# Patient Record
Sex: Male | Born: 1940
Health system: Southern US, Community
[De-identification: ages and names within clinical notes are randomized; demographics above are authoritative.]

## PROBLEM LIST (undated history)

## (undated) DIAGNOSIS — I2510 Atherosclerotic heart disease of native coronary artery without angina pectoris: Secondary | ICD-10-CM

## (undated) DIAGNOSIS — K922 Gastrointestinal hemorrhage, unspecified: Secondary | ICD-10-CM

## (undated) DIAGNOSIS — I499 Cardiac arrhythmia, unspecified: Secondary | ICD-10-CM

## (undated) DIAGNOSIS — I10 Essential (primary) hypertension: Secondary | ICD-10-CM

## (undated) DIAGNOSIS — K279 Peptic ulcer, site unspecified, unspecified as acute or chronic, without hemorrhage or perforation: Secondary | ICD-10-CM

## (undated) DIAGNOSIS — N400 Enlarged prostate without lower urinary tract symptoms: Secondary | ICD-10-CM

## (undated) DIAGNOSIS — M109 Gout, unspecified: Secondary | ICD-10-CM

## (undated) DIAGNOSIS — I4891 Unspecified atrial fibrillation: Secondary | ICD-10-CM

## (undated) DIAGNOSIS — K635 Polyp of colon: Secondary | ICD-10-CM

## (undated) DIAGNOSIS — E785 Hyperlipidemia, unspecified: Secondary | ICD-10-CM

## (undated) HISTORY — PX: COLONOSCOPY: SHX174

## (undated) HISTORY — PX: GASTRIC RESECTION: SHX5248

## (undated) HISTORY — PX: CORONARY ANGIOPLASTY: SHX604

## (undated) HISTORY — PX: APPENDECTOMY: SHX54

## (undated) HISTORY — PX: CORONARY ARTERY BYPASS GRAFT: SHX141

## (undated) HISTORY — PX: FRACTURE SURGERY: SHX138

## (undated) HISTORY — PX: CARDIOVERSION: SHX1299

---

## 1993-03-24 HISTORY — PX: FRACTURE SURGERY: SHX138

## 2005-05-16 ENCOUNTER — Ambulatory Visit: Payer: Self-pay | Admitting: Gastroenterology

## 2008-05-31 ENCOUNTER — Ambulatory Visit: Payer: Self-pay | Admitting: Family Medicine

## 2010-03-22 ENCOUNTER — Ambulatory Visit: Payer: Self-pay | Admitting: Family Medicine

## 2010-12-20 ENCOUNTER — Ambulatory Visit: Payer: Self-pay | Admitting: Gastroenterology

## 2012-03-09 DIAGNOSIS — N411 Chronic prostatitis: Secondary | ICD-10-CM | POA: Insufficient documentation

## 2012-07-14 ENCOUNTER — Ambulatory Visit: Payer: Self-pay | Admitting: Family Medicine

## 2013-07-02 ENCOUNTER — Ambulatory Visit: Payer: Self-pay | Admitting: Physician Assistant

## 2013-11-17 DIAGNOSIS — M19049 Primary osteoarthritis, unspecified hand: Secondary | ICD-10-CM | POA: Insufficient documentation

## 2014-10-30 ENCOUNTER — Other Ambulatory Visit: Payer: Self-pay | Admitting: Family Medicine

## 2014-10-30 DIAGNOSIS — I10 Essential (primary) hypertension: Secondary | ICD-10-CM

## 2014-11-08 ENCOUNTER — Ambulatory Visit
Admission: RE | Admit: 2014-11-08 | Discharge: 2014-11-08 | Disposition: A | Discharge status: Home / Self Care | Payer: Medicare Other | Source: Ambulatory Visit | Attending: Internal Medicine | Admitting: Internal Medicine

## 2014-11-08 ENCOUNTER — Encounter
Admission: RE | Disposition: A | Discharge status: Home / Self Care | Payer: Self-pay | Source: Ambulatory Visit | Attending: Internal Medicine

## 2014-11-08 DIAGNOSIS — R0602 Shortness of breath: Secondary | ICD-10-CM | POA: Insufficient documentation

## 2014-11-08 DIAGNOSIS — Z8249 Family history of ischemic heart disease and other diseases of the circulatory system: Secondary | ICD-10-CM | POA: Insufficient documentation

## 2014-11-08 DIAGNOSIS — I209 Angina pectoris, unspecified: Secondary | ICD-10-CM | POA: Diagnosis present

## 2014-11-08 DIAGNOSIS — E785 Hyperlipidemia, unspecified: Secondary | ICD-10-CM | POA: Diagnosis not present

## 2014-11-08 DIAGNOSIS — Z79899 Other long term (current) drug therapy: Secondary | ICD-10-CM | POA: Diagnosis not present

## 2014-11-08 DIAGNOSIS — I25119 Atherosclerotic heart disease of native coronary artery with unspecified angina pectoris: Secondary | ICD-10-CM | POA: Diagnosis not present

## 2014-11-08 DIAGNOSIS — Z882 Allergy status to sulfonamides status: Secondary | ICD-10-CM | POA: Insufficient documentation

## 2014-11-08 DIAGNOSIS — Z7982 Long term (current) use of aspirin: Secondary | ICD-10-CM | POA: Insufficient documentation

## 2014-11-08 DIAGNOSIS — N400 Enlarged prostate without lower urinary tract symptoms: Secondary | ICD-10-CM | POA: Insufficient documentation

## 2014-11-08 DIAGNOSIS — I10 Essential (primary) hypertension: Secondary | ICD-10-CM | POA: Insufficient documentation

## 2014-11-08 DIAGNOSIS — Z809 Family history of malignant neoplasm, unspecified: Secondary | ICD-10-CM | POA: Insufficient documentation

## 2014-11-08 HISTORY — DX: Hyperlipidemia, unspecified: E78.5

## 2014-11-08 HISTORY — PX: CARDIAC CATHETERIZATION: SHX172

## 2014-11-08 HISTORY — DX: Essential (primary) hypertension: I10

## 2014-11-08 HISTORY — DX: Gout, unspecified: M10.9

## 2014-11-08 HISTORY — DX: Benign prostatic hyperplasia without lower urinary tract symptoms: N40.0

## 2014-11-08 HISTORY — DX: Peptic ulcer, site unspecified, unspecified as acute or chronic, without hemorrhage or perforation: K27.9

## 2014-11-08 HISTORY — DX: Polyp of colon: K63.5

## 2014-11-08 SURGERY — LEFT HEART CATH AND CORONARY ANGIOGRAPHY
Anesthesia: Moderate Sedation

## 2014-11-08 MED ORDER — FENTANYL CITRATE (PF) 100 MCG/2ML IJ SOLN
INTRAMUSCULAR | Status: DC | PRN
  Administered 2014-11-08: 50 ug via INTRAVENOUS

## 2014-11-08 MED ORDER — MIDAZOLAM HCL 2 MG/2ML IJ SOLN
INTRAMUSCULAR | Status: DC | PRN
  Administered 2014-11-08: 1 mg via INTRAVENOUS

## 2014-11-08 MED ORDER — FENTANYL CITRATE (PF) 100 MCG/2ML IJ SOLN
INTRAMUSCULAR | Status: AC
  Filled 2014-11-08: qty 2

## 2014-11-08 MED ORDER — SODIUM CHLORIDE 0.9 % IJ SOLN
3.0000 mL | Freq: Two times a day (BID) | INTRAMUSCULAR | Status: DC
  Administered 2014-11-08: 13:00:00 via INTRAVENOUS

## 2014-11-08 MED ORDER — SODIUM CHLORIDE 0.9 % IV SOLN: 250.0000 mL | INTRAVENOUS | Status: DC | PRN

## 2014-11-08 MED ORDER — HEPARIN (PORCINE) IN NACL 2-0.9 UNIT/ML-% IJ SOLN
INTRAMUSCULAR | Status: AC
  Filled 2014-11-08: qty 1000

## 2014-11-08 MED ORDER — MIDAZOLAM HCL 2 MG/2ML IJ SOLN
INTRAMUSCULAR | Status: AC
  Filled 2014-11-08: qty 2

## 2014-11-08 MED ORDER — IOHEXOL 300 MG/ML  SOLN
INTRAMUSCULAR | Status: DC | PRN
  Administered 2014-11-08: 90 mL via INTRA_ARTERIAL
  Administered 2014-11-08: 30 mL via INTRA_ARTERIAL

## 2014-11-08 MED ORDER — SODIUM CHLORIDE 0.9 % IV SOLN
INTRAVENOUS | Status: DC
  Administered 2014-11-08: 13:00:00 via INTRAVENOUS

## 2014-11-08 SURGICAL SUPPLY — 9 items
CATH INFINITI 5FR ANG PIGTAIL (CATHETERS) ×2 IMPLANT
CATH INFINITI 5FR JL4 (CATHETERS) ×2 IMPLANT
CATH INFINITI JR4 5F (CATHETERS) ×2 IMPLANT
DEVICE CLOSURE MYNXGRIP 5F (Vascular Products) ×2 IMPLANT
KIT MANI 3VAL PERCEP (MISCELLANEOUS) ×2 IMPLANT
NEEDLE PERC 18GX7CM (NEEDLE) ×2 IMPLANT
PACK CARDIAC CATH (CUSTOM PROCEDURE TRAY) ×2 IMPLANT
SHEATH AVANTI 5FR X 11CM (SHEATH) ×2 IMPLANT
WIRE EMERALD 3MM-J .035X150CM (WIRE) ×2 IMPLANT

## 2014-11-08 NOTE — Discharge Instructions (Signed)
next two days after your procedure.  Avoid stooping, bending, heavy lifting or exertion as this may put pressure on the insertion site.  Resume normal activities in 48 hours.  You may shower after 24 hours but avoid excessive warm water and do not scrub the site.  Remove clear dressing in 48 hours.  If you have had a closure device inserted, do not soak in a tub bath or a hot tub for at least one week.  No driving for 48 hours after discharge.  After the procedure, check the insertion site occasionally.  If any oozing occurs or there is apparent swelling, firm pressure over the site will prevent a bruise from forming.  You can not hurt anything by pressing directly on the site.  The pressure stops the bleeding by allowing a small clot to form.  If the bleeding continues after the pressure has been applied for more than 15 minutes, call 911 or go to the nearest emergency room.

## 2014-11-22 ENCOUNTER — Encounter: Payer: Self-pay | Admitting: Family Medicine

## 2014-11-22 ENCOUNTER — Ambulatory Visit (INDEPENDENT_AMBULATORY_CARE_PROVIDER_SITE_OTHER): Payer: Medicare Other | Admitting: Family Medicine

## 2014-11-22 VITALS — BP 120/70 | HR 64 | Ht 71.0 in | Wt 157.0 lb

## 2014-11-22 DIAGNOSIS — L259 Unspecified contact dermatitis, unspecified cause: Secondary | ICD-10-CM | POA: Diagnosis not present

## 2014-11-22 DIAGNOSIS — Z23 Encounter for immunization: Secondary | ICD-10-CM

## 2014-11-22 DIAGNOSIS — Z79899 Other long term (current) drug therapy: Secondary | ICD-10-CM | POA: Diagnosis not present

## 2014-11-22 MED ORDER — TRIAMCINOLONE ACETONIDE 0.1 % EX CREA: 1.0000 "application " | TOPICAL_CREAM | Freq: Two times a day (BID) | CUTANEOUS | Status: DC

## 2014-11-22 NOTE — Progress Notes (Signed)
Name: Stuart Jenkins   MRN: [id number]    DOB: Aug 14, 1940   Date:11/22/2014       Progress Note  Subjective  Chief Complaint  Chief Complaint  Patient presents with  . Rash    started about 2 days after starting new med- Imdur- itches, on legs    Rash This is a new problem. The current episode started 1 to 4 weeks ago. The problem is unchanged. The affected locations include the right lowerleg and left lower leg. The rash is characterized by itchiness and redness. He was exposed to a new medication. Pertinent negatives include no anorexia, congestion, cough, diarrhea, eye pain, facial edema, fatigue, fever, joint pain, nail changes, rhinorrhea, shortness of breath, sore throat or vomiting. Past treatments include nothing. The treatment provided no relief. There is no history of allergies, asthma, eczema or varicella.    No problem-specific assessment & plan notes found for this encounter.   Past Medical History  Diagnosis Date  . Gout   . Hyperlipidemia   . Hypertension   . Colon polyp   . BPH (benign prostatic hypertrophy)   . Peptic ulcer disease     Past Surgical History  Procedure Laterality Date  . Appendectomy    . Gastric resection      partial  . Fracture surgery Left     arm, knee, collar bone  . Cardiac catheterization N/A 11/08/2014    Procedure: Left Heart Cath and Coronary Angiography;  Surgeon: Yolonda Kida, MD;  Location: [place] [facility];  Service: Cardiovascular;  Laterality: N/A;    History reviewed. No pertinent family history.  Social History   Social History  . Marital Status: Married    Spouse Name: N/A  . Number of Children: N/A  . Years of Education: N/A   Occupational History  . Not on file.   Social History Main Topics  . Smoking status: Former Smoker    Quit date: 11/07/1984  . Smokeless tobacco: Not on file  . Alcohol Use: Yes     Comment: less than 1 drink/week  . Drug Use: No  . Sexual Activity: Yes   Other Topics  Concern  . Not on file   Social History Narrative    Allergies  Allergen Reactions  . Sulfa Antibiotics      Review of Systems  Constitutional: Negative for fever, chills, weight loss, malaise/fatigue and fatigue.  HENT: Negative for congestion, ear discharge, ear pain, rhinorrhea and sore throat.   Eyes: Negative for blurred vision and pain.  Respiratory: Negative for cough, sputum production, shortness of breath and wheezing.   Cardiovascular: Negative for chest pain, palpitations and leg swelling.  Gastrointestinal: Negative for heartburn, nausea, vomiting, abdominal pain, diarrhea, constipation, blood in stool, melena and anorexia.  Genitourinary: Negative for dysuria, urgency, frequency and hematuria.  Musculoskeletal: Negative for myalgias, back pain, joint pain and neck pain.  Skin: Positive for rash. Negative for nail changes.  Neurological: Negative for dizziness, tingling, sensory change, focal weakness and headaches.  Endo/Heme/Allergies: Negative for environmental allergies and polydipsia. Does not bruise/bleed easily.  Psychiatric/Behavioral: Negative for depression and suicidal ideas. The patient is not nervous/anxious and does not have insomnia.      Objective  Filed Vitals:   11/22/14 0858  BP: 120/70  Pulse: 64  Height: 5\' 11"  (1.803 m)  Weight: 157 lb (71.215 kg)    Physical Exam  Constitutional: He is oriented to person, place, and time and well-developed, well-nourished, and in no distress.  HENT:  Head: Normocephalic.  Right Ear: External ear normal.  Left Ear: External ear normal.  Nose: Nose normal.  Mouth/Throat: Oropharynx is clear and moist.  Eyes: Conjunctivae and EOM are normal. Pupils are equal, round, and reactive to light. Right eye exhibits no discharge. Left eye exhibits no discharge. No scleral icterus.  Neck: Normal range of motion. Neck supple. No JVD present. No tracheal deviation present. No thyromegaly present.  Cardiovascular:  Normal rate, regular rhythm, normal heart sounds and intact distal pulses.  Exam reveals no gallop and no friction rub.   No murmur heard. Pulmonary/Chest: Breath sounds normal. No respiratory distress. He has no wheezes. He has no rales.  Abdominal: Soft. Bowel sounds are normal. He exhibits no mass. There is no hepatosplenomegaly. There is no tenderness. There is no rebound, no guarding and no CVA tenderness.  Musculoskeletal: Normal range of motion. He exhibits no edema or tenderness.  Lymphadenopathy:    He has no cervical adenopathy.  Neurological: He is alert and oriented to person, place, and time. He has normal sensation, normal strength, normal reflexes and intact cranial nerves. No cranial nerve deficit.  Skin: Skin is warm. Rash noted. Rash is maculopapular. There is erythema.     Psychiatric: Mood and affect normal.      Assessment & Plan  Problem List Items Addressed This Visit    None    Visit Diagnoses    Contact dermatitis    -  Primary    possible insect involvement/ zyrtec recommended    Relevant Medications    triamcinolone cream (KENALOG) 0.1 %    Medication course changed        by pt Imdur/may resume    Influenza vaccine needed        Relevant Orders    Flu Vaccine QUAD 36+ mos PF IM (Fluarix & Fluzone Quad PF) (Completed)         Dr. Deanna Jones [facility]  11/22/2014

## 2014-12-05 HISTORY — PX: OTHER SURGICAL HISTORY: SHX169

## 2014-12-06 DIAGNOSIS — F17210 Nicotine dependence, cigarettes, uncomplicated: Secondary | ICD-10-CM | POA: Insufficient documentation

## 2014-12-07 DIAGNOSIS — G8918 Other acute postprocedural pain: Secondary | ICD-10-CM | POA: Insufficient documentation

## 2014-12-23 ENCOUNTER — Emergency Department: Payer: Medicare Other

## 2014-12-23 ENCOUNTER — Observation Stay
Admission: EM | Admit: 2014-12-23 | Discharge: 2014-12-24 | Disposition: A | Discharge status: Home / Self Care | Payer: Medicare Other | Attending: Internal Medicine | Admitting: Internal Medicine

## 2014-12-23 ENCOUNTER — Encounter: Payer: Self-pay | Admitting: Emergency Medicine

## 2014-12-23 DIAGNOSIS — M109 Gout, unspecified: Secondary | ICD-10-CM | POA: Diagnosis not present

## 2014-12-23 DIAGNOSIS — R739 Hyperglycemia, unspecified: Secondary | ICD-10-CM | POA: Diagnosis not present

## 2014-12-23 DIAGNOSIS — Z7901 Long term (current) use of anticoagulants: Secondary | ICD-10-CM | POA: Insufficient documentation

## 2014-12-23 DIAGNOSIS — R000 Tachycardia, unspecified: Secondary | ICD-10-CM | POA: Insufficient documentation

## 2014-12-23 DIAGNOSIS — Z7982 Long term (current) use of aspirin: Secondary | ICD-10-CM | POA: Insufficient documentation

## 2014-12-23 DIAGNOSIS — Z882 Allergy status to sulfonamides status: Secondary | ICD-10-CM | POA: Insufficient documentation

## 2014-12-23 DIAGNOSIS — I4891 Unspecified atrial fibrillation: Secondary | ICD-10-CM

## 2014-12-23 DIAGNOSIS — I2510 Atherosclerotic heart disease of native coronary artery without angina pectoris: Secondary | ICD-10-CM | POA: Diagnosis not present

## 2014-12-23 DIAGNOSIS — R079 Chest pain, unspecified: Secondary | ICD-10-CM | POA: Insufficient documentation

## 2014-12-23 DIAGNOSIS — R42 Dizziness and giddiness: Secondary | ICD-10-CM | POA: Diagnosis not present

## 2014-12-23 DIAGNOSIS — D649 Anemia, unspecified: Secondary | ICD-10-CM | POA: Diagnosis not present

## 2014-12-23 DIAGNOSIS — I517 Cardiomegaly: Secondary | ICD-10-CM | POA: Insufficient documentation

## 2014-12-23 DIAGNOSIS — Z86010 Personal history of colonic polyps: Secondary | ICD-10-CM | POA: Insufficient documentation

## 2014-12-23 DIAGNOSIS — Z885 Allergy status to narcotic agent status: Secondary | ICD-10-CM | POA: Insufficient documentation

## 2014-12-23 DIAGNOSIS — Z951 Presence of aortocoronary bypass graft: Secondary | ICD-10-CM | POA: Insufficient documentation

## 2014-12-23 DIAGNOSIS — R339 Retention of urine, unspecified: Secondary | ICD-10-CM | POA: Diagnosis not present

## 2014-12-23 DIAGNOSIS — Z87891 Personal history of nicotine dependence: Secondary | ICD-10-CM | POA: Insufficient documentation

## 2014-12-23 DIAGNOSIS — N400 Enlarged prostate without lower urinary tract symptoms: Secondary | ICD-10-CM | POA: Diagnosis not present

## 2014-12-23 DIAGNOSIS — E785 Hyperlipidemia, unspecified: Secondary | ICD-10-CM | POA: Insufficient documentation

## 2014-12-23 DIAGNOSIS — J90 Pleural effusion, not elsewhere classified: Secondary | ICD-10-CM | POA: Diagnosis not present

## 2014-12-23 DIAGNOSIS — I959 Hypotension, unspecified: Secondary | ICD-10-CM | POA: Diagnosis not present

## 2014-12-23 DIAGNOSIS — Z8711 Personal history of peptic ulcer disease: Secondary | ICD-10-CM | POA: Diagnosis not present

## 2014-12-23 DIAGNOSIS — R55 Syncope and collapse: Principal | ICD-10-CM | POA: Diagnosis present

## 2014-12-23 DIAGNOSIS — R531 Weakness: Secondary | ICD-10-CM | POA: Diagnosis not present

## 2014-12-23 DIAGNOSIS — Z79899 Other long term (current) drug therapy: Secondary | ICD-10-CM | POA: Diagnosis not present

## 2014-12-23 DIAGNOSIS — N139 Obstructive and reflux uropathy, unspecified: Secondary | ICD-10-CM | POA: Diagnosis not present

## 2014-12-23 DIAGNOSIS — I10 Essential (primary) hypertension: Secondary | ICD-10-CM | POA: Diagnosis not present

## 2014-12-23 DIAGNOSIS — R918 Other nonspecific abnormal finding of lung field: Secondary | ICD-10-CM | POA: Insufficient documentation

## 2014-12-23 DIAGNOSIS — R0602 Shortness of breath: Secondary | ICD-10-CM | POA: Diagnosis not present

## 2014-12-23 DIAGNOSIS — E871 Hypo-osmolality and hyponatremia: Secondary | ICD-10-CM | POA: Diagnosis not present

## 2014-12-23 LAB — CBC WITH DIFFERENTIAL/PLATELET
BASOS ABS: 0 10*3/uL (ref 0–0.1)
BASOS PCT: 1 %
EOS PCT: 3 %
Eosinophils Absolute: 0.1 10*3/uL (ref 0–0.7)
HCT: 31.7 % — ABNORMAL LOW (ref 40.0–52.0)
Hemoglobin: 10.3 g/dL — ABNORMAL LOW (ref 13.0–18.0)
Lymphocytes Relative: 19 %
Lymphs Abs: 0.8 10*3/uL — ABNORMAL LOW (ref 1.0–3.6)
MCH: 29.5 pg (ref 26.0–34.0)
MCHC: 32.6 g/dL (ref 32.0–36.0)
MCV: 90.5 fL (ref 80.0–100.0)
MONO ABS: 0.5 10*3/uL (ref 0.2–1.0)
Monocytes Relative: 12 %
Neutro Abs: 2.8 10*3/uL (ref 1.4–6.5)
Neutrophils Relative %: 65 %
PLATELETS: 384 10*3/uL (ref 150–440)
RBC: 3.51 MIL/uL — ABNORMAL LOW (ref 4.40–5.90)
RDW: 13.8 % (ref 11.5–14.5)
WBC: 4.3 10*3/uL (ref 3.8–10.6)

## 2014-12-23 LAB — BASIC METABOLIC PANEL
ANION GAP: 4 — AB (ref 5–15)
BUN: 14 mg/dL (ref 6–20)
CALCIUM: 7.8 mg/dL — AB (ref 8.9–10.3)
CO2: 24 mmol/L (ref 22–32)
CREATININE: 1.11 mg/dL (ref 0.61–1.24)
Chloride: 106 mmol/L (ref 101–111)
GLUCOSE: 123 mg/dL — AB (ref 65–99)
Potassium: 3.9 mmol/L (ref 3.5–5.1)
Sodium: 134 mmol/L — ABNORMAL LOW (ref 135–145)

## 2014-12-23 LAB — URINALYSIS COMPLETE WITH MICROSCOPIC (ARMC ONLY)
BACTERIA UA: NONE SEEN
Bilirubin Urine: NEGATIVE
GLUCOSE, UA: NEGATIVE mg/dL
Hgb urine dipstick: NEGATIVE
Ketones, ur: NEGATIVE mg/dL
Leukocytes, UA: NEGATIVE
NITRITE: NEGATIVE
Protein, ur: NEGATIVE mg/dL
SPECIFIC GRAVITY, URINE: 1.018 (ref 1.005–1.030)
Squamous Epithelial / LPF: NONE SEEN
pH: 6 (ref 5.0–8.0)

## 2014-12-23 LAB — TROPONIN I: Troponin I: <0.03 ng/mL (ref <0.031)

## 2014-12-23 MED ORDER — ADULT MULTIVITAMIN W/MINERALS CH
1.0000 | ORAL_TABLET | Freq: Every day | ORAL | Status: DC
  Administered 2014-12-24: 1 via ORAL
  Filled 2014-12-23: qty 1

## 2014-12-23 MED ORDER — AMIODARONE HCL 200 MG PO TABS
200.0000 mg | ORAL_TABLET | Freq: Every day | ORAL | Status: DC
  Administered 2014-12-24: 200 mg via ORAL
  Filled 2014-12-23: qty 1

## 2014-12-23 MED ORDER — FERROUS SULFATE 325 (65 FE) MG PO TABS
324.0000 mg | ORAL_TABLET | Freq: Two times a day (BID) | ORAL | Status: DC
  Filled 2014-12-23: qty 1

## 2014-12-23 MED ORDER — FERROUS SULFATE 325 (65 FE) MG PO TABS
325.0000 mg | ORAL_TABLET | Freq: Two times a day (BID) | ORAL | Status: DC
  Administered 2014-12-23 – 2014-12-24 (×2): 325 mg via ORAL
  Filled 2014-12-23: qty 1

## 2014-12-23 MED ORDER — ACETAMINOPHEN 650 MG RE SUPP: 650.0000 mg | Freq: Four times a day (QID) | RECTAL | Status: DC | PRN

## 2014-12-23 MED ORDER — ATORVASTATIN CALCIUM 20 MG PO TABS
40.0000 mg | ORAL_TABLET | Freq: Every day | ORAL | Status: DC
  Administered 2014-12-23: 40 mg via ORAL
  Filled 2014-12-23: qty 2

## 2014-12-23 MED ORDER — RIVAROXABAN 20 MG PO TABS: 20.0000 mg | ORAL_TABLET | Freq: Every day | ORAL | Status: DC

## 2014-12-23 MED ORDER — ENOXAPARIN SODIUM 40 MG/0.4ML ~~LOC~~ SOLN: 40.0000 mg | SUBCUTANEOUS | Status: DC

## 2014-12-23 MED ORDER — ASPIRIN EC 81 MG PO TBEC
81.0000 mg | DELAYED_RELEASE_TABLET | Freq: Every day | ORAL | Status: DC
  Administered 2014-12-24: 81 mg via ORAL
  Filled 2014-12-23: qty 1

## 2014-12-23 MED ORDER — MELATONIN 3 MG PO TABS: 3.0000 mg | ORAL_TABLET | Freq: Every evening | ORAL | Status: DC | PRN

## 2014-12-23 MED ORDER — FOLIC ACID 1 MG PO TABS
1.0000 mg | ORAL_TABLET | Freq: Every day | ORAL | Status: DC
  Administered 2014-12-24: 1 mg via ORAL
  Filled 2014-12-23: qty 1

## 2014-12-23 MED ORDER — ACETAMINOPHEN 325 MG PO TABS: 650.0000 mg | ORAL_TABLET | Freq: Four times a day (QID) | ORAL | Status: DC | PRN

## 2014-12-23 MED ORDER — SODIUM CHLORIDE 0.9 % IV SOLN
INTRAVENOUS | Status: DC
  Administered 2014-12-23: 19:00:00 via INTRAVENOUS

## 2014-12-23 MED ORDER — TAMSULOSIN HCL 0.4 MG PO CAPS
0.8000 mg | ORAL_CAPSULE | Freq: Every day | ORAL | Status: DC
  Administered 2014-12-24: 0.8 mg via ORAL
  Filled 2014-12-23: qty 2

## 2014-12-23 MED ORDER — SODIUM CHLORIDE 0.9 % IJ SOLN: 3.0000 mL | Freq: Two times a day (BID) | INTRAMUSCULAR | Status: DC

## 2014-12-23 NOTE — Progress Notes (Signed)
PHARMACIST - PHYSICIAN ORDER COMMUNICATION  CONCERNING: P&T Medication Policy on Herbal Medications  DESCRIPTION:  This patient's order for:  Melatonin  has been noted.  This product(s) is classified as an "herbal" or natural product. Due to a lack of definitive safety studies or FDA approval, nonstandard manufacturing practices, plus the potential risk of unknown drug-drug interactions while on inpatient medications, the Pharmacy and Therapeutics Committee does not permit the use of "herbal" or natural products of this type within [facility].   ACTION TAKEN: The pharmacy department is unable to verify this order at this time and your patient has been informed of this safety policy. Please reevaluate patient's clinical condition at discharge and address if the herbal or natural product(s) should be resumed at that time.  Paulina Fusi, PharmD, BCPS 12/23/2014 6:47 PM

## 2014-12-23 NOTE — Progress Notes (Signed)
Anticoag Monitoring: Patient is ordered Lovenox 40mg  SQ q24h for DVT prevention. Also ordered Xarelto 20mg  PO daily as continuation of home medication. Per policy, will discontinue the Lovenox as patient is fully anticoagulated on Xarelto.  Paulina Fusi, PharmD, BCPS 12/23/2014 6:50 PM

## 2014-12-23 NOTE — ED Notes (Signed)
2A notified patient is on his way with the ED Nurse Tech

## 2014-12-23 NOTE — ED Notes (Signed)
Pt arrived via EMS from home. Pt states he was walking in his house and felt dizzy and weak and was seeing black spots when walking. Pt reports that he walked over to the table after going to the bathroom and leaned over the table.  When he came to he was still standing on the table and his wife was standing over him yelling.  Pt states he did not fall. Recent CABG at [facility] on 9/13. Foley catheter in place since surgery for chronic retention. Lives at home with wife. Packing noted to right groin as well as midline incision that is dry and intact. Minimal redness.

## 2014-12-23 NOTE — ED Provider Notes (Signed)
[facility] Emergency Department Provider Note  ____________________________________________  Time seen: Seen upon arrival to the emergency department  I have reviewed the triage vital signs and the nursing notes.   HISTORY  Chief Complaint Loss of Consciousness    HPI Stuart Jenkins is a 74 y.o. male with a history of coronary artery bypass grafting several weeks ago at [facility] on September 13 was presenting today with an episode of syncope. He said that he was walking back from the bathroom and started to feel lightheaded. He said that he managed to clean himself over his kitchen table and call for his wife. He said that he woke up with his wife standing over him yelling while he was still lying on the kitchen table. He believes he was unconscious for several seconds. He denies any chest pain. No shortness of breath. On scene he was hypotensive to the 60s which resolved into the 90s by the time he was in the emergency department. He says that he feels back to his baseline. Continues to deny any chest pain or shortness of breath. Dr. Clayborn Bigness is his cardiologist.   Past Medical History  Diagnosis Date  . Gout   . Hyperlipidemia   . Hypertension   . Colon polyp   . BPH (benign prostatic hypertrophy)   . Peptic ulcer disease     There are no active problems to display for this patient.   Past Surgical History  Procedure Laterality Date  . Appendectomy    . Gastric resection      partial  . Fracture surgery Left     arm, knee, collar bone  . Cardiac catheterization N/A 11/08/2014    Procedure: Left Heart Cath and Coronary Angiography;  Surgeon: Yolonda Kida, MD;  Location: [place] [facility];  Service: Cardiovascular;  Laterality: N/A;    Current Outpatient Rx  Name  Route  Sig  Dispense  Refill  . amLODipine (NORVASC) 5 MG tablet      TAKE 1 TABLET BY MOUTH DAILY   30 tablet   1   . aspirin EC 81 MG tablet   Oral   Take 81 mg by mouth  daily.         Marland Kitchen atorvastatin (LIPITOR) 80 MG tablet   Oral   Take 80 mg by mouth daily.         . isosorbide mononitrate (IMDUR) 30 MG 24 hr tablet   Oral   Take 30 mg by mouth daily.         Marland Kitchen losartan (COZAAR) 50 MG tablet      TAKE 1 TABLET BY MOUTH DAILY   30 tablet   1   . tamsulosin (FLOMAX) 0.4 MG CAPS capsule   Oral   Take 0.4 mg by mouth daily.         Marland Kitchen triamcinolone cream (KENALOG) 0.1 %   Topical   Apply 1 application topically 2 (two) times daily.   30 g   2     Allergies Sulfa antibiotics  History reviewed. No pertinent family history.  Social History Social History  Substance Use Topics  . Smoking status: Former Smoker    Quit date: 11/07/1984  . Smokeless tobacco: None  . Alcohol Use: Yes     Comment: less than 1 drink/week    Review of Systems Constitutional: No fever/chills Eyes: No visual changes. ENT: No sore throat. Cardiovascular: Denies chest pain. Respiratory: Denies shortness of breath. Gastrointestinal: No abdominal pain.  No nausea, no vomiting.  No diarrhea.  No constipation. Genitourinary: Negative for dysuria. Musculoskeletal: Negative for back pain. Skin: Negative for rash. Neurological: Negative for headaches, focal weakness or numbness.  10-point ROS otherwise negative.  ____________________________________________   PHYSICAL EXAM:  VITAL SIGNS: ED Triage Vitals  Enc Vitals Group     BP 12/23/14 1530 91/59 mmHg     Pulse Rate 12/23/14 1530 89     Resp 12/23/14 1530 21     Temp 12/23/14 1530 97.5 F (36.4 C)     Temp Source 12/23/14 1530 Oral     SpO2 12/23/14 1530 100 %     Weight 12/23/14 1530 161 lb (73.029 kg)     Height 12/23/14 1530 5\' 11"  (1.803 m)     Head Cir --      Peak Flow --      Pain Score 12/23/14 1532 0     Pain Loc --      Pain Edu? --      Excl. in [place]? --     Constitutional: Alert and oriented. Well appearing and in no acute distress. Eyes: Conjunctivae are normal. PERRL.  EOMI. Head: Atraumatic. Nose: No congestion/rhinnorhea. Mouth/Throat: Mucous membranes are moist.  Oropharynx non-erythematous. Neck: No stridor.   Cardiovascular: Normal rate, regular rhythm. Grossly normal heart sounds.  Good peripheral circulation. Respiratory: Normal respiratory effort.  No retractions. Lungs CTAB. Gastrointestinal: Soft and nontender. No distention. No abdominal bruits. No CVA tenderness. Musculoskeletal: No lower extremity tenderness nor edema.  No joint effusions. Neurologic:  Normal speech and language. No gross focal neurologic deficits are appreciated. No gait instability. Skin:  Skin is warm, dry and intact. Well healing midline sternotomy scar. Also well-healing incisions to the vein graft to the right thigh. Psychiatric: Mood and affect are normal. Speech and behavior are normal.  ____________________________________________   LABS (all labs ordered are listed, but only abnormal results are displayed)  Labs Reviewed  CBC WITH DIFFERENTIAL/PLATELET - Abnormal; Notable for the following:    RBC 3.51 (*)    Hemoglobin 10.3 (*)    HCT 31.7 (*)    Lymphs Abs 0.8 (*)    All other components within normal limits  BASIC METABOLIC PANEL - Abnormal; Notable for the following:    Sodium 134 (*)    Glucose, Bld 123 (*)    Calcium 7.8 (*)    Anion gap 4 (*)    All other components within normal limits  TROPONIN I   ____________________________________________  EKG  ED ECG REPORT I, Doran Stabler, the attending physician, personally viewed and interpreted this ECG.   Date: 12/23/2014  EKG Time: 1524  Rate: 83  Rhythm: atrial fibrillation, rate 83  Axis: Normal axis  Intervals:none  ST&T Change: No ST segment elevation or depression. T-wave inversion in 2, 3 and aVF. No previous for comparison on the chart.  ____________________________________________  RADIOLOGY  Small area of infiltrate at the left  base. ____________________________________________   PROCEDURES   ____________________________________________   INITIAL IMPRESSION / ASSESSMENT AND PLAN / ED COURSE  Pertinent labs & imaging results that were available during my care of the patient were reviewed by me and considered in my medical decision making (see chart for details).  ----------------------------------------- 4:14 PM on 12/23/2014 -----------------------------------------  Patient resting comfortably at this time. Blood pressure continues to be in the 90s. Continues to be without any pain at this time. We'll bring him to the hospital for observation for acute syncopal episode. Is  not a low-risk patient secondary to recent cardiac bypass surgery as well as hypotensive on the scene. Signed out to Dr. Wynetta Emery. Discussed the plan as well as lab and imaging results with the patient. He was red evident infiltrate on his left side. However, he says that he also had a recent pneumothorax on that side. He denies any cough or fever at home. He has no clinical signs of pneumonia or lung infection. I believe this is likely due to his recent procedures. Will not start on antibiotics. ____________________________________________   FINAL CLINICAL IMPRESSION(S) / ED DIAGNOSES  Acute syncope. Initial visit.    Orbie Pyo, MD 12/23/14 [phone]

## 2014-12-23 NOTE — H&P (Signed)
Stuart Jenkins is an 74 y.o. male.   Chief Complaint: Passing out HPI: Presents after passing out at home. Says he got up to go to the bathroom and felt dizzy. On his way back he felt worse then the next thing he remembers is waking up on the floor with his wife yelling his name. No chest pain, no shortness of breath. He recently had GABG at [facility] about 3 weeks ago. He has had a foley in since for urinary retension.  Past Medical History  Diagnosis Date  . Gout   . Hyperlipidemia   . Hypertension   . Colon polyp   . BPH (benign prostatic hypertrophy)   . Peptic ulcer disease     Past Surgical History  Procedure Laterality Date  . Appendectomy    . Gastric resection      partial  . Fracture surgery Left     arm, knee, collar bone  . Cardiac catheterization N/A 11/08/2014    Procedure: Left Heart Cath and Coronary Angiography;  Surgeon: Yolonda Kida, MD;  Location: [place] [facility];  Service: Cardiovascular;  Laterality: N/A;    History reviewed. No pertinent family history.  Positive for CAD  Social History:  reports that he quit smoking about 30 years ago. He does not have any smokeless tobacco history on file. He reports that he drinks alcohol. He reports that he does not use illicit drugs.  Allergies:  Allergies  Allergen Reactions  . Sulfa Antibiotics Rash     (Not in a hospital admission)  Results for orders placed or performed during the hospital encounter of 12/23/14 (from the past 48 hour(s))  CBC with Differential     Status: Abnormal   Collection Time: 12/23/14  3:33 PM  Result Value Ref Range   WBC 4.3 3.8 - 10.6 K/uL   RBC 3.51 (L) 4.40 - 5.90 MIL/uL   Hemoglobin 10.3 (L) 13.0 - 18.0 g/dL   HCT 31.7 (L) 40.0 - 52.0 %   MCV 90.5 80.0 - 100.0 fL   MCH 29.5 26.0 - 34.0 pg   MCHC 32.6 32.0 - 36.0 g/dL   RDW 13.8 11.5 - 14.5 %   Platelets 384 150 - 440 K/uL   Neutrophils Relative % 65 %   Neutro Abs 2.8 1.4 - 6.5 K/uL   Lymphocytes Relative 19 %    Lymphs Abs 0.8 (L) 1.0 - 3.6 K/uL   Monocytes Relative 12 %   Monocytes Absolute 0.5 0.2 - 1.0 K/uL   Eosinophils Relative 3 %   Eosinophils Absolute 0.1 0 - 0.7 K/uL   Basophils Relative 1 %   Basophils Absolute 0.0 0 - 0.1 K/uL  Basic metabolic panel     Status: Abnormal   Collection Time: 12/23/14  3:33 PM  Result Value Ref Range   Sodium 134 (L) 135 - 145 mmol/L   Potassium 3.9 3.5 - 5.1 mmol/L   Chloride 106 101 - 111 mmol/L   CO2 24 22 - 32 mmol/L   Glucose, Bld 123 (H) 65 - 99 mg/dL   BUN 14 6 - 20 mg/dL   Creatinine, Ser 1.11 0.61 - 1.24 mg/dL   Calcium 7.8 (L) 8.9 - 10.3 mg/dL   GFR calc non Af Amer >60 >60 mL/min   GFR calc Af Amer >60 >60 mL/min    Comment: (NOTE) The eGFR has been calculated using the CKD EPI equation. This calculation has not been validated in all clinical situations. eGFR's persistently <60 mL/min  signify possible Chronic Kidney Disease.    Anion gap 4 (L) 5 - 15  Troponin I     Status: None   Collection Time: 12/23/14  3:33 PM  Result Value Ref Range   Troponin I <0.03 <0.031 ng/mL    Comment:        NO INDICATION OF MYOCARDIAL INJURY.    Dg Chest 1 View  12/23/2014   CLINICAL DATA:  Dizziness and syncope  EXAM: CHEST 1 VIEW  COMPARISON:  March 22, 2010  FINDINGS: There is a small area of infiltrate in the left base. Lungs elsewhere clear. Heart size and pulmonary vascularity are within normal limits. Patient is status post coronary artery bypass grafting. No adenopathy. There is an old healed fracture of the left clavicle. There are surgical clips at the gastroesophageal junction region.  IMPRESSION: Small area of infiltrate left base. Lungs otherwise clear. Followup PA and lateral chest radiographs recommended in 3-4 weeks following trial of antibiotic therapy to ensure resolution and exclude underlying malignancy.   Electronically Signed   By: Lowella Grip III M.D.   On: 12/23/2014 15:55    Review of Systems  Constitutional:  Negative for fever and chills.  HENT: Negative for hearing loss.   Eyes: Negative for blurred vision.  Respiratory: Negative for shortness of breath.   Cardiovascular: Positive for chest pain.  Gastrointestinal: Negative for nausea, vomiting and diarrhea.  Genitourinary: Negative for dysuria.  Musculoskeletal: Negative for back pain and joint pain.  Skin: Negative for rash.  Neurological: Negative for sensory change.    Blood pressure 91/59, pulse 89, temperature 97.5 F (36.4 C), temperature source Oral, resp. rate 21, height _0  (1.803 m), weight 73.029 kg (161 lb), SpO2 100 %. Physical Exam  Constitutional: He is oriented to person, place, and time. He appears well-developed and well-nourished. No distress.  HENT:  Head: Normocephalic and atraumatic.  Mouth/Throat: Oropharynx is clear and moist. No oropharyngeal exudate.  Eyes: EOM are normal. Pupils are equal, round, and reactive to light. No scleral icterus.  Neck: Neck supple. No JVD present. No tracheal deviation present. No thyromegaly present.  Cardiovascular: Regular rhythm.   No murmur heard. Respiratory:  Clear to ascultation No dullness to percussion No use of accessary muscles Recent sternal incisional scar with scabs  GI: Soft. Bowel sounds are normal. He exhibits no mass. There is no tenderness.  Musculoskeletal: He exhibits no edema.  Post op scar down right thigh with packed wound at upper groin area. No exudate.  Lymphadenopathy:    He has no cervical adenopathy.  Neurological: He is alert and oriented to person, place, and time. No cranial nerve deficit.  Skin: Skin is warm and dry. No rash noted. No erythema.     Assessment/Plan 1. Syncope: Suspect vasovagal. However, BP slow to recovery and he is still mildly hypertensive. Will give IVF, hold metoprolol and ambulate in the morning. Will also consult his cardiologist.  2. Hypotension: Holding metoprolol. IVF. Do not see any reason pt would be developing  sepsis, no signs.  3. CAD: S/P CABG. Continue meds but hold metoprolol as above.  4. Urinary Retention: Has foley cath. Check UA.  5. Infiltrate? Portable CXR was read as possible LLL infiltrate. Pt has no other corelating symptoms. Will follow up with PA and Lateral view. Will hold off on abx for now.  Reviewed past medical records.  Time spent= 6mn..Edwina Barth  Malavika Lira D 12/23/2014, 5:05 PM

## 2014-12-24 ENCOUNTER — Observation Stay: Payer: Medicare Other

## 2014-12-24 DIAGNOSIS — D649 Anemia, unspecified: Secondary | ICD-10-CM

## 2014-12-24 DIAGNOSIS — E871 Hypo-osmolality and hyponatremia: Secondary | ICD-10-CM

## 2014-12-24 DIAGNOSIS — I959 Hypotension, unspecified: Secondary | ICD-10-CM

## 2014-12-24 DIAGNOSIS — I4891 Unspecified atrial fibrillation: Secondary | ICD-10-CM

## 2014-12-24 DIAGNOSIS — R739 Hyperglycemia, unspecified: Secondary | ICD-10-CM

## 2014-12-24 LAB — HEMOGLOBIN A1C: Hgb A1c MFr Bld: 5.5 % (ref 4.0–6.0)

## 2014-12-24 LAB — SODIUM: SODIUM: 136 mmol/L (ref 135–145)

## 2014-12-24 LAB — HEMOGLOBIN: Hemoglobin: 9.9 g/dL — ABNORMAL LOW (ref 13.0–18.0)

## 2014-12-24 NOTE — Progress Notes (Signed)
Pt is a&o, VSS, Afib on tele with no complaints of pain or discomfort. Pt ambulated without difficulty and orthostatic negative. Order to D/C pt to home. Discharge instructions given to pt with verbal acknowledgment of understanding. IV and tele removed. PT complained that papers with meds and appointments were left in ED. Called down to ED and they expressed that they did not have any papers of the pt. Pt escorted off unit via wheelchair by nursing.

## 2014-12-24 NOTE — Progress Notes (Signed)
[facility] [place] NAME: Talis Iwan    MR#:  [id number]  DATE OF BIRTH:  09/25/1940  SUBJECTIVE:  CHIEF COMPLAINT:   Chief Complaint  Patient presents with  . Loss of Consciousness   Mr. Stuart Jenkins is a 74 year old Caucasian male with past medical history significant for history of coronary artery disease who underwent coronary artery bypass grafting at the beginning of September 2016 at [facility]. Postoperatively, according to patient's recollection, he had an episode of atrial fibrillation for which she was initiated on amiodarone and metoprolol as well as Xarelto. He presented to the hospital on 12/23/2014 with syncopal episode. He was noted to have hypotension on arrival to emergency room. Patient admits of feeling dizzy just prior to syncopal episode. EKG in the emergency room revealed atrial fibrillation, tachycardic at 110. His cardiac enzymes were negative. Patient admits of some shortness of breath on exertion but no chest pains.  Review of Systems  Constitutional: Negative for fever, chills and weight loss.  HENT: Negative for congestion.   Eyes: Negative for blurred vision and double vision.  Respiratory: Negative for cough, sputum production, shortness of breath and wheezing.   Cardiovascular: Negative for chest pain, palpitations, orthopnea, leg swelling and PND.  Gastrointestinal: Negative for nausea, vomiting, abdominal pain, diarrhea, constipation and blood in stool.  Genitourinary: Negative for dysuria, urgency, frequency and hematuria.  Musculoskeletal: Negative for falls.  Neurological: Negative for dizziness, tremors, focal weakness and headaches.  Endo/Heme/Allergies: Does not bruise/bleed easily.  Psychiatric/Behavioral: Negative for depression. The patient does not have insomnia.     VITAL SIGNS: Blood pressure 99/65, pulse 108, temperature 97.9 F (36.6 C), temperature source Oral, resp.  rate 18, height 5\' 11"  (1.803 m), weight 65.454 kg (144 lb 4.8 oz), SpO2 98 %.  PHYSICAL EXAMINATION:   GENERAL:  74 y.o.-year-old patient lying in the bed with no acute distress.  EYES: Pupils equal, round, reactive to light and accommodation. No scleral icterus. Extraocular muscles intact.  HEENT: Head atraumatic, normocephalic. Oropharynx and nasopharynx clear.  NECK:  Supple, no jugular venous distention. No thyroid enlargement, no tenderness.  LUNGS: Normal breath sounds bilaterally, no wheezing, rales,rhonchi or crepitation. No use of accessory muscles of respiration.  CARDIOVASCULAR: S1, S2 , irregularly irregular. No murmurs, rubs, or gallops.  ABDOMEN: Soft, nontender, nondistended. Bowel sounds present. No organomegaly or mass.  EXTREMITIES: No pedal edema, cyanosis, or clubbing.  NEUROLOGIC: Cranial nerves II through XII are intact. Muscle strength 5/5 in all extremities. Sensation intact. Gait not checked.  PSYCHIATRIC: The patient is alert and oriented x 3.  SKIN: No obvious rash, lesion, or ulcer.   ORDERS/RESULTS REVIEWED:   CBC  Recent Labs Lab 12/23/14 1533  WBC 4.3  HGB 10.3*  HCT 31.7*  PLT 384  MCV 90.5  MCH 29.5  MCHC 32.6  RDW 13.8  LYMPHSABS 0.8*  MONOABS 0.5  EOSABS 0.1  BASOSABS 0.0   ------------------------------------------------------------------------------------------------------------------  Chemistries   Recent Labs Lab 12/23/14 1533  NA 134*  K 3.9  CL 106  CO2 24  GLUCOSE 123*  BUN 14  CREATININE 1.11  CALCIUM 7.8*   ------------------------------------------------------------------------------------------------------------------ estimated creatinine clearance is 54.9 mL/min (by C-G formula based on Cr of 1.11). ------------------------------------------------------------------------------------------------------------------ No results for input(s): TSH, T4TOTAL, T3FREE, THYROIDAB in the last 72 hours.  Invalid input(s):  FREET3  Cardiac Enzymes  Recent Labs Lab 12/23/14 1533  TROPONINI <0.03   ------------------------------------------------------------------------------------------------------------------ Invalid input(s): POCBNP ---------------------------------------------------------------------------------------------------------------  RADIOLOGY: Dg  Chest 1 View  12/23/2014   CLINICAL DATA:  Dizziness and syncope  EXAM: CHEST 1 VIEW  COMPARISON:  March 22, 2010  FINDINGS: There is a small area of infiltrate in the left base. Lungs elsewhere clear. Heart size and pulmonary vascularity are within normal limits. Patient is status post coronary artery bypass grafting. No adenopathy. There is an old healed fracture of the left clavicle. There are surgical clips at the gastroesophageal junction region.  IMPRESSION: Small area of infiltrate left base. Lungs otherwise clear. Followup PA and lateral chest radiographs recommended in 3-4 weeks following trial of antibiotic therapy to ensure resolution and exclude underlying malignancy.   Electronically Signed   By: Lowella Grip III M.D.   On: 12/23/2014 15:55   X-ray Chest Pa And Lateral  12/24/2014   CLINICAL DATA:  Acute chest pain and near syncope. CABG 3 weeks ago.  EXAM: CHEST  2 VIEW  COMPARISON:  12/23/2014 and 03/22/2010 chest radiographs  FINDINGS: Cardiomegaly and CABG changes again noted.  Small bilateral pleural effusions, left-greater-than-right, are noted.  There is no evidence of focal airspace disease, pulmonary edema, suspicious pulmonary nodule/mass or pneumothorax. No acute bony abnormalities are identified.  IMPRESSION: Cardiomegaly, CABG changes and small bilateral pleural effusions, left-greater-than-right.   Electronically Signed   By: Margarette Canada M.D.   On: 12/24/2014 09:40    EKG:  Orders placed or performed during the hospital encounter of 12/23/14  . EKG 12-Lead  . EKG 12-Lead    ASSESSMENT AND PLAN:  Active Problems:    Syncopal episodes  1. Syncope, unclear etiology at this time, although could be related to intolerance of atrial fibrillation and hypertension. Getting orthostatic vital signs. Awaiting for cardiology consultation.  2. Hypotension, likely due to medications, although atrial fibrillation could be blamed as well, off metoprolol at present, checking orthostatic vital signs. Ambulate and follow symptoms 3. A. fib. Continue amiodarone as well as Xarelto. May need to undergo electrical conversion, awaiting for cardiology's consultation 4. Hyponatremia. Follow sodium level today 5. Anemia, followed with rehydration 6. Hyperglycemia. Check hemoglobin A1c   Management plans discussed with the patient, family and they are in agreement.   DRUG ALLERGIES:  Allergies  Allergen Reactions  . Oxycodone Hcl Other (See Comments)    Altered mental status  . Sulfa Antibiotics Rash    CODE STATUS:     Code Status Orders        Start     Ordered   12/23/14 1841  Full code   Continuous     12/23/14 1840      TOTAL TIME TAKING CARE OF THIS PATIENT: 45 minutes.    Theodoro Grist M.D on 12/24/2014 at 10:20 AM  Between 7am to 6pm - Pager - [phone]  After 6pm go to [email or web address] - password [email or web address] [facility]  [facility]  Office  [phone]  CC: Primary care physician; Otilio Miu, MD

## 2014-12-24 NOTE — Progress Notes (Signed)
A&O. Up with one assist. IVF infusing. Foley draining well. Offered to replace leg bag with our facility drainage bags but patient refused. Afib on tele,.

## 2014-12-24 NOTE — Consult Note (Signed)
Reason for Consult: syncope hypotension AFib Referring Physician:   physician hospitalist  HABIB KISE is an 74 y.o. male.  HPI:  Patient known coronary disease history of angina recently had coronary bypass surgery.  Had postoperative atrial fibrillation placed on and then around and Xarelto. Bypass surgery standard [facility] December 08, 2014. Patient also had postoperative collapse along which required a chest tube. Patient also had urinary obstruction requiring a Foley he was discharged home with a leg bag and is to follow up with Urology. Patient states that he has had weakness vertigo fatigued low blood pressure so he came to emergency room for evaluation after a passing out episode. Patient denies any chest pain no shortness of breath denies any leg edema. No nausea vomiting or diarrhea he just complains of generalized weakness denies palpitations or tachycardia so he presented for further evaluation because of in the last weakness. The patient is placement times lucent because of urinary obstruction is on a significant dose of 0.8. No seizure activity was noted. No bleeding. Episode of syncope was preceded by lightheadedness weakness dizziness. He still feels weak and dizzy but does okay while supine.  Past Medical History  Diagnosis Date  . Gout   . Hyperlipidemia   . Hypertension   . Colon polyp   . BPH (benign prostatic hypertrophy)   . Peptic ulcer disease     Past Surgical History  Procedure Laterality Date  . Appendectomy    . Gastric resection      partial  . Fracture surgery Left     arm, knee, collar bone  . Cardiac catheterization N/A 11/08/2014    Procedure: Left Heart Cath and Coronary Angiography;  Surgeon: Yolonda Kida, MD;  Location: [facility];  Service: Cardiovascular;  Laterality: N/A;    History reviewed. No pertinent family history.  Social History:  reports that he quit smoking about 30 years ago. He does not have any smokeless tobacco  history on file. He reports that he drinks alcohol. He reports that he does not use illicit drugs.  Allergies:  Allergies  Allergen Reactions  . Oxycodone Hcl Other (See Comments)    Altered mental status  . Sulfa Antibiotics Rash    Medications: I have reviewed the patient's current medications.  Results for orders placed or performed during the hospital encounter of 12/23/14 (from the past 48 hour(s))  CBC with Differential     Status: Abnormal   Collection Time: 12/23/14  3:33 PM  Result Value Ref Range   WBC 4.3 3.8 - 10.6 K/uL   RBC 3.51 (L) 4.40 - 5.90 MIL/uL   Hemoglobin 10.3 (L) 13.0 - 18.0 g/dL   HCT 31.7 (L) 40.0 - 52.0 %   MCV 90.5 80.0 - 100.0 fL   MCH 29.5 26.0 - 34.0 pg   MCHC 32.6 32.0 - 36.0 g/dL   RDW 13.8 11.5 - 14.5 %   Platelets 384 150 - 440 K/uL   Neutrophils Relative % 65 %   Neutro Abs 2.8 1.4 - 6.5 K/uL   Lymphocytes Relative 19 %   Lymphs Abs 0.8 (L) 1.0 - 3.6 K/uL   Monocytes Relative 12 %   Monocytes Absolute 0.5 0.2 - 1.0 K/uL   Eosinophils Relative 3 %   Eosinophils Absolute 0.1 0 - 0.7 K/uL   Basophils Relative 1 %   Basophils Absolute 0.0 0 - 0.1 K/uL  Basic metabolic panel     Status: Abnormal   Collection Time: 12/23/14  3:33 PM  Result Value Ref Range   Sodium 134 (L) 135 - 145 mmol/L   Potassium 3.9 3.5 - 5.1 mmol/L   Chloride 106 101 - 111 mmol/L   CO2 24 22 - 32 mmol/L   Glucose, Bld 123 (H) 65 - 99 mg/dL   BUN 14 6 - 20 mg/dL   Creatinine, Ser 1.11 0.61 - 1.24 mg/dL   Calcium 7.8 (L) 8.9 - 10.3 mg/dL   GFR calc non Af Amer >60 >60 mL/min   GFR calc Af Amer >60 >60 mL/min    Comment: (NOTE) The eGFR has been calculated using the CKD EPI equation. This calculation has not been validated in all clinical situations. eGFR's persistently <60 mL/min signify possible Chronic Kidney Disease.    Anion gap 4 (L) 5 - 15  Troponin I     Status: None   Collection Time: 12/23/14  3:33 PM  Result Value Ref Range   Troponin I <0.03  <0.031 ng/mL    Comment:        NO INDICATION OF MYOCARDIAL INJURY.   Urinalysis complete, with microscopic ([facility] only)     Status: Abnormal   Collection Time: 12/23/14  5:55 PM  Result Value Ref Range   Color, Urine YELLOW (A) YELLOW   APPearance CLEAR (A) CLEAR   Glucose, UA NEGATIVE NEGATIVE mg/dL   Bilirubin Urine NEGATIVE NEGATIVE   Ketones, ur NEGATIVE NEGATIVE mg/dL   Specific Gravity, Urine 1.018 1.005 - 1.030   Hgb urine dipstick NEGATIVE NEGATIVE   pH 6.0 5.0 - 8.0   Protein, ur NEGATIVE NEGATIVE mg/dL   Nitrite NEGATIVE NEGATIVE   Leukocytes, UA NEGATIVE NEGATIVE   RBC / HPF 0-5 0 - 5 RBC/hpf   WBC, UA 0-5 0 - 5 WBC/hpf   Bacteria, UA NONE SEEN NONE SEEN   Squamous Epithelial / LPF NONE SEEN NONE SEEN   Mucous PRESENT   Sodium     Status: None   Collection Time: 12/24/14 10:59 AM  Result Value Ref Range   Sodium 136 135 - 145 mmol/L  Hemoglobin     Status: Abnormal   Collection Time: 12/24/14 10:59 AM  Result Value Ref Range   Hemoglobin 9.9 (L) 13.0 - 18.0 g/dL    Dg Chest 1 View  12/23/2014   CLINICAL DATA:  Dizziness and syncope  EXAM: CHEST 1 VIEW  COMPARISON:  March 22, 2010  FINDINGS: There is a small area of infiltrate in the left base. Lungs elsewhere clear. Heart size and pulmonary vascularity are within normal limits. Patient is status post coronary artery bypass grafting. No adenopathy. There is an old healed fracture of the left clavicle. There are surgical clips at the gastroesophageal junction region.  IMPRESSION: Small area of infiltrate left base. Lungs otherwise clear. Followup PA and lateral chest radiographs recommended in 3-4 weeks following trial of antibiotic therapy to ensure resolution and exclude underlying malignancy.   Electronically Signed   By: Lowella Grip III M.D.   On: 12/23/2014 15:55   X-ray Chest Pa And Lateral  12/24/2014   CLINICAL DATA:  Acute chest pain and near syncope. CABG 3 weeks ago.  EXAM: CHEST  2 VIEW  COMPARISON:   12/23/2014 and 03/22/2010 chest radiographs  FINDINGS: Cardiomegaly and CABG changes again noted.  Small bilateral pleural effusions, left-greater-than-right, are noted.  There is no evidence of focal airspace disease, pulmonary edema, suspicious pulmonary nodule/mass or pneumothorax. No acute bony abnormalities are identified.  IMPRESSION: Cardiomegaly, CABG changes and  small bilateral pleural effusions, left-greater-than-right.   Electronically Signed   By: Margarette Canada M.D.   On: 12/24/2014 09:40    Review of Systems  Constitutional: Positive for malaise/fatigue.  Eyes: Positive for blurred vision.  Respiratory: Negative.   Cardiovascular: Negative.   Gastrointestinal: Negative.   Genitourinary: Positive for dysuria and urgency.  Musculoskeletal: Negative.   Skin: Negative.   Neurological: Positive for dizziness, weakness and headaches.  Endo/Heme/Allergies: Negative.   Psychiatric/Behavioral: Negative.    Blood pressure 115/77, pulse 117, temperature 98.1 F (36.7 C), temperature source Oral, resp. rate 18, height _0  (1.803 m), weight 65.454 kg (144 lb 4.8 oz), SpO2 99 %. Physical Exam  Nursing note and vitals reviewed. Constitutional: He is oriented to person, place, and time. He appears well-developed and well-nourished.  Eyes: Conjunctivae are normal. Pupils are equal, round, and reactive to light.  Neck: Normal range of motion. Neck supple.  Cardiovascular: Normal rate, S1 normal and S2 normal.  An irregularly irregular rhythm present.  Murmur heard.  Systolic murmur is present with a grade of 2/6  Respiratory: Effort normal and breath sounds normal.  GI: Soft. Bowel sounds are normal.  Musculoskeletal: Normal range of motion.  Neurological: He is alert and oriented to person, place, and time. He has normal reflexes.  Skin: Skin is warm and dry.  Psychiatric: He has a normal mood and affect.    Assessment/Plan: Syncope  vertigo  hypertension  atrial fib  coronary  disease  anemia  postop CABG at  [facility]  December 08, 2014  hypernatremia  weakness  angina  coronary disease  urinary obstruction  hyperlipidemia . PLAN  agreed with admission on telemetry  for rule out for myocardial infarction  recommend hydration gently  consider reducing tamsulosin to 0.7m qd  continue Xarelto for atrial fibrillation anticoagulation  maintain amiodarone for rhythm control  agree with aspirin 81 mg a day  follow up hemoglobin continue iron  continue foley for urinary obstruction  orthostatic blood pressures  ambulation with assistance If patient no longer orthostatic 2 more will consider early discharged and follow up as an outpatient   CALLWOOD,DWAYNE D. 12/24/2014, 1:02 PM

## 2014-12-24 NOTE — Progress Notes (Signed)
Subjective:   patient states to feel somewhat better reduced vertigo but he has not been up walking yet. No palpitations or tachycardia no chest pain. His weakness and lightheadedness of somewhat improved.  Objective:  Vital Signs in the last 24 hours: Temp:  [97.5 F (36.4 C)-98.3 F (36.8 C)] 98.1 F (36.7 C) (10/02 1236) Pulse Rate:  [69-117] 117 (10/02 1242) Resp:  [14-28] 18 (10/02 0924) BP: (91-117)/(62-84) 115/77 mmHg (10/02 1242) SpO2:  [85 %-100 %] 99 % (10/02 1236) Weight:  [65.454 kg (144 lb 4.8 oz)-73.029 kg (161 lb)] 65.454 kg (144 lb 4.8 oz) (10/01 1840)  Intake/Output from previous day: 10/01 0701 - 10/02 0700 In: 1173.3 [I.V.:1173.3] Out: 900 [Urine:900] Intake/Output from this shift: Total I/O In: 480 [P.O.:480] Out: 500 [Urine:500]  Physical Exam: General appearance: cooperative and appears stated age Neck: no adenopathy, no carotid bruit, no JVD, supple, symmetrical, trachea midline and thyroid not enlarged, symmetric, no tenderness/mass/nodules Lungs: clear to auscultation bilaterally Heart: irregularly irregular rhythm, S1, S2 normal and no S3 or S4 Abdomen: soft, non-tender; bowel sounds normal; no masses,  no organomegaly Extremities: extremities normal, atraumatic, no cyanosis or edema Pulses: 2+ and symmetric Skin: Skin color, texture, turgor normal. No rashes or lesions Neurologic: Alert and oriented X 3, normal strength and tone. Normal symmetric reflexes. Normal coordination and gait  Lab Results:  Recent Labs  12/23/14 1533 12/24/14 1059  WBC 4.3  --   HGB 10.3* 9.9*  PLT 384  --     Recent Labs  12/23/14 1533 12/24/14 1059  NA 134* 136  K 3.9  --   CL 106  --   CO2 24  --   GLUCOSE 123*  --   BUN 14  --   CREATININE 1.11  --     Recent Labs  12/23/14 1533  TROPONINI <0.03   Hepatic Function Panel No results for input(s): PROT, ALBUMIN, AST, ALT, ALKPHOS, BILITOT, BILIDIR, IBILI in the last 72 hours. No results for  input(s): CHOL in the last 72 hours. No results for input(s): PROTIME in the last 72 hours.  Imaging: Imaging results have been reviewed  Cardiac Studies:  Assessment/Plan:  Angina Arrhythmia Atrial Fibrillation CABG Coronary Artery Disease Syncope   hypotension  weakness  vertigo  anemia . PLAN  continue to  telemetry for AFib  And syncope  vertigo improved consider meclizine  hypotension continue to hold metoprolol  continue amiodarone for atrial fibrillation  agree with Xarelto for anticoagulation  continue gentle hydration  recheck orthostatics blood pressures  if the patient not orthostatic and  Ambulatory without vertigo or lightheadedness would recommend early discharge follow up with Cardiology this week  reduce  tamsulosin  to 0.4 mg a day  continue Foley bag until seen by Urology     Ndeye Tenorio D. 12/24/2014, 1:40 PM

## 2014-12-24 NOTE — Discharge Summary (Signed)
[facility] [place] NAME: Stuart Jenkins    MR#:  [id number]  DATE OF BIRTH:  14-Aug-1940  DATE OF ADMISSION:  12/23/2014 ADMITTING PHYSICIAN: Baxter Hire, MD  DATE OF DISCHARGE: No discharge date for patient encounter.  PRIMARY CARE PHYSICIAN: Otilio Miu, MD     ADMISSION DIAGNOSIS:  Syncope and collapse [R55]  DISCHARGE DIAGNOSIS:  Principal Problem:   Syncopal episodes Active Problems:   Hypotension   Hyponatremia   Anemia   Atrial fibrillation ([place])   Hyperglycemia   SECONDARY DIAGNOSIS:   Past Medical History  Diagnosis Date  . Gout   . Hyperlipidemia   . Hypertension   . Colon polyp   . BPH (benign prostatic hypertrophy)   . Peptic ulcer disease     .pro HOSPITAL COURSE:  Stuart Jenkins is a 74 year old Caucasian male with past medical history significant for history of coronary artery disease who underwent coronary artery bypass grafting at the beginning of September 2016 at [facility]. Postoperatively, according to patient's recollection, he had an episode of atrial fibrillation for which she was initiated on amiodarone and metoprolol as well as Xarelto. He presented to the hospital on 12/23/2014 with syncopal episode. He was noted to have hypotension on arrival to emergency room. Patient admits of feeling dizzy just prior to syncopal episode. EKG in the emergency room revealed atrial fibrillation, tachycardic at 110. His cardiac enzymes were negative. Patient admitted of some shortness of breath on exertion but no chest pains, which is chronic. He was noted to be hyponatremic and given IV fluids, and blood pressure medications were placed on hold. With this his blood pressure improved, he was ambulated and had no symptoms. He was seen by cardiologist, Dr. Clayborn Bigness who recommended outpatient follow-up and discontinuation,  at least temporarily,  of his blood pressure medications. Discussion  by problem 1. Syncope, unclear etiology at this time, likely element of dehydration as patient presented to hyponatremia,  given IV fluids , although could be related to intolerance of atrial fibrillation and hypotension. Normal orthostatic vital signs. Appreciate cardiology input, suspected due to dehydration. Recommended. Ambulate patient. Watch for symptoms, and discharge him home with cardiology follow-up in the next 1 week.  2. Hypotension, likely due to medications, although atrial fibrillation could be blamed as well, discontinue all blood pressure medications , at least temporarily until a patient is seen by primary care physician and/or cardiologist, unremarkable orthostatic vital signs. Ambulate and follow symptoms, likely discharge home if asymptomatic and blood pressure remained stable with ambulation 3. A. fib. Continue amiodarone as well as Xarelto.  4. Hyponatremia. Patient has been rehydrated with normal saline intravenously . Follow sodium level today 5. Anemia, followed with rehydration 6. Hyperglycemia. Check hemoglobin A1c  DISCHARGE CONDITIONS:   Stable  CONSULTS OBTAINED:  Treatment Team:  Yolonda Kida, MD  DRUG ALLERGIES:   Allergies  Allergen Reactions  . Oxycodone Hcl Other (See Comments)    Altered mental status  . Sulfa Antibiotics Rash    DISCHARGE MEDICATIONS:   Current Discharge Medication List    CONTINUE these medications which have NOT CHANGED   Details  amiodarone (PACERONE) 200 MG tablet Take 200 mg by mouth daily. Refills: 0    aspirin EC 81 MG tablet Take 81 mg by mouth daily.    atorvastatin (LIPITOR) 40 MG tablet Take 40 mg by mouth at bedtime. Refills: 0    ferrous sulfate 324 (65 FE) MG TBEC  Take 324 mg by mouth 2 (two) times daily.    folic acid (FOLVITE) 1 MG tablet Take 1 mg by mouth daily. X 14 days.    Melatonin 3 MG TABS Take 3 mg by mouth at bedtime as needed. For sleep.    Multiple Vitamin (MULTI-VITAMINS) TABS Take 1  tablet by mouth daily.    tamsulosin (FLOMAX) 0.4 MG CAPS capsule Take 0.8 mg by mouth daily.     XARELTO 20 MG TABS tablet Take 20 mg by mouth daily. Refills: 0    triamcinolone cream (KENALOG) 0.1 % Apply 1 application topically 2 (two) times daily. Qty: 30 g, Refills: 2   Associated Diagnoses: Contact dermatitis      STOP taking these medications     metoprolol succinate (TOPROL-XL) 25 MG 24 hr tablet      amLODipine (NORVASC) 5 MG tablet      losartan (COZAAR) 50 MG tablet          DISCHARGE INSTRUCTIONS:    Patient is to follow-up with primary care physician, Dr. Otilio Miu and cardiologist, Dr. Clayborn Bigness  If you experience worsening of your admission symptoms, develop shortness of breath, life threatening emergency, suicidal or homicidal thoughts you must seek medical attention immediately by calling 911 or calling your MD immediately  if symptoms less severe.  You Must read complete instructions/literature along with all the possible adverse reactions/side effects for all the Medicines you take and that have been prescribed to you. Take any new Medicines after you have completely understood and accept all the possible adverse reactions/side effects.   Please note  You were cared for by a hospitalist during your hospital stay. If you have any questions about your discharge medications or the care you received while you were in the hospital after you are discharged, you can call the unit and asked to speak with the hospitalist on call if the hospitalist that took care of you is not available. Once you are discharged, your primary care physician will handle any further medical issues. Please note that NO REFILLS for any discharge medications will be authorized once you are discharged, as it is imperative that you return to your primary care physician (or establish a relationship with a primary care physician if you do not have one) for your aftercare needs so that they can  reassess your need for medications and monitor your lab values.    Today   CHIEF COMPLAINT:   Chief Complaint  Patient presents with  . Loss of Consciousness    HISTORY OF PRESENT ILLNESS:  Stuart Jenkins  is a 74 y.o. male with a known history of coronary artery disease who underwent coronary artery bypass grafting at the beginning of September 2016 at [facility]. Postoperatively, according to patient's recollection, he had an episode of atrial fibrillation for which she was initiated on amiodarone and metoprolol as well as Xarelto. He presented to the hospital on 12/23/2014 with syncopal episode. He was noted to have hypotension on arrival to emergency room. Patient admits of feeling dizzy just prior to syncopal episode. EKG in the emergency room revealed atrial fibrillation, tachycardic at 110. His cardiac enzymes were negative. Patient admitted of some shortness of breath on exertion but no chest pains, which is chronic. He was noted to be hyponatremic and given IV fluids, and blood pressure medications were placed on hold. With this his blood pressure improved, he was ambulated and had no symptoms. He was seen by cardiologist, Dr. Clayborn Bigness  who recommended outpatient follow-up and discontinuation,  at least temporarily,  of his blood pressure medications. Discussion by problem 1. Syncope, unclear etiology at this time, likely element of dehydration as patient presented to hyponatremia,  given IV fluids , although could be related to intolerance of atrial fibrillation and hypotension. Normal orthostatic vital signs. Appreciate cardiology input, suspected due to dehydration. Recommended. Ambulate patient. Watch for symptoms, and discharge him home with cardiology follow-up in the next 1 week.  2. Hypotension, likely due to medications, although atrial fibrillation could be blamed as well, discontinue all blood pressure medications , at least temporarily until a patient is  seen by primary care physician and/or cardiologist, unremarkable orthostatic vital signs. Ambulate and follow symptoms, likely discharge home if asymptomatic and blood pressure remained stable with ambulation 3. A. fib. Continue amiodarone as well as Xarelto.  4. Hyponatremia. Patient has been rehydrated with normal saline intravenously . Follow sodium level today 5. Anemia, followed with rehydration 6. Hyperglycemia. Check hemoglobin A1c    VITAL SIGNS:  Blood pressure 99/65, pulse 108, temperature 97.9 F (36.6 C), temperature source Oral, resp. rate 18, height 5\' 11"  (1.803 m), weight 65.454 kg (144 lb 4.8 oz), SpO2 98 %.  I/O:   Intake/Output Summary (Last 24 hours) at 12/24/14 1202 Last data filed at 12/24/14 0942  Gross per 24 hour  Intake 1413.33 ml  Output   1400 ml  Net  13.33 ml    PHYSICAL EXAMINATION:  GENERAL:  74 y.o.-year-old patient lying in the bed with no acute distress.  EYES: Pupils equal, round, reactive to light and accommodation. No scleral icterus. Extraocular muscles intact.  HEENT: Head atraumatic, normocephalic. Oropharynx and nasopharynx clear.  NECK:  Supple, no jugular venous distention. No thyroid enlargement, no tenderness.  LUNGS: Normal breath sounds bilaterally, no wheezing, rales,rhonchi or crepitation. No use of accessory muscles of respiration.  CARDIOVASCULAR: S1, S2 , irregularly irregular. No murmurs, rubs, or gallops.  ABDOMEN: Soft, non-tender, non-distended. Bowel sounds present. No organomegaly or mass.  EXTREMITIES: No pedal edema, cyanosis, or clubbing.  NEUROLOGIC: Cranial nerves II through XII are intact. Muscle strength 5/5 in all extremities. Sensation intact. Gait not checked.  PSYCHIATRIC: The patient is alert and oriented x 3.  SKIN: No obvious rash, lesion, or ulcer.   DATA REVIEW:   CBC  Recent Labs Lab 12/23/14 1533 12/24/14 1059  WBC 4.3  --   HGB 10.3* 9.9*  HCT 31.7*  --   PLT 384  --     Chemistries    Recent Labs Lab 12/23/14 1533  NA 134*  K 3.9  CL 106  CO2 24  GLUCOSE 123*  BUN 14  CREATININE 1.11  CALCIUM 7.8*    Cardiac Enzymes  Recent Labs Lab 12/23/14 1533  [place] <0.03    Microbiology Results  No results found for this or any previous visit.  RADIOLOGY:  Dg Chest 1 View  12/23/2014   CLINICAL DATA:  Dizziness and syncope  EXAM: CHEST 1 VIEW  COMPARISON:  March 22, 2010  FINDINGS: There is a small area of infiltrate in the left base. Lungs elsewhere clear. Heart size and pulmonary vascularity are within normal limits. Patient is status post coronary artery bypass grafting. No adenopathy. There is an old healed fracture of the left clavicle. There are surgical clips at the gastroesophageal junction region.  IMPRESSION: Small area of infiltrate left base. Lungs otherwise clear. Followup PA and lateral chest radiographs recommended in 3-4 weeks following trial of antibiotic  therapy to ensure resolution and exclude underlying malignancy.   Electronically Signed   By: Lowella Grip III M.D.   On: 12/23/2014 15:55   X-ray Chest Pa And Lateral  12/24/2014   CLINICAL DATA:  Acute chest pain and near syncope. CABG 3 weeks ago.  EXAM: CHEST  2 VIEW  COMPARISON:  12/23/2014 and 03/22/2010 chest radiographs  FINDINGS: Cardiomegaly and CABG changes again noted.  Small bilateral pleural effusions, left-greater-than-right, are noted.  There is no evidence of focal airspace disease, pulmonary edema, suspicious pulmonary nodule/mass or pneumothorax. No acute bony abnormalities are identified.  IMPRESSION: Cardiomegaly, CABG changes and small bilateral pleural effusions, left-greater-than-right.   Electronically Signed   By: Margarette Canada M.D.   On: 12/24/2014 09:40    EKG:   Orders placed or performed during the hospital encounter of 12/23/14  . EKG 12-Lead  . EKG 12-Lead      Management plans discussed with the patient, family and they are in agreement.  CODE STATUS:      Code Status Orders        Start     Ordered   12/23/14 1841  Full code   Continuous     12/23/14 1840      TOTAL TIME TAKING CARE OF THIS PATIENT: 45 minutes.  Discussed with Dr. Forde Radon M.D on 12/24/2014 at 12:02 PM  Between 7am to 6pm - Pager - [phone]  After 6pm go to [email or web address] - password [email or web address] [facility]  [facility] [facility]  Office  [phone]  CC: Primary care physician; Otilio Miu, MD

## 2015-01-12 ENCOUNTER — Ambulatory Visit (INDEPENDENT_AMBULATORY_CARE_PROVIDER_SITE_OTHER): Payer: Medicare Other | Admitting: Family Medicine

## 2015-01-12 ENCOUNTER — Encounter: Payer: Self-pay | Admitting: Family Medicine

## 2015-01-12 VITALS — BP 118/80 | HR 72 | Ht 71.0 in | Wt 145.0 lb

## 2015-01-12 DIAGNOSIS — I25810 Atherosclerosis of coronary artery bypass graft(s) without angina pectoris: Secondary | ICD-10-CM | POA: Diagnosis not present

## 2015-01-12 DIAGNOSIS — I10 Essential (primary) hypertension: Secondary | ICD-10-CM | POA: Diagnosis not present

## 2015-01-12 NOTE — Patient Instructions (Addendum)
The DASH Diet  Dietary Approaches to Stop Hypertension   What is hypertension?  Hypertension is the term for blood pressure that is consistently higher than normal. Blood pressure is the force of blood against artery walls. Blood pressure can be unhealthy if it is above 120/80. The higher your blood pressure, the greater the health risk. High blood pressure can be controlled if you take these steps:  . Maintain a healthy weight.  . Be physically active.  . Follow a healthy eating plan, which includes foods lower in salt and sodium.  . If you drink alcoholic beverages, do so in moderation.  As noted in this list, diet affects high blood pressure. Following the DASH diet and reducing the amount of sodium in your diet will help lower your blood pressure. It will also help prevent high blood pressure.   What is the DASH diet?  Dietary Approaches to Stop Hypertension (DASH) is a diet that is low in saturated fat, cholesterol, and total fat. It emphasizes fruits, vegetables, and low-fat dairy foods. The DASH diet also includes whole-grain products, fish, poultry, and nuts. It encourages fewer servings of red meat, sweets, and sugar-containing beverages. It is rich in magnesium, potassium, and calcium, as well as protein and fiber.   How do I get started on the DASH diet?  The DASH diet requires no special foods and has no hard-to-follow recipes. Start by seeing how DASH compares with your current eating habits. The DASH eating plan shown is based on 2,000 calories a day.   Your health care provider or a dietitian can help you determine how many calories a day you need. Most adults need somewhere between 1600 and 2800 calories a day. Serving sizes will vary between 1/2 cup and 1 1/4 cups. Check the product's nutrition label to determine serving sizes of particular products.  Type of Food Servings for Diet of 2000 Calories/Day  Grains/Grain products (include at least 3 whole grain foods each day) 7-8   Fruits 4-5  Vegetables 4-5  Low fat or non-fat dairy foods 2-3  Lean meats, fish, poultry 2 or less  Nuts, seeds, and legumes 4-5/week  Fats and sweets Limited   Make changes gradually.  Here are some suggestions that might help:  . If you now eat 1 or 2 servings of vegetables a day, add a serving at lunch and another at dinner.  . If you don't eat fruit now or have only juice at breakfast, add a serving to your meals or have it as a snack.  . Drink milk or water with lunch or dinner instead of soda, sugar sweetened tea, or alcohol. Choose low-fat (1%) or fat-free (skim) dairy products to reduce how much saturated fat, total fat, cholesterol, and calories you eat.  . If you have trouble digesting dairy products, try taking lactase enzyme pills or drops (available at drugstores and groceries) with the dairy foods. Or buy lactose-free milk or milk with lactase enzyme added to it.  . Read food labels on margarines and salad dressings to choose products lowest in fat.  . If you now eat large portions of meat, cut back gradually--by a half or a third at each meal. Limit meat to 6 ounces a day (2 servings). Three to four ounces is about the size of a deck of cards.  . Have 2 or more vegetarian-style (meatless) meals each week.   Increase servings of vegetables, rice, pasta, and beans in all meals.  Try casseroles and pasta, and   stir-fry dishes, which have less meat and more vegetables, grains, and beans.  . Use fruits canned in their own juice. Fresh fruits require little or no preparation. Dried fruits are a good choice to carry with you or to have ready in the car.  . Try these snacks ideas: unsalted pretzels or nuts mixed with raisins, graham crackers, low-fat and fat-free yogurt and frozen yogurt, popcorn with no salt or butter added, and raw vegetables.  . Choose whole grain foods to get more nutrients, including minerals and fiber. For example, choose whole-wheat bread or whole-grain cereals.   . Use fresh, frozen, or no-salt-added canned vegetables.   Remember to also reduce the salt and sodium in your diet. Try to have no more than 2000 milligrams (mg) of sodium per day, with a goal of further reducing the sodium to 1500 mg per day. Three important ways to reduce sodium are:  . Use reduced-sodium or no-salt-added food products.  . Use less salt when you prepare foods and do not add salt to your food at the table.  . Read fool labels. Aim for foods that are less than 5 percent of the daily value of sodium.   The DASH eating plan was not designed for weight loss. But it contains many lower calorie foods, such as fruits and vegetables. You can make it lower in calories by replacing higher calorie foods with more fruits and vegetables. Some ideas to increase fruits and vegetables and decrease calories include:  . Eat a medium apple instead of four shortbread cookies. You'll save 80 calories.  . Eat 1/4 cup of dried apricots instead of a 2-ounce bag of pork rinds. You'll save 230 calories.  . Have a hamburger that's 3 ounces instead of 6 ounces. Add a  cup serving of carrots and a 1/2 cup serving of spinach. You'll save more than 200 calories.  . Instead of 5 ounces of chicken, have a stir fry with 2 ounces ofchicken and 1 and 1/2 cups of raw vegetables. Use a small amount of vegetable oil. You'll save 50 calories.  . Have a 1/2 cup serving of low-fat frozen yogurt instead of a 1 and 1/2 ounce milk chocolate bar. You'll save about 110 calories.  . Use low-fat or fat-free condiments, such as fat free salad dressings.  . Eat smaller portions--cut back gradually.  . Use food labels to compare fat content in packaged foods. Items marked low-fat or fat-free may be lower in fat without being lower in calories than their regular versions.  . Limit foods with lots of added sugar, such as pies, flavored yogurts, candy bars, ice cream, sherbet, regular soft drinks, and fruit drinks.  . Drink water  or club soda instead of cola or other soda drinks.  GUIDELINES FOR  LOW-CHOLESTEROL, LOW-TRIGLYCERIDE DIETS    FOODS TO USE   MEATS, FISH Choose lean meats (chicken, turkey, veal, and non-fatty cuts of beef with excess fat trimmed; one serving = 3 oz of cooked meat). Also, fresh or frozen fish, canned fish packed in water, and shellfish (lobster, crabs, shrimp, and oysters). Limit use to no more than one serving of one of these per week. Shellfish are high in cholesterol but low in saturated fat and should be used sparingly. Meats and fish should be broiled (pan or oven) or baked on a rack.  EGGS Egg substitutes and egg whites (use freely). Egg yolks (limit two per week).  FRUITS Eat three servings of fresh fruit per day (1   serving =  cup). Be sure to have at least one citrus fruit daily. Frozen and canned fruit with no sugar or syrup added may be used.  VEGETABLES Most vegetables are not limited (see next page). One dark-green (string beans, escarole) or one deep yellow (squash) vegetable is recommended daily. Cauliflower, broccoli, and celery, as well as potato skins, are recommended for their fiber content. (Fiber is associated with cholesterol reduction) It is preferable to steam vegetables, but they may be boiled, strained, or braised with polyunsaturated vegetable oil (see below).  BEANS Dried peas or beans (1 serving =  cup) may be used as a bread substitute.  NUTS Almonds, walnuts, and peanuts may be used sparingly  (1 serving = 1 Tablespoonful). Use pumpkin, sesame, or sunflower seeds.  BREADS, GRAINS One roll or one slice of whole grain or enriched bread may be used, or three soda crackers or four pieces of melba toast as a substitute. Spaghetti, rice or noodles ( cup) or  large ear of corn may be used as a bread substitute. In preparing these foods do not use butter or shortening, use soft margarine. Also use egg and sugar substitutes.  Choose high fiber grains, such as oats and whole  wheat.  CEREALS Use  cup of hot cereal or  cup of cold cereal per day. Add a sugar substitute if desired, with 99% fat free or skim milk.  MILK PRODUCTS Always use 99% fat free or skim milk, dairy products such as low fat cheeses (farmer's uncreamed diet cottage), low-fat yogurt, and powdered skim milk.  FATS, OILS Use soft (not stick) margarine; vegetable oils that are high in polyunsaturated fats (such as safflower, sunflower, soybean, corn, and cottonseed). Always refrigerate meat drippings to harden the fat and remove it before preparing gravies  DESSERTS, SNACKS Limit to two servings per day; substitute each serving for a bread/cereal serving: ice milk, water sherbet (1/4 cup); unflavored gelatin or gelatin flavored with sugar substitute (1/3 cup); pudding prepared with skim milk (1/2 cup); egg white souffls; unbuttered popcorn (1  cups). Substitute carob for chocolate.  BEVERAGES Fresh fruit juices (limit 4 oz per day); black coffee, plain or herbal teas; soft drinks with sugar substitutes; club soda, preferably salt-free; cocoa made with skim milk or nonfat dried milk and water (sugar substitute added if desired); clear broth. Alcohol: limit two servings per day (see second page).  MISCELLANEOUS  You may use the following freely: vinegar, spices, herbs, nonfat bouillon, mustard, Worcestershire sauce, soy sauce, flavoring essence.                  GUIDELINES FOR  LOW-CHOLESTEROL, LOW TRIGLYCERIDE DIETS    FOODS TO AVOID   MEATS, FISH Marbled beef, pork, bacon, sausage, and other pork products; fatty fowl (duck, goose); skin and fat of turkey and chicken; processed meats; luncheon meats (salami, bologna); frankfurters and fast-food hamburgers (theyre loaded with fat); organ meats (kidneys, liver); canned fish packed in oil.  EGGS Limit egg yolks to two per week.   FRUITS Coconuts (rich in saturated fats).  VEGETABLES Avoid avocados. Starchy vegetables (potatoes, corn, lima  beans, dried peas, beans) may be used only if substitutes for a serving of bread or cereal. (Baked potato skin, however, is desirable for its fiber content.  BEANS Commercial baked beans with sugar and/or pork added.  NUTS Avoid nuts.  Limit peanuts and walnuts to one tablespoonful per day.  BREADS, GRAINS Any baked goods with shortening and/or sugar. Commercial mixes with dried   eggs and whole milk. Avoid sweet rolls, doughnuts, breakfast pastries (Danish), and sweetened packaged cereals (the added sugar converts readily to triglycerides).  MILK PRODUCTS Whole milk and whole-milk packaged goods; cream; ice cream; whole-milk puddings, yogurt, or cheeses; nondairy cream substitutes.  FATS, OILS Butter, lard, animal fats, bacon drippings, gravies, cream sauces as well as palm and coconut oils. All these are high in saturated fats. Examine labels on cholesterol free products for hydrogenated fats. (These are oils that have been hardened into solids and in the process have become saturated.)  DESSERTS, SNACKS Fried snack foods like potato chips; chocolate; candies in general; jams, jellies, syrups; whole- milk puddings; ice cream and milk sherbets; hydrogenated peanut butter.  BEVERAGES Sugared fruit juices and soft drinks; cocoa made with whole milk and/or sugar. When using alcohol (1 oz liquor, 5 oz beer, or 2  oz dry table wine per serving), one serving must be substituted for one bread or cereal serving (limit, two servings of alcohol per day).   SPECIAL NOTES    1. Remember that even non-limited foods should be used in moderation. 2. While on a cholesterol-lowering diet, be sure to avoid animal fats and marbled meats. 3. 3. While on a triglyceride-lowering diet, be sure to avoid sweets and to control the amount of carbohydrates you eat (starchy foods such as flour, bread, potatoes).While on a tri-glyceride-lowering diet, be sure to avoid sweets 4. Buy a good low-fat cookbook, such as the one published  by the American Heart Association. 5. Consult your physician if you have any questions.               [facility] Low Glycemic Diet Plan   Low Glycemic Foods (20-49) Moderate Glycemic Foods (50-69) High Glycemic Foods (70-100)      Breakfast Creals Breakfast Cereals Breakfast Cereals  All Bran All-Bran Fruit'n Oats   Bran Buds Bran Chex   Cheerios Corn chex    Fiber One Oatmeal (not instant)   Just Right Mini-Wheats   Corn Flakes Cream of Wheat    Oat Bran Special K Swiss Muesli   Grape Nuts Grape Nut Flakes      Grits Nutri-Grain    Fruits and fruit juice: Fruits Puffed Rice Puffed Wheat    (Limit to 1-2 Servings per day) Banana (under-ride) Dates   Rice Chex Rice Krispies    Apples Apricots (fresh/dried)   Figs Grapes   Shredded Wheat Team    Blackberries Blueberries   Kiwi Mango   Total     Cherries Cranberries   Oranges Raisins     Peaches Pears    Fruits  Plums Prunes   Fruit Juices Pineapple Watermelon    Grapefruit Raspberries   Cranberry Juice Orange Juice   Banana (over-ripe)     Strawberries Tangerines      Apple Juice Grapefruit Juice   Beans and Legumes Beverages  Tomato Juice    Boston-type baked beans Sodas, sweet tea, pineapple juice   Canned pinto, kidney, or navy beans   Beans and Legumes (fresh-cooked) Green peas Vegetables  Black-eyed peas Butter Beans    Potato, baked, boiled, fried, mashed  Chick peas Lentils   Vegetables French fries  Green beans Lima beans   Beets Carrots   Canned or frozen corn  Kidney beans Navy beans   Sweet potato Yam   Parsnips  Pinto beans Snow peas   Corn on the cob Winter squash      Non-starchy vegetables Grains Breads  Asparagus,   avocado, broccoli, cabbage Cornmeal Rice, brown   Most breads (white and whole grain)  cauliflower, celery, cucumber, greens Rice, white Couscous   Bagels Bread sticks    lettuce, mushrooms, peppers, tomatoes  Bread stuffing Kaiser roll    okra,  onions, spinach, summer squash Pasta Dinner rolls   Macaroni Pizza, cheese     Grains Ravioli, meat filled Spaghetti, white   Grains  Barley Bulgur    Rice, instant Tapioca, with milk    Rye Wild rice   Nuts    Cashews Macadamia   Candy and most cookies  Nuts and oils    Almonds, peanuts, sunflower seeds Snacks Snacks  hazelnuts, pecans, walnuts Chocolate Ice cream, lowfat   Donuts Corn chips    Oils that are liquid at room temperature Muffin Popcorn   Jelly beans Pretzels      Pastries  Dairy, fish, meat, soy, and eggs    Milk, skim Lowfat cheese    Restaurant and ethnic foods  Yogurt, lowfat, fruit sugar sweetened  Most Chinese food (sugar in stir fry    or wok sauce)  Lean red meat Fish    Teriyaki-style meats and vegetables  Skinless chicken and turkey, shellfish        Egg whites (up to 3 daily), Soy Products    Egg yolks (up to 7 or _____ per week)     

## 2015-01-12 NOTE — Progress Notes (Signed)
Name: Stuart Jenkins   MRN: [id number]    DOB: 06-11-1940   Date:01/12/2015       Progress Note  Subjective  Chief Complaint  Chief Complaint  Patient presents with  . Follow-up    heart surgery- does he need to go back on Metoprolol    Heart Problem This is a new problem. The current episode started more than 1 month ago. The problem occurs daily. The problem has been gradually improving. Pertinent negatives include no abdominal pain, anorexia, arthralgias, change in bowel habit, chest pain, chills, congestion, coughing, diaphoresis, fatigue, fever, headaches, joint swelling, myalgias, nausea, neck pain, numbness, rash, sore throat, swollen glands, urinary symptoms, vertigo, visual change, vomiting or weakness. Associated symptoms comments: Soreness chest wall. The symptoms are aggravated by coughing. He has tried acetaminophen for the symptoms. The treatment provided moderate relief.    No problem-specific assessment & plan notes found for this encounter.   Past Medical History  Diagnosis Date  . Gout   . Hyperlipidemia   . Hypertension   . Colon polyp   . BPH (benign prostatic hypertrophy)   . Peptic ulcer disease     Past Surgical History  Procedure Laterality Date  . Appendectomy    . Gastric resection      partial  . Fracture surgery Left     arm, knee, collar bone  . Cardiac catheterization N/A 11/08/2014    Procedure: Left Heart Cath and Coronary Angiography;  Surgeon: Yolonda Kida, MD;  Location: [facility];  Service: Cardiovascular;  Laterality: N/A;  . Open heart surgery  12/05/2014    Family History  Problem Relation Age of Onset  . Hypertension Brother     Social History   Social History  . Marital Status: Married    Spouse Name: N/A  . Number of Children: N/A  . Years of Education: N/A   Occupational History  . Not on file.   Social History Main Topics  . Smoking status: Former Smoker    Quit date: 11/07/1984  . Smokeless  tobacco: Not on file  . Alcohol Use: Yes     Comment: less than 1 drink/week  . Drug Use: No  . Sexual Activity: Yes   Other Topics Concern  . Not on file   Social History Narrative    Allergies  Allergen Reactions  . Oxycodone Hcl Other (See Comments)    Altered mental status  . Sulfa Antibiotics Rash     Review of Systems  Constitutional: Negative for fever, chills, weight loss, malaise/fatigue, diaphoresis and fatigue.  HENT: Negative for congestion, ear discharge, ear pain and sore throat.   Eyes: Negative for blurred vision.  Respiratory: Negative for cough, sputum production, shortness of breath and wheezing.   Cardiovascular: Negative for chest pain, palpitations and leg swelling.  Gastrointestinal: Negative for heartburn, nausea, vomiting, abdominal pain, diarrhea, constipation, blood in stool, melena, anorexia and change in bowel habit.  Genitourinary: Negative for dysuria, urgency, frequency and hematuria.  Musculoskeletal: Negative for myalgias, back pain, joint pain, joint swelling, arthralgias and neck pain.  Skin: Negative for rash.  Neurological: Negative for dizziness, vertigo, tingling, sensory change, focal weakness, weakness, numbness and headaches.  Endo/Heme/Allergies: Negative for environmental allergies and polydipsia. Does not bruise/bleed easily.  Psychiatric/Behavioral: Negative for depression and suicidal ideas. The patient is not nervous/anxious and does not have insomnia.      Objective  Filed Vitals:   01/12/15 1008  BP: 118/80  Pulse: 72  Height:  5\' 11"  (1.803 m)  Weight: 145 lb (65.772 kg)    Physical Exam  Constitutional: He is oriented to person, place, and time and well-developed, well-nourished, and in no distress.  HENT:  Head: Normocephalic.  Right Ear: External ear normal.  Left Ear: External ear normal.  Nose: Nose normal.  Mouth/Throat: Oropharynx is clear and moist.  Eyes: Conjunctivae and EOM are normal. Pupils are  equal, round, and reactive to light. Right eye exhibits no discharge. Left eye exhibits no discharge. No scleral icterus.  Neck: Normal range of motion. Neck supple. No JVD present. No tracheal deviation present. No thyromegaly present.  Cardiovascular: Normal rate, regular rhythm, normal heart sounds and intact distal pulses.  Exam reveals no gallop and no friction rub.   No murmur heard. Pulmonary/Chest: Breath sounds normal. No respiratory distress. He has no wheezes. He has no rales.  Abdominal: Soft. Bowel sounds are normal. He exhibits no mass. There is no hepatosplenomegaly. There is no tenderness. There is no rebound, no guarding and no CVA tenderness.  Musculoskeletal: Normal range of motion. He exhibits no edema or tenderness.  Lymphadenopathy:    He has no cervical adenopathy.  Neurological: He is alert and oriented to person, place, and time. He has normal sensation, normal strength, normal reflexes and intact cranial nerves. No cranial nerve deficit.  Skin: Skin is warm. No rash noted.  Psychiatric: Mood and affect normal.  Nursing note and vitals reviewed.     Assessment & Plan  Problem List Items Addressed This Visit    None    Visit Diagnoses    Coronary artery disease involving coronary bypass graft of native heart without angina pectoris    -  Primary    Essential hypertension             Dr. Macon [facility]  01/12/2015

## 2015-01-18 ENCOUNTER — Encounter: Payer: Self-pay | Admitting: Family Medicine

## 2015-01-18 ENCOUNTER — Ambulatory Visit (INDEPENDENT_AMBULATORY_CARE_PROVIDER_SITE_OTHER): Payer: Medicare Other | Admitting: Family Medicine

## 2015-01-18 VITALS — BP 110/68 | HR 64 | Ht 71.0 in | Wt 145.0 lb

## 2015-01-18 DIAGNOSIS — N451 Epididymitis: Secondary | ICD-10-CM

## 2015-01-18 DIAGNOSIS — R310 Gross hematuria: Secondary | ICD-10-CM | POA: Diagnosis not present

## 2015-01-18 LAB — HEMOCCULT GUIAC POC 1CARD (OFFICE): Fecal Occult Blood, POC: NEGATIVE

## 2015-01-18 LAB — POCT URINALYSIS DIPSTICK
Glucose, UA: NEGATIVE
Spec Grav, UA: 1.02
pH, UA: 6

## 2015-01-18 MED ORDER — CIPROFLOXACIN HCL 500 MG PO TABS: 500.0000 mg | ORAL_TABLET | Freq: Two times a day (BID) | ORAL | Status: DC

## 2015-01-18 NOTE — Progress Notes (Signed)
Name: Stuart Jenkins   MRN: [id number]    DOB: [birth year]   Date:01/18/2015       Progress Note  Subjective  Chief Complaint  Chief Complaint  Patient presents with  . Hematuria    noticed swelling in L) testicle approx 1 week ago x 3 days- blood in urine    Hematuria This is a recurrent problem. The current episode started in the past 7 days. The problem has been waxing and waning since onset. He describes the hematuria as gross hematuria. The hematuria occurs throughout his entire urinary stream. He reports clotting at the beginning of his urine stream. The pain is mild. Irritative symptoms include nocturia. Irritative symptoms do not include frequency or urgency. Obstructive symptoms include dribbling, an intermittent stream, straining and a weak stream. Associated symptoms include dysuria and genital pain. Pertinent negatives include no abdominal pain, chills, facial swelling, fever, hesitancy, inability to urinate, nausea or vomiting. (Left testicular pain and swelling last week) His past medical history is significant for BPH. There is no history of kidney stones or prostatitis.    No problem-specific assessment & plan notes found for this encounter.   Past Medical History  Diagnosis Date  . Gout   . Hyperlipidemia   . Hypertension   . Colon polyp   . BPH (benign prostatic hypertrophy)   . Peptic ulcer disease     Past Surgical History  Procedure Laterality Date  . Appendectomy    . Gastric resection      partial  . Fracture surgery Left     arm, knee, collar bone  . Cardiac catheterization N/A 11/08/2014    Procedure: Left Heart Cath and Coronary Angiography;  Surgeon: Yolonda Kida, MD;  Location: [facility];  Service: Cardiovascular;  Laterality: N/A;  . Open heart surgery  12/05/2014    Family History  Problem Relation Age of Onset  . Hypertension Brother     Social History   Social History  . Marital Status: Married    Spouse Name: N/A   . Number of Children: N/A  . Years of Education: N/A   Occupational History  . Not on file.   Social History Main Topics  . Smoking status: Former Smoker    Quit date: 11/07/1984  . Smokeless tobacco: Not on file  . Alcohol Use: Yes     Comment: less than 1 drink/week  . Drug Use: No  . Sexual Activity: Yes   Other Topics Concern  . Not on file   Social History Narrative    Allergies  Allergen Reactions  . Oxycodone Hcl Other (See Comments)    Altered mental status  . Sulfa Antibiotics Rash     Review of Systems  Constitutional: Negative for fever, chills, weight loss and malaise/fatigue.  HENT: Negative for ear discharge, ear pain, facial swelling and sore throat.   Eyes: Negative for blurred vision.  Respiratory: Negative for cough, sputum production, shortness of breath and wheezing.   Cardiovascular: Negative for chest pain, palpitations and leg swelling.  Gastrointestinal: Negative for heartburn, nausea, vomiting, abdominal pain, diarrhea, constipation, blood in stool and melena.  Genitourinary: Positive for dysuria, hematuria and nocturia. Negative for hesitancy, urgency and frequency.  Musculoskeletal: Negative for myalgias, back pain, joint pain and neck pain.  Skin: Negative for rash.  Neurological: Negative for dizziness, tingling, sensory change, focal weakness and headaches.  Endo/Heme/Allergies: Negative for environmental allergies and polydipsia. Does not bruise/bleed easily.  Psychiatric/Behavioral: Negative for depression and suicidal  ideas. The patient is not nervous/anxious and does not have insomnia.      Objective  Filed Vitals:   01/18/15 1036  BP: 110/68  Pulse: 64  Height: 5\' 11"  (1.803 m)  Weight: 145 lb (65.772 kg)    Physical Exam  Constitutional: He is oriented to person, place, and time and well-developed, well-nourished, and in no distress.  HENT:  Head: Normocephalic.  Right Ear: External ear normal.  Left Ear: External ear  normal.  Nose: Nose normal.  Mouth/Throat: Oropharynx is clear and moist.  Eyes: Conjunctivae and EOM are normal. Pupils are equal, round, and reactive to light. Right eye exhibits no discharge. Left eye exhibits no discharge. No scleral icterus.  Neck: Normal range of motion. Neck supple. No JVD present. No tracheal deviation present. No thyromegaly present.  Cardiovascular: Normal rate, regular rhythm, normal heart sounds and intact distal pulses.  Exam reveals no gallop and no friction rub.   No murmur heard. Pulmonary/Chest: Breath sounds normal. No respiratory distress. He has no wheezes. He has no rales.  Abdominal: Soft. Bowel sounds are normal. He exhibits no mass. There is no hepatosplenomegaly. There is no tenderness. There is no rebound, no guarding and no CVA tenderness.  Genitourinary: Rectum normal and prostate normal. He exhibits testicular tenderness.  Musculoskeletal: Normal range of motion. He exhibits no edema or tenderness.  Lymphadenopathy:    He has no cervical adenopathy.  Neurological: He is alert and oriented to person, place, and time. He has normal sensation, normal strength, normal reflexes and intact cranial nerves. No cranial nerve deficit.  Skin: Skin is warm. No rash noted.  Psychiatric: Mood and affect normal.      Assessment & Plan  Problem List Items Addressed This Visit    None    Visit Diagnoses    Hematuria, gross    -  Primary    Relevant Orders    POCT Occult Blood Stool (Completed)    POCT Urinalysis Dipstick (Completed)    Epididymitis        Relevant Medications    ciprofloxacin (CIPRO) 500 MG tablet         Dr. [facility]  01/18/2015

## 2015-01-30 ENCOUNTER — Ambulatory Visit: Payer: Medicare Other | Admitting: Anesthesiology

## 2015-01-30 ENCOUNTER — Encounter: Payer: Self-pay | Admitting: *Deleted

## 2015-01-30 ENCOUNTER — Ambulatory Visit
Admission: RE | Admit: 2015-01-30 | Discharge: 2015-01-30 | Disposition: A | Discharge status: Home / Self Care | Payer: Medicare Other | Source: Ambulatory Visit | Attending: Internal Medicine | Admitting: Internal Medicine

## 2015-01-30 ENCOUNTER — Encounter
Admission: RE | Disposition: A | Discharge status: Home / Self Care | Payer: Self-pay | Source: Ambulatory Visit | Attending: Internal Medicine

## 2015-01-30 DIAGNOSIS — Z8711 Personal history of peptic ulcer disease: Secondary | ICD-10-CM | POA: Diagnosis not present

## 2015-01-30 DIAGNOSIS — M159 Polyosteoarthritis, unspecified: Secondary | ICD-10-CM | POA: Diagnosis not present

## 2015-01-30 DIAGNOSIS — Z885 Allergy status to narcotic agent status: Secondary | ICD-10-CM | POA: Insufficient documentation

## 2015-01-30 DIAGNOSIS — Z7901 Long term (current) use of anticoagulants: Secondary | ICD-10-CM | POA: Diagnosis not present

## 2015-01-30 DIAGNOSIS — I10 Essential (primary) hypertension: Secondary | ICD-10-CM | POA: Diagnosis not present

## 2015-01-30 DIAGNOSIS — I4892 Unspecified atrial flutter: Secondary | ICD-10-CM | POA: Diagnosis not present

## 2015-01-30 DIAGNOSIS — Z903 Acquired absence of stomach [part of]: Secondary | ICD-10-CM | POA: Insufficient documentation

## 2015-01-30 DIAGNOSIS — E785 Hyperlipidemia, unspecified: Secondary | ICD-10-CM | POA: Diagnosis not present

## 2015-01-30 DIAGNOSIS — N400 Enlarged prostate without lower urinary tract symptoms: Secondary | ICD-10-CM | POA: Insufficient documentation

## 2015-01-30 DIAGNOSIS — I2510 Atherosclerotic heart disease of native coronary artery without angina pectoris: Secondary | ICD-10-CM | POA: Insufficient documentation

## 2015-01-30 DIAGNOSIS — Z8249 Family history of ischemic heart disease and other diseases of the circulatory system: Secondary | ICD-10-CM | POA: Insufficient documentation

## 2015-01-30 DIAGNOSIS — Z86010 Personal history of colonic polyps: Secondary | ICD-10-CM | POA: Diagnosis not present

## 2015-01-30 DIAGNOSIS — Z833 Family history of diabetes mellitus: Secondary | ICD-10-CM | POA: Insufficient documentation

## 2015-01-30 DIAGNOSIS — Z882 Allergy status to sulfonamides status: Secondary | ICD-10-CM | POA: Diagnosis not present

## 2015-01-30 DIAGNOSIS — I480 Paroxysmal atrial fibrillation: Secondary | ICD-10-CM | POA: Insufficient documentation

## 2015-01-30 DIAGNOSIS — Z87891 Personal history of nicotine dependence: Secondary | ICD-10-CM | POA: Diagnosis not present

## 2015-01-30 DIAGNOSIS — Z951 Presence of aortocoronary bypass graft: Secondary | ICD-10-CM | POA: Diagnosis not present

## 2015-01-30 DIAGNOSIS — Z9889 Other specified postprocedural states: Secondary | ICD-10-CM | POA: Diagnosis not present

## 2015-01-30 DIAGNOSIS — I4891 Unspecified atrial fibrillation: Secondary | ICD-10-CM | POA: Diagnosis present

## 2015-01-30 DIAGNOSIS — Z79899 Other long term (current) drug therapy: Secondary | ICD-10-CM | POA: Diagnosis not present

## 2015-01-30 HISTORY — DX: Unspecified atrial fibrillation: I48.91

## 2015-01-30 HISTORY — PX: ELECTROPHYSIOLOGIC STUDY: SHX172A

## 2015-01-30 SURGERY — CARDIOVERSION (CATH LAB)
Anesthesia: General

## 2015-01-30 MED ORDER — SODIUM CHLORIDE 0.9 % IV SOLN
INTRAVENOUS | Status: DC
  Administered 2015-01-30 (×2): via INTRAVENOUS

## 2015-01-30 MED ORDER — PROPOFOL 10 MG/ML IV BOLUS
INTRAVENOUS | Status: DC | PRN
  Administered 2015-01-30: 40 mg via INTRAVENOUS
  Administered 2015-01-30: 10 mg via INTRAVENOUS

## 2015-01-30 NOTE — Discharge Instructions (Signed)
Electrical Cardioversion, Care After °Refer to this sheet in the next few weeks. These instructions provide you with information on caring for yourself after your procedure. Your health care provider may also give you more specific instructions. Your treatment has been planned according to current medical practices, but problems sometimes occur. Call your health care provider if you have any problems or questions after your procedure. °WHAT TO EXPECT AFTER THE PROCEDURE °After your procedure, it is typical to have the following sensations: °· Some redness on the skin where the shocks were delivered. If this is tender, a sunburn lotion or hydrocortisone cream may help. °· Possible return of an abnormal heart rhythm within hours or days after the procedure. °HOME CARE INSTRUCTIONS °· Take medicines only as directed by your health care provider. Be sure you understand how and when to take your medicine. °· Learn how to feel your pulse and check it often. °· Limit your activity for 48 hours after the procedure or as directed by your health care provider. °· Avoid or minimize caffeine and other stimulants as directed by your health care provider. °SEEK MEDICAL CARE IF: °· You feel like your heart is beating too fast or your pulse is not regular. °· You have any questions about your medicines. °· You have bleeding that will not stop. °SEEK IMMEDIATE MEDICAL CARE IF: °· You are dizzy or feel faint. °· It is hard to breathe or you feel short of breath. °· There is a change in discomfort in your chest. °· Your speech is slurred or you have trouble moving an arm or leg on one side of your body. °· You get a serious muscle cramp that does not go away. °· Your fingers or toes turn cold or blue. °  °This information is not intended to replace advice given to you by your health care provider. Make sure you discuss any questions you have with your health care provider. °  °Document Released: 12/29/2012 Document Revised: 03/31/2014  Document Reviewed: 12/29/2012 °Elsevier Interactive Patient Education ©2016 [employer] ° °

## 2015-01-30 NOTE — Progress Notes (Signed)
Pt doing well post cardioversion, sr per monitor, post ekg done, Dr Clayborn Bigness out to speak with wife and brother, questions answered, return appointment already prescheduled prior to cardioversion, instructions given with questions answered. All paperwork given to wife,

## 2015-01-30 NOTE — Anesthesia Postprocedure Evaluation (Signed)
  Anesthesia Post-op Note  Patient: Stuart Jenkins  Procedure(s) Performed: Procedure(s): Cardioversion (N/A)  Anesthesia type:General  Patient location: PACU  Post pain: Pain level controlled  Post assessment: Post-op Vital signs reviewed, Patient's Cardiovascular Status Stable, Respiratory Function Stable, Patent Airway and No signs of Nausea or vomiting  Post vital signs: Reviewed and stable  Last Vitals:  Filed Vitals:   01/30/15 0915  BP: 120/85  Pulse: 81  Temp:   Resp: 16    Level of consciousness: awake, alert  and patient cooperative  Complications: No apparent anesthesia complications

## 2015-01-30 NOTE — Transfer of Care (Signed)
Immediate Anesthesia Transfer of Care Note  Patient: Stuart Jenkins  Procedure(s) Performed: Procedure(s): Cardioversion (N/A)  Patient Location: PACU  Anesthesia Type:General  Level of Consciousness: awake  Airway & Oxygen Therapy: Patient Spontanous Breathing and Patient connected to nasal cannula oxygen  Post-op Assessment: Report given to RN and Post -op Vital signs reviewed and stable  Post vital signs: stable  Last Vitals:  Filed Vitals:   01/30/15 0843  BP: 113/86  Pulse: 65  Temp: 36.1 C  Resp: 22    Complications: No apparent anesthesia complications

## 2015-01-30 NOTE — Anesthesia Preprocedure Evaluation (Signed)
Anesthesia Evaluation  Patient identified by MRN, date of birth, ID band Patient awake    Reviewed: Allergy & Precautions, NPO status , Patient's Chart, lab work & pertinent test results, reviewed documented beta blocker date and time   Airway Mallampati: II  TM Distance: >3 FB     Dental  (+) Chipped   Pulmonary former smoker,           Cardiovascular hypertension, Pt. on medications      Neuro/Psych    GI/Hepatic PUD,   Endo/Other    Renal/GU      Musculoskeletal   Abdominal   Peds  Hematology  (+) anemia ,   Anesthesia Other Findings   Reproductive/Obstetrics                             Anesthesia Physical Anesthesia Plan  ASA: III  Anesthesia Plan: General   Post-op Pain Management:    Induction: Intravenous  Airway Management Planned:   Additional Equipment:   Intra-op Plan:   Post-operative Plan:   Informed Consent: I have reviewed the patients History and Physical, chart, labs and discussed the procedure including the risks, benefits and alternatives for the proposed anesthesia with the patient or authorized representative who has indicated his/her understanding and acceptance.     Plan Discussed with: CRNA  Anesthesia Plan Comments:         Anesthesia Quick Evaluation

## 2015-02-28 ENCOUNTER — Ambulatory Visit (INDEPENDENT_AMBULATORY_CARE_PROVIDER_SITE_OTHER): Payer: Medicare Other | Admitting: Family Medicine

## 2015-02-28 ENCOUNTER — Ambulatory Visit
Admission: RE | Admit: 2015-02-28 | Discharge: 2015-02-28 | Disposition: A | Discharge status: Home / Self Care | Payer: Medicare Other | Source: Ambulatory Visit | Attending: Family Medicine | Admitting: Family Medicine

## 2015-02-28 ENCOUNTER — Encounter: Payer: Self-pay | Admitting: Family Medicine

## 2015-02-28 VITALS — BP 110/76 | HR 70 | Ht 71.0 in | Wt 147.0 lb

## 2015-02-28 DIAGNOSIS — M8580 Other specified disorders of bone density and structure, unspecified site: Secondary | ICD-10-CM | POA: Insufficient documentation

## 2015-02-28 DIAGNOSIS — M5090 Cervical disc disorder, unspecified, unspecified cervical region: Secondary | ICD-10-CM | POA: Insufficient documentation

## 2015-02-28 MED ORDER — CYCLOBENZAPRINE HCL 10 MG PO TABS: 10.0000 mg | ORAL_TABLET | Freq: Three times a day (TID) | ORAL | Status: DC | PRN

## 2015-02-28 MED ORDER — PREDNISONE 10 MG PO TABS: 10.0000 mg | ORAL_TABLET | Freq: Every day | ORAL | Status: DC

## 2015-02-28 MED ORDER — ETODOLAC 500 MG PO TABS: 500.0000 mg | ORAL_TABLET | Freq: Two times a day (BID) | ORAL | Status: DC

## 2015-02-28 NOTE — Progress Notes (Signed)
Name: Stuart Jenkins   MRN: [id number]    DOB: 1940/11/13   Date:02/28/2015       Progress Note  Subjective  Chief Complaint  Chief Complaint  Patient presents with  . Neck Pain    neck has had pain and been popping when turns his head x 6 weeks- has not tried OTC because he is "scared of what to take "    Neck Pain  This is a new problem. The current episode started more than 1 month ago. The problem occurs constantly. The problem has been gradually worsening. The pain is associated with nothing. The pain is present in the right side and left side. The quality of the pain is described as aching. The pain is at a severity of 8/10. The pain is severe. The symptoms are aggravated by coughing, bending and twisting. The pain is same all the time. Stiffness is present all day. Pertinent negatives include no chest pain, fever, headaches, leg pain, numbness, pain with swallowing, paresis, tingling, trouble swallowing, visual change, weakness or weight loss. He has tried acetaminophen for the symptoms. The treatment provided no relief.    No problem-specific assessment & plan notes found for this encounter.   Past Medical History  Diagnosis Date  . Gout   . Hyperlipidemia   . Hypertension   . Colon polyp   . BPH (benign prostatic hypertrophy)   . Peptic ulcer disease   . Atrial fibrillation [facility])     Past Surgical History  Procedure Laterality Date  . Appendectomy    . Gastric resection      partial  . Fracture surgery Left     arm, knee, collar bone  . Cardiac catheterization N/A 11/08/2014    Procedure: Left Heart Cath and Coronary Angiography;  Surgeon: Yolonda Kida, MD;  Location: [facility];  Service: Cardiovascular;  Laterality: N/A;  . Open heart surgery  12/05/2014  . Electrophysiologic study N/A 01/30/2015    Procedure: Cardioversion;  Surgeon: Yolonda Kida, MD;  Location: [facility];  Service: Cardiovascular;  Laterality: N/A;    Family History   Problem Relation Age of Onset  . Hypertension Brother     Social History   Social History  . Marital Status: Married    Spouse Name: N/A  . Number of Children: N/A  . Years of Education: N/A   Occupational History  . Not on file.   Social History Main Topics  . Smoking status: Former Smoker    Quit date: 11/07/1984  . Smokeless tobacco: Not on file  . Alcohol Use: Yes     Comment: less than 1 drink/week  . Drug Use: No  . Sexual Activity: Yes   Other Topics Concern  . Not on file   Social History Narrative    Allergies  Allergen Reactions  . Oxycodone Hcl Other (See Comments)    Altered mental status  . Sulfa Antibiotics Rash     Review of Systems  Constitutional: Negative for fever, chills, weight loss and malaise/fatigue.  HENT: Negative for ear discharge, ear pain, sore throat and trouble swallowing.   Eyes: Negative for blurred vision.  Respiratory: Negative for cough, sputum production, shortness of breath and wheezing.   Cardiovascular: Negative for chest pain, palpitations and leg swelling.  Gastrointestinal: Negative for heartburn, nausea, abdominal pain, diarrhea, constipation, blood in stool and melena.  Genitourinary: Negative for dysuria, urgency, frequency and hematuria.  Musculoskeletal: Positive for neck pain. Negative for myalgias, back pain  and joint pain.  Skin: Negative for rash.  Neurological: Negative for dizziness, tingling, sensory change, focal weakness, weakness, numbness and headaches.  Endo/Heme/Allergies: Negative for environmental allergies and polydipsia. Does not bruise/bleed easily.  Psychiatric/Behavioral: Negative for depression and suicidal ideas. The patient is not nervous/anxious and does not have insomnia.      Objective  Filed Vitals:   02/28/15 0850  BP: 110/76  Pulse: 70  Height: 5\' 11"  (1.803 m)  Weight: 147 lb (66.679 kg)    Physical Exam  Constitutional: He is oriented to person, place, and time and  well-developed, well-nourished, and in no distress.  HENT:  Head: Normocephalic.  Right Ear: External ear normal.  Left Ear: External ear normal.  Nose: Nose normal.  Mouth/Throat: Oropharynx is clear and moist.  Eyes: Conjunctivae and EOM are normal. Pupils are equal, round, and reactive to light. Right eye exhibits no discharge. Left eye exhibits no discharge. No scleral icterus.  Neck: Normal range of motion. Neck supple. No JVD present. No tracheal deviation present. No thyromegaly present.  Cardiovascular: Normal rate, regular rhythm, normal heart sounds and intact distal pulses.  Exam reveals no gallop and no friction rub.   No murmur heard. Pulmonary/Chest: Breath sounds normal. No respiratory distress. He has no wheezes. He has no rales.  Abdominal: Soft. Bowel sounds are normal. He exhibits no mass. There is no hepatosplenomegaly. There is no tenderness. There is no rebound, no guarding and no CVA tenderness.  Musculoskeletal: He exhibits no edema or tenderness.       Cervical back: He exhibits decreased range of motion and spasm.  Lymphadenopathy:    He has no cervical adenopathy.  Neurological: He is alert and oriented to person, place, and time. He has normal sensation, normal strength, normal reflexes and intact cranial nerves. No cranial nerve deficit.  Skin: Skin is warm. No rash noted.  Psychiatric: Mood and affect normal.      Assessment & Plan  Problem List Items Addressed This Visit    None    Visit Diagnoses    Cervical disc disorder    -  Primary    Relevant Medications    etodolac (LODINE) 500 MG tablet    cyclobenzaprine (FLEXERIL) 10 MG tablet    predniSONE (DELTASONE) 10 MG tablet    Other Relevant Orders    DG Cervical Spine Complete (Completed)         Dr. Jermari Tamargo [facility]  02/28/2015

## 2015-04-04 NOTE — Procedures (Signed)
Electrical Cardioversion Procedure Note Stuart Jenkins [id number] April 24, 1940  Procedure: Electrical Cardioversion Indications:  Atrial Fibrillation  Procedure Details Consent: Risks of procedure as well as the alternatives and risks of each were explained to the (patient/caregiver).  Consent for procedure obtained. Time Out: Verified patient identification, verified procedure, site/side was marked, verified correct patient position, special equipment/implants available, medications/allergies/relevent history reviewed, required imaging and test results available.  Performed  Patient placed on cardiac monitor, pulse oximetry, supplemental oxygen as necessary.  Sedation given: Etomidate  Performed by anesthesia Pacer pads placed anterior and posterior chest.  Cardioverted 1 time(s).  Cardioverted at 120J.  Evaluation Findings: Post procedure EKG shows: NSR Complications: None Patient did tolerate procedure well.   Stuart Jenkins D. 04/04/2015, 12:08 PM

## 2015-04-10 ENCOUNTER — Ambulatory Visit (INDEPENDENT_AMBULATORY_CARE_PROVIDER_SITE_OTHER): Payer: Medicare HMO | Admitting: Family Medicine

## 2015-04-10 ENCOUNTER — Encounter: Payer: Self-pay | Admitting: Family Medicine

## 2015-04-10 VITALS — BP 130/80 | HR 60 | Ht 71.0 in | Wt 145.0 lb

## 2015-04-10 DIAGNOSIS — M8580 Other specified disorders of bone density and structure, unspecified site: Secondary | ICD-10-CM | POA: Diagnosis not present

## 2015-04-10 DIAGNOSIS — M5090 Cervical disc disorder, unspecified, unspecified cervical region: Secondary | ICD-10-CM | POA: Diagnosis not present

## 2015-04-10 NOTE — Progress Notes (Signed)
Name: Stuart Jenkins   MRN: [id number]    DOB: November 05, 1940   Date:04/10/2015       Progress Note  Subjective  Chief Complaint  Chief Complaint  Patient presents with  . Neck Pain    had a c-spine that showed deg changes at multiple levels as well as osteopenia- neck isn't getting better with Flexeril and Etodolac    Neck Pain  This is a new problem. The current episode started more than 1 month ago. The problem occurs daily. The problem has been waxing and waning. The pain is associated with nothing. The pain is present in the midline and occipital region. The quality of the pain is described as aching. The pain is at a severity of 8/10. The pain is moderate. The symptoms are aggravated by twisting, position and bending. The pain is same all the time. Pertinent negatives include no chest pain, fever, headaches, paresis, tingling, visual change, weakness or weight loss. He has tried NSAIDs and muscle relaxants for the symptoms. The treatment provided no relief.    No problem-specific assessment & plan notes found for this encounter.   Past Medical History  Diagnosis Date  . Gout   . Hyperlipidemia   . Hypertension   . Colon polyp   . BPH (benign prostatic hypertrophy)   . Peptic ulcer disease   . Atrial fibrillation [facility])     Past Surgical History  Procedure Laterality Date  . Appendectomy    . Gastric resection      partial  . Fracture surgery Left     arm, knee, collar bone  . Cardiac catheterization N/A 11/08/2014    Procedure: Left Heart Cath and Coronary Angiography;  Surgeon: Yolonda Kida, MD;  Location: [facility];  Service: Cardiovascular;  Laterality: N/A;  . Open heart surgery  12/05/2014  . Electrophysiologic study N/A 01/30/2015    Procedure: Cardioversion;  Surgeon: Yolonda Kida, MD;  Location: [facility];  Service: Cardiovascular;  Laterality: N/A;    Family History  Problem Relation Age of Onset  . Hypertension Brother     Social  History   Social History  . Marital Status: Married    Spouse Name: N/A  . Number of Children: N/A  . Years of Education: N/A   Occupational History  . Not on file.   Social History Main Topics  . Smoking status: Former Smoker    Quit date: 11/07/1984  . Smokeless tobacco: Not on file  . Alcohol Use: Yes     Comment: less than 1 drink/week  . Drug Use: No  . Sexual Activity: Yes   Other Topics Concern  . Not on file   Social History Narrative    Allergies  Allergen Reactions  . Oxycodone Hcl Other (See Comments)    Altered mental status  . Sulfa Antibiotics Rash     Review of Systems  Constitutional: Negative for fever, chills, weight loss and malaise/fatigue.  HENT: Negative for ear discharge, ear pain and sore throat.   Eyes: Negative for blurred vision.  Respiratory: Negative for cough, sputum production, shortness of breath and wheezing.   Cardiovascular: Negative for chest pain, palpitations and leg swelling.  Gastrointestinal: Negative for heartburn, nausea, abdominal pain, diarrhea, constipation, blood in stool and melena.  Genitourinary: Negative for dysuria, urgency, frequency and hematuria.  Musculoskeletal: Positive for neck pain. Negative for myalgias, back pain, joint pain and falls.  Skin: Negative for rash.  Neurological: Negative for dizziness, tingling, sensory change,  focal weakness, weakness and headaches.  Endo/Heme/Allergies: Negative for environmental allergies and polydipsia. Does not bruise/bleed easily.  Psychiatric/Behavioral: Negative for depression and suicidal ideas. The patient is not nervous/anxious and does not have insomnia.      Objective  Filed Vitals:   04/10/15 1021  BP: 130/80  Pulse: 60  Height: 5\' 11"  (1.803 m)  Weight: 145 lb (65.772 kg)    Physical Exam  Constitutional: He is oriented to person, place, and time and well-developed, well-nourished, and in no distress.  HENT:  Head: Normocephalic.  Right Ear:  External ear normal.  Left Ear: External ear normal.  Nose: Nose normal.  Mouth/Throat: Oropharynx is clear and moist.  Eyes: Conjunctivae and EOM are normal. Pupils are equal, round, and reactive to light. Right eye exhibits no discharge. Left eye exhibits no discharge. No scleral icterus.  Neck: Normal range of motion. Neck supple. No JVD present. No tracheal deviation present. No thyromegaly present.  Cardiovascular: Normal rate, regular rhythm, normal heart sounds and intact distal pulses.  Exam reveals no gallop and no friction rub.   No murmur heard. Pulmonary/Chest: Breath sounds normal. No respiratory distress. He has no wheezes. He has no rales.  Abdominal: Soft. Bowel sounds are normal. He exhibits no mass. There is no hepatosplenomegaly. There is no tenderness. There is no rebound, no guarding and no CVA tenderness.  Musculoskeletal: Normal range of motion. He exhibits no edema or tenderness.  Lymphadenopathy:    He has no cervical adenopathy.  Neurological: He is alert and oriented to person, place, and time. He has normal sensation, normal strength, normal reflexes and intact cranial nerves. No cranial nerve deficit.  Skin: Skin is warm. No rash noted.  Psychiatric: Mood and affect normal.  Nursing note and vitals reviewed.     Assessment & Plan  Problem List Items Addressed This Visit      Musculoskeletal and Integument   Cervical disc disorder - Primary   Relevant Orders   MR Cervical Spine Wo Contrast    Other Visit Diagnoses    Osteopenia determined by x-ray        calcium Jearl Klinefelter d discussed    Cervical disc disease        Relevant Orders    MR Cervical Spine Wo Contrast         Dr. Deanna Jones [facility]  04/10/2015

## 2015-04-10 NOTE — Patient Instructions (Signed)
Calcium; Vitamin D oral tablets What is this medicine? CALCIUM; VITAMIN D (KAL see um; VYE ta min D) is a vitamin supplement. It is used to prevent conditions of low calcium and vitamin D. This medicine may be used for other purposes; ask your health care provider or pharmacist if you have questions. What should I tell my health care provider before I take this medicine? They need to know if you have any of these conditions: -constipation -dehydration -heart disease -high level of calcium or vitamin D in the blood -high level of phosphate in the blood -kidney disease -kidney stones -liver disease -parathyroid disease -sarcoidosis -stomach ulcer or obstruction -an unusual or allergic reaction to calcium, vitamin D, tartrazine dye, other medicines, foods, dyes, or preservatives -pregnant or trying to get pregnant -breast-feeding How should I use this medicine? Take this medicine by mouth with a glass of water. Follow the directions on the label. Take with food or within 1 hour after a meal. Take your medicine at regular intervals. Do not take your medicine more often than directed. Talk to your pediatrician regarding the use of this medicine in children. While this medicine may be used in children for selected conditions, precautions do apply. Overdosage: If you think you have taken too much of this medicine contact a poison control center or emergency room at once. NOTE: This medicine is only for you. Do not share this medicine with others. What if I miss a dose? If you miss a dose, take it as soon as you can. If it is almost time for your next dose, take only that dose. Do not take double or extra doses. What may interact with this medicine? Do not take this medicine with any of the following medications: -ammonium chloride -methenamine This medicine may also interact with the following medications: -antibiotics like ciprofloxacin, gatifloxacin,  tetracycline -captopril -delavirdine -diuretics -gabapentin -iron supplements -medicines for fungal infections like ketoconazole and itraconazole -medicines for seizures like ethotoin and phenytoin -mineral oil -mycophenolate -other vitamins with calcium, vitamin D, or minerals -quinidine -rosuvastatin -sucralfate -thyroid medicine This list may not describe all possible interactions. Give your health care provider a list of all the medicines, herbs, non-prescription drugs, or dietary supplements you use. Also tell them if you smoke, drink alcohol, or use illegal drugs. Some items may interact with your medicine. What should I watch for while using this medicine? Taking this medicine is not a substitute for a well-balanced diet and exercise. Talk with your doctor or health care provider and follow a healthy lifestyle. Do not take this medicine with high-fiber foods, large amounts of alcohol, or drinks containing caffeine. Do not take this medicine within 2 hours of any other medicines. What side effects may I notice from receiving this medicine? Side effects that you should report to your doctor or health care professional as soon as possible: -allergic reactions like skin rash, itching or hives, swelling of the face, lips, or tongue -confusion -dry mouth -high blood pressure -increased hunger or thirst -increased urination -irregular heartbeat -metallic taste -muscle or bone pain -pain when urinating -seizure -unusually weak or tired -weight loss Side effects that usually do not require medical attention (report to your doctor or health care professional if they continue or are bothersome): -constipation -diarrhea -headache -loss of appetite -nausea, vomiting -stomach upset This list may not describe all possible side effects. Call your doctor for medical advice about side effects. You may report side effects to FDA at 1-800-FDA-1088. Where should I keep   my medicine? Keep  out of the reach of children. Store at room temperature between 15 and 30 degrees C (59 and 86 degrees F). Protect from light. Keep container tightly closed. Throw away any unused medicine after the expiration date. NOTE: This sheet is a summary. It may not cover all possible information. If you have questions about this medicine, talk to your doctor, pharmacist, or health care provider.    2016, Elsevier/[employer] Standard. (2007-06-23 17:56:23)  

## 2015-04-23 ENCOUNTER — Other Ambulatory Visit: Payer: Self-pay

## 2015-04-25 ENCOUNTER — Ambulatory Visit
Admission: RE | Admit: 2015-04-25 | Discharge: 2015-04-25 | Disposition: A | Discharge status: Home / Self Care | Payer: Medicare HMO | Source: Ambulatory Visit | Attending: Family Medicine | Admitting: Family Medicine

## 2015-04-25 DIAGNOSIS — M5090 Cervical disc disorder, unspecified, unspecified cervical region: Secondary | ICD-10-CM | POA: Diagnosis not present

## 2015-04-25 DIAGNOSIS — M50322 Other cervical disc degeneration at C5-C6 level: Secondary | ICD-10-CM | POA: Diagnosis not present

## 2015-04-25 DIAGNOSIS — M542 Cervicalgia: Secondary | ICD-10-CM | POA: Insufficient documentation

## 2015-04-25 DIAGNOSIS — M5032 Other cervical disc degeneration, mid-cervical region, unspecified level: Secondary | ICD-10-CM | POA: Diagnosis not present

## 2015-04-25 DIAGNOSIS — M47812 Spondylosis without myelopathy or radiculopathy, cervical region: Secondary | ICD-10-CM | POA: Insufficient documentation

## 2015-04-25 DIAGNOSIS — M50321 Other cervical disc degeneration at C4-C5 level: Secondary | ICD-10-CM | POA: Insufficient documentation

## 2015-04-26 ENCOUNTER — Other Ambulatory Visit: Payer: Self-pay

## 2015-04-26 ENCOUNTER — Telehealth: Payer: Self-pay

## 2015-04-26 DIAGNOSIS — M5090 Cervical disc disorder, unspecified, unspecified cervical region: Secondary | ICD-10-CM

## 2015-04-26 NOTE — Telephone Encounter (Signed)
Sent referral to neurosurgery

## 2015-05-04 DIAGNOSIS — M5090 Cervical disc disorder, unspecified, unspecified cervical region: Secondary | ICD-10-CM | POA: Diagnosis not present

## 2015-05-04 DIAGNOSIS — M791 Myalgia: Secondary | ICD-10-CM | POA: Diagnosis not present

## 2015-05-04 DIAGNOSIS — R51 Headache: Secondary | ICD-10-CM | POA: Diagnosis not present

## 2015-05-04 DIAGNOSIS — M1288 Other specific arthropathies, not elsewhere classified, other specified site: Secondary | ICD-10-CM | POA: Diagnosis not present

## 2015-05-09 DIAGNOSIS — M542 Cervicalgia: Secondary | ICD-10-CM | POA: Diagnosis not present

## 2015-05-14 DIAGNOSIS — H40039 Anatomical narrow angle, unspecified eye: Secondary | ICD-10-CM | POA: Diagnosis not present

## 2015-05-15 DIAGNOSIS — M542 Cervicalgia: Secondary | ICD-10-CM | POA: Diagnosis not present

## 2015-05-18 DIAGNOSIS — M542 Cervicalgia: Secondary | ICD-10-CM | POA: Diagnosis not present

## 2015-05-23 DIAGNOSIS — M542 Cervicalgia: Secondary | ICD-10-CM | POA: Diagnosis not present

## 2015-05-28 DIAGNOSIS — M542 Cervicalgia: Secondary | ICD-10-CM | POA: Diagnosis not present

## 2015-05-31 DIAGNOSIS — H40039 Anatomical narrow angle, unspecified eye: Secondary | ICD-10-CM | POA: Diagnosis not present

## 2015-06-01 ENCOUNTER — Ambulatory Visit (INDEPENDENT_AMBULATORY_CARE_PROVIDER_SITE_OTHER): Payer: Medicare HMO | Admitting: Family Medicine

## 2015-06-01 ENCOUNTER — Encounter: Payer: Self-pay | Admitting: Family Medicine

## 2015-06-01 VITALS — BP 160/108 | HR 78 | Ht 71.0 in | Wt 155.0 lb

## 2015-06-01 DIAGNOSIS — N3000 Acute cystitis without hematuria: Secondary | ICD-10-CM

## 2015-06-01 DIAGNOSIS — I10 Essential (primary) hypertension: Secondary | ICD-10-CM | POA: Diagnosis not present

## 2015-06-01 DIAGNOSIS — B356 Tinea cruris: Secondary | ICD-10-CM | POA: Diagnosis not present

## 2015-06-01 LAB — POCT URINALYSIS DIPSTICK
Bilirubin, UA: NEGATIVE
Blood, UA: NEGATIVE
Glucose, UA: NEGATIVE
Ketones, UA: NEGATIVE
Nitrite, UA: NEGATIVE
Protein, UA: NEGATIVE
Spec Grav, UA: 1.02
Urobilinogen, UA: 0.2
pH, UA: 6

## 2015-06-01 MED ORDER — CIPROFLOXACIN HCL 250 MG PO TABS: 250.0000 mg | ORAL_TABLET | Freq: Two times a day (BID) | ORAL | Status: DC

## 2015-06-01 MED ORDER — CLOTRIMAZOLE-BETAMETHASONE 1-0.05 % EX CREA: 1.0000 "application " | TOPICAL_CREAM | Freq: Two times a day (BID) | CUTANEOUS | Status: DC

## 2015-06-01 NOTE — Progress Notes (Signed)
Name: Stuart Jenkins   MRN: [id number]    DOB: 1940-08-07   Date:06/01/2015       Progress Note  Subjective  Chief Complaint  Chief Complaint  Patient presents with  . Hypertension    went to eye doc yesterday- had high B/P and this am was high again  . Rash    on leg  . Urinary Tract Infection    having some burning when urinating    Hypertension This is a new problem. The current episode started 1 to 4 weeks ago. The problem has been gradually worsening since onset. The problem is controlled. Pertinent negatives include no anxiety, blurred vision, chest pain, headaches, malaise/fatigue, neck pain, orthopnea, palpitations, peripheral edema, PND, shortness of breath or sweats. There are no associated agents to hypertension. Past treatments include beta blockers and calcium channel blockers. The current treatment provides mild improvement. There are no compliance problems.  There is no history of angina, kidney disease, CAD/MI, CVA, heart failure, left ventricular hypertrophy, PVD, renovascular disease or retinopathy. There is no history of chronic renal disease or a hypertension causing med.  Rash Pertinent negatives include no cough, diarrhea, fever, joint pain, shortness of breath or sore throat.  Urinary Tract Infection  This is a new problem. The current episode started in the past 7 days. The problem occurs intermittently. The quality of the pain is described as burning. There has been no fever. Associated symptoms include frequency and urgency. Pertinent negatives include no chills, hematuria, nausea or sweats. Associated symptoms comments: nocturia. The treatment provided mild relief.    No problem-specific assessment & plan notes found for this encounter.   Past Medical History  Diagnosis Date  . Gout   . Hyperlipidemia   . Hypertension   . Colon polyp   . BPH (benign prostatic hypertrophy)   . Peptic ulcer disease   . Atrial fibrillation [facility])     Past Surgical  History  Procedure Laterality Date  . Appendectomy    . Gastric resection      partial  . Fracture surgery Left     arm, knee, collar bone  . Cardiac catheterization N/A 11/08/2014    Procedure: Left Heart Cath and Coronary Angiography;  Surgeon: Yolonda Kida, MD;  Location: [place] [facility];  Service: Cardiovascular;  Laterality: N/A;  . Open heart surgery  12/05/2014  . Electrophysiologic study N/A 01/30/2015    Procedure: Cardioversion;  Surgeon: Yolonda Kida, MD;  Location: [facility];  Service: Cardiovascular;  Laterality: N/A;    Family History  Problem Relation Age of Onset  . Hypertension Brother     Social History   Social History  . Marital Status: Married    Spouse Name: N/A  . Number of Children: N/A  . Years of Education: N/A   Occupational History  . Not on file.   Social History Main Topics  . Smoking status: Former Smoker    Quit date: 11/07/1984  . Smokeless tobacco: Not on file  . Alcohol Use: Yes     Comment: less than 1 drink/week  . Drug Use: No  . Sexual Activity: Yes   Other Topics Concern  . Not on file   Social History Narrative    Allergies  Allergen Reactions  . Oxycodone Hcl Other (See Comments)    Altered mental status  . Sulfa Antibiotics Rash     Review of Systems  Constitutional: Negative for fever, chills, weight loss and malaise/fatigue.  HENT: Negative for  ear discharge, ear pain and sore throat.   Eyes: Negative for blurred vision.  Respiratory: Negative for cough, sputum production, shortness of breath and wheezing.   Cardiovascular: Negative for chest pain, palpitations, orthopnea, leg swelling and PND.  Gastrointestinal: Negative for heartburn, nausea, abdominal pain, diarrhea, constipation, blood in stool and melena.  Genitourinary: Positive for urgency and frequency. Negative for dysuria and hematuria.  Musculoskeletal: Negative for myalgias, back pain, joint pain and neck pain.  Skin: Positive for rash.   Neurological: Negative for dizziness, tingling, sensory change, focal weakness and headaches.  Endo/Heme/Allergies: Negative for environmental allergies and polydipsia. Does not bruise/bleed easily.  Psychiatric/Behavioral: Negative for depression and suicidal ideas. The patient is not nervous/anxious and does not have insomnia.      Objective  Filed Vitals:   06/01/15 1441  BP: 160/108  Pulse: 78  Height: 5\' 11"  (1.803 m)  Weight: 155 lb (70.308 kg)    Physical Exam  Constitutional: He is oriented to person, place, and time and well-developed, well-nourished, and in no distress.  HENT:  Head: Normocephalic.  Right Ear: External ear normal.  Left Ear: External ear normal.  Nose: Nose normal.  Mouth/Throat: Oropharynx is clear and moist.  Eyes: Conjunctivae and EOM are normal. Pupils are equal, round, and reactive to light. Right eye exhibits no discharge. Left eye exhibits no discharge. No scleral icterus.  Neck: Normal range of motion. Neck supple. No JVD present. No tracheal deviation present. No thyromegaly present.  Cardiovascular: Normal rate, regular rhythm, normal heart sounds and intact distal pulses.  Exam reveals no gallop and no friction rub.   No murmur heard. Pulmonary/Chest: Breath sounds normal. No respiratory distress. He has no wheezes. He has no rales.  Abdominal: Soft. Bowel sounds are normal. He exhibits no mass. There is no hepatosplenomegaly. There is no tenderness. There is no rebound, no guarding and no CVA tenderness.  Musculoskeletal: Normal range of motion. He exhibits no edema or tenderness.  Lymphadenopathy:    He has no cervical adenopathy.  Neurological: He is alert and oriented to person, place, and time. He has normal sensation, normal strength, normal reflexes and intact cranial nerves. No cranial nerve deficit.  Skin: Skin is warm. No rash noted.  Psychiatric: Mood and affect normal.  Nursing note and vitals reviewed.     Assessment &  Plan  Problem List Items Addressed This Visit    None    Visit Diagnoses    Essential hypertension    -  Primary    restart amlodipine    Tinea inguinalis        Relevant Medications    clotrimazole-betamethasone (LOTRISONE) cream    Acute cystitis without hematuria        Relevant Medications    ciprofloxacin (CIPRO) 250 MG tablet    Other Relevant Orders    POCT Urinalysis Dipstick (Completed)         Dr. [facility]  06/01/2015

## 2015-06-04 DIAGNOSIS — M542 Cervicalgia: Secondary | ICD-10-CM | POA: Diagnosis not present

## 2015-06-08 DIAGNOSIS — M542 Cervicalgia: Secondary | ICD-10-CM | POA: Diagnosis not present

## 2015-06-08 DIAGNOSIS — H2513 Age-related nuclear cataract, bilateral: Secondary | ICD-10-CM | POA: Diagnosis not present

## 2015-06-12 ENCOUNTER — Encounter: Payer: Self-pay | Admitting: Family Medicine

## 2015-06-12 ENCOUNTER — Ambulatory Visit (INDEPENDENT_AMBULATORY_CARE_PROVIDER_SITE_OTHER): Payer: Medicare HMO | Admitting: Family Medicine

## 2015-06-12 VITALS — BP 120/80 | HR 68 | Ht 71.0 in | Wt 155.0 lb

## 2015-06-12 DIAGNOSIS — I10 Essential (primary) hypertension: Secondary | ICD-10-CM | POA: Diagnosis not present

## 2015-06-12 MED ORDER — AMLODIPINE BESYLATE 5 MG PO TABS: 5.0000 mg | ORAL_TABLET | Freq: Every day | ORAL | Status: DC

## 2015-06-12 NOTE — Progress Notes (Signed)
Name: Stuart Jenkins   MRN: [id number]    DOB: December 15, 1940   Date:06/12/2015       Progress Note  Subjective  Chief Complaint  Chief Complaint  Patient presents with  . Hypertension    recheck B/P on med    Hypertension This is a new problem. The current episode started more than 1 year ago. The problem has been gradually improving since onset. The problem is controlled. Pertinent negatives include no anxiety, blurred vision, chest pain, headaches, malaise/fatigue, neck pain, orthopnea, palpitations, peripheral edema, PND, shortness of breath or sweats. There are no associated agents to hypertension. Risk factors for coronary artery disease include dyslipidemia. Past treatments include beta blockers and calcium channel blockers. The current treatment provides moderate improvement. There are no compliance problems.  There is no history of angina, kidney disease, CAD/MI, CVA, heart failure, left ventricular hypertrophy, PVD, renovascular disease or retinopathy. There is no history of chronic renal disease or a hypertension causing med.    No problem-specific assessment & plan notes found for this encounter.   Past Medical History  Diagnosis Date  . Gout   . Hyperlipidemia   . Hypertension   . Colon polyp   . BPH (benign prostatic hypertrophy)   . Peptic ulcer disease   . Atrial fibrillation [facility])     Past Surgical History  Procedure Laterality Date  . Appendectomy    . Gastric resection      partial  . Fracture surgery Left     arm, knee, collar bone  . Cardiac catheterization N/A 11/08/2014    Procedure: Left Heart Cath and Coronary Angiography;  Surgeon: Yolonda Kida, MD;  Location: [place] [facility];  Service: Cardiovascular;  Laterality: N/A;  . Open heart surgery  12/05/2014  . Electrophysiologic study N/A 01/30/2015    Procedure: Cardioversion;  Surgeon: Yolonda Kida, MD;  Location: [facility];  Service: Cardiovascular;  Laterality: N/A;    Family History   Problem Relation Age of Onset  . Hypertension Brother     Social History   Social History  . Marital Status: Married    Spouse Name: N/A  . Number of Children: N/A  . Years of Education: N/A   Occupational History  . Not on file.   Social History Main Topics  . Smoking status: Former Smoker    Quit date: 11/07/1984  . Smokeless tobacco: Not on file  . Alcohol Use: Yes     Comment: less than 1 drink/week  . Drug Use: No  . Sexual Activity: Yes   Other Topics Concern  . Not on file   Social History Narrative    Allergies  Allergen Reactions  . Oxycodone Hcl Other (See Comments)    Altered mental status  . Sulfa Antibiotics Rash     Review of Systems  Constitutional: Negative for fever, chills, weight loss and malaise/fatigue.  HENT: Negative for ear discharge, ear pain and sore throat.   Eyes: Negative for blurred vision.  Respiratory: Negative for cough, sputum production, shortness of breath and wheezing.   Cardiovascular: Negative for chest pain, palpitations, orthopnea, leg swelling and PND.  Gastrointestinal: Negative for heartburn, nausea, abdominal pain, diarrhea, constipation, blood in stool and melena.  Genitourinary: Negative for dysuria, urgency, frequency and hematuria.  Musculoskeletal: Negative for myalgias, back pain, joint pain and neck pain.  Skin: Negative for rash.  Neurological: Negative for dizziness, tingling, sensory change, focal weakness and headaches.  Endo/Heme/Allergies: Negative for environmental allergies and polydipsia. Does  not bruise/bleed easily.  Psychiatric/Behavioral: Negative for depression and suicidal ideas. The patient is not nervous/anxious and does not have insomnia.      Objective  Filed Vitals:   06/12/15 1542  BP: 120/80  Pulse: 68  Height: 5\' 11"  (1.803 m)  Weight: 155 lb (70.308 kg)    Physical Exam  Constitutional: He is oriented to person, place, and time and well-developed, well-nourished, and in no  distress.  HENT:  Head: Normocephalic.  Right Ear: External ear normal.  Left Ear: External ear normal.  Nose: Nose normal.  Mouth/Throat: Oropharynx is clear and moist.  Eyes: Conjunctivae and EOM are normal. Pupils are equal, round, and reactive to light. Right eye exhibits no discharge. Left eye exhibits no discharge. No scleral icterus.  Neck: Normal range of motion. Neck supple. No JVD present. No tracheal deviation present. No thyromegaly present.  Cardiovascular: Normal rate, regular rhythm, normal heart sounds and intact distal pulses.  Exam reveals no gallop and no friction rub.   No murmur heard. Pulmonary/Chest: Breath sounds normal. No respiratory distress. He has no wheezes. He has no rales.  Abdominal: Soft. Bowel sounds are normal. He exhibits no mass. There is no hepatosplenomegaly. There is no tenderness. There is no rebound, no guarding and no CVA tenderness.  Musculoskeletal: Normal range of motion. He exhibits no edema or tenderness.  Lymphadenopathy:    He has no cervical adenopathy.  Neurological: He is alert and oriented to person, place, and time. He has normal sensation, normal strength, normal reflexes and intact cranial nerves. No cranial nerve deficit.  Skin: Skin is warm. No rash noted.  Psychiatric: Mood and affect normal.  Nursing note and vitals reviewed.     Assessment & Plan  Problem List Items Addressed This Visit      Cardiovascular and Mediastinum   Essential hypertension - Primary   Relevant Medications   amLODipine (NORVASC) 5 MG tablet        Dr. [facility]  06/12/2015

## 2015-06-13 DIAGNOSIS — M542 Cervicalgia: Secondary | ICD-10-CM | POA: Diagnosis not present

## 2015-06-15 DIAGNOSIS — M1288 Other specific arthropathies, not elsewhere classified, other specified site: Secondary | ICD-10-CM | POA: Diagnosis not present

## 2015-07-09 DIAGNOSIS — N401 Enlarged prostate with lower urinary tract symptoms: Secondary | ICD-10-CM | POA: Diagnosis not present

## 2015-07-09 DIAGNOSIS — M189 Osteoarthritis of first carpometacarpal joint, unspecified: Secondary | ICD-10-CM | POA: Diagnosis not present

## 2015-07-09 DIAGNOSIS — M1288 Other specific arthropathies, not elsewhere classified, other specified site: Secondary | ICD-10-CM | POA: Diagnosis not present

## 2015-07-09 DIAGNOSIS — R361 Hematospermia: Secondary | ICD-10-CM | POA: Diagnosis not present

## 2015-07-09 DIAGNOSIS — S161XXA Strain of muscle, fascia and tendon at neck level, initial encounter: Secondary | ICD-10-CM | POA: Diagnosis not present

## 2015-07-09 DIAGNOSIS — M5090 Cervical disc disorder, unspecified, unspecified cervical region: Secondary | ICD-10-CM | POA: Diagnosis not present

## 2015-07-10 DIAGNOSIS — R361 Hematospermia: Secondary | ICD-10-CM | POA: Insufficient documentation

## 2015-07-25 DIAGNOSIS — I480 Paroxysmal atrial fibrillation: Secondary | ICD-10-CM | POA: Diagnosis not present

## 2015-07-25 DIAGNOSIS — I10 Essential (primary) hypertension: Secondary | ICD-10-CM | POA: Diagnosis not present

## 2015-07-25 DIAGNOSIS — Z951 Presence of aortocoronary bypass graft: Secondary | ICD-10-CM | POA: Diagnosis not present

## 2015-07-25 DIAGNOSIS — K219 Gastro-esophageal reflux disease without esophagitis: Secondary | ICD-10-CM | POA: Diagnosis not present

## 2015-07-25 DIAGNOSIS — I208 Other forms of angina pectoris: Secondary | ICD-10-CM | POA: Diagnosis not present

## 2015-07-25 DIAGNOSIS — I2510 Atherosclerotic heart disease of native coronary artery without angina pectoris: Secondary | ICD-10-CM | POA: Diagnosis not present

## 2015-07-25 DIAGNOSIS — E785 Hyperlipidemia, unspecified: Secondary | ICD-10-CM | POA: Diagnosis not present

## 2015-07-25 DIAGNOSIS — M159 Polyosteoarthritis, unspecified: Secondary | ICD-10-CM | POA: Diagnosis not present

## 2015-07-25 DIAGNOSIS — N400 Enlarged prostate without lower urinary tract symptoms: Secondary | ICD-10-CM | POA: Diagnosis not present

## 2015-07-25 DIAGNOSIS — M791 Myalgia: Secondary | ICD-10-CM | POA: Diagnosis not present

## 2015-08-01 DIAGNOSIS — M4682 Other specified inflammatory spondylopathies, cervical region: Secondary | ICD-10-CM | POA: Diagnosis not present

## 2015-08-01 DIAGNOSIS — M47812 Spondylosis without myelopathy or radiculopathy, cervical region: Secondary | ICD-10-CM | POA: Diagnosis not present

## 2015-09-05 DIAGNOSIS — M189 Osteoarthritis of first carpometacarpal joint, unspecified: Secondary | ICD-10-CM | POA: Diagnosis not present

## 2015-09-05 DIAGNOSIS — Z903 Acquired absence of stomach [part of]: Secondary | ICD-10-CM | POA: Diagnosis not present

## 2015-09-05 DIAGNOSIS — M47816 Spondylosis without myelopathy or radiculopathy, lumbar region: Secondary | ICD-10-CM | POA: Diagnosis not present

## 2015-09-05 DIAGNOSIS — M5090 Cervical disc disorder, unspecified, unspecified cervical region: Secondary | ICD-10-CM | POA: Diagnosis not present

## 2015-09-05 DIAGNOSIS — Z951 Presence of aortocoronary bypass graft: Secondary | ICD-10-CM | POA: Diagnosis not present

## 2015-09-05 DIAGNOSIS — M1288 Other specific arthropathies, not elsewhere classified, other specified site: Secondary | ICD-10-CM | POA: Diagnosis not present

## 2015-10-18 DIAGNOSIS — M180 Bilateral primary osteoarthritis of first carpometacarpal joints: Secondary | ICD-10-CM | POA: Diagnosis not present

## 2015-11-08 ENCOUNTER — Encounter: Payer: Self-pay | Admitting: Family Medicine

## 2015-11-08 ENCOUNTER — Ambulatory Visit (INDEPENDENT_AMBULATORY_CARE_PROVIDER_SITE_OTHER): Payer: Medicare HMO | Admitting: Family Medicine

## 2015-11-08 VITALS — BP 130/98 | HR 78 | Ht 71.0 in | Wt 162.0 lb

## 2015-11-08 DIAGNOSIS — M5090 Cervical disc disorder, unspecified, unspecified cervical region: Secondary | ICD-10-CM

## 2015-11-08 NOTE — Progress Notes (Signed)
Name: Stuart Jenkins   MRN: [id number]    DOB: January 18, 1941   Date:11/08/2015       Progress Note  Subjective  Chief Complaint  Chief Complaint  Patient presents with  . Follow-up    discuss having epidural with pain clinic    Patient presents for ongoing cervical disc.   Neck Pain   This is a new problem. The current episode started more than 1 month ago. The problem occurs constantly. The problem has been rapidly worsening. The pain is associated with nothing. The pain is present in the midline. The quality of the pain is described as aching. The pain is at a severity of 7/10. The pain is moderate. Pertinent negatives include no chest pain, fever, headaches, leg pain, pain with swallowing, paresis, photophobia, syncope, tingling, trouble swallowing, visual change, weakness or weight loss. The treatment provided mild relief.    No problem-specific Assessment & Plan notes found for this encounter.   Past Medical History:  Diagnosis Date  . Atrial fibrillation ([place])   . BPH (benign prostatic hypertrophy)   . Colon polyp   . Gout   . Hyperlipidemia   . Hypertension   . Peptic ulcer disease     Past Surgical History:  Procedure Laterality Date  . APPENDECTOMY    . CARDIAC CATHETERIZATION N/A 11/08/2014   Procedure: Left Heart Cath and Coronary Angiography;  Surgeon: Yolonda Kida, MD;  Location: [place] [facility];  Service: Cardiovascular;  Laterality: N/A;  . ELECTROPHYSIOLOGIC STUDY N/A 01/30/2015   Procedure: Cardioversion;  Surgeon: Yolonda Kida, MD;  Location: [facility];  Service: Cardiovascular;  Laterality: N/A;  . FRACTURE SURGERY Left    arm, knee, collar bone  . GASTRIC RESECTION     partial  . open heart surgery  12/05/2014    Family History  Problem Relation Age of Onset  . Hypertension Brother     Social History   Social History  . Marital status: Married    Spouse name: N/A  . Number of children: N/A  . Years of education: N/A    Occupational History  . Not on file.   Social History Main Topics  . Smoking status: Former Smoker    Quit date: 11/07/1984  . Smokeless tobacco: Not on file  . Alcohol use Yes     Comment: less than 1 drink/week  . Drug use: No  . Sexual activity: Yes   Other Topics Concern  . Not on file   Social History Narrative  . No narrative on file    Allergies  Allergen Reactions  . Oxycodone Hcl Other (See Comments)    Altered mental status  . Sulfa Antibiotics Rash     Review of Systems  Constitutional: Negative for chills, fever, malaise/fatigue and weight loss.  HENT: Negative for ear discharge, ear pain, sore throat and trouble swallowing.   Eyes: Negative for blurred vision and photophobia.  Respiratory: Negative for cough, sputum production, shortness of breath and wheezing.   Cardiovascular: Negative for chest pain, palpitations, leg swelling and syncope.  Gastrointestinal: Negative for abdominal pain, blood in stool, constipation, diarrhea, heartburn, melena and nausea.  Genitourinary: Negative for dysuria, frequency, hematuria and urgency.  Musculoskeletal: Positive for neck pain. Negative for back pain, joint pain and myalgias.  Skin: Negative for rash.  Neurological: Negative for dizziness, tingling, sensory change, focal weakness, weakness and headaches.  Endo/Heme/Allergies: Negative for environmental allergies and polydipsia. Does not bruise/bleed easily.  Psychiatric/Behavioral: Negative for depression and  suicidal ideas. The patient is not nervous/anxious and does not have insomnia.      Objective  Vitals:   11/08/15 1052  BP: (!) 130/98  Pulse: 78  Weight: 162 lb (73.5 kg)  Height: 5\' 11"  (1.803 m)    Physical Exam  Constitutional: He is oriented to person, place, and time and well-developed, well-nourished, and in no distress.  HENT:  Head: Normocephalic.  Right Ear: External ear normal.  Left Ear: External ear normal.  Nose: Nose normal.   Mouth/Throat: Oropharynx is clear and moist.  Eyes: Conjunctivae and EOM are normal. Pupils are equal, round, and reactive to light. Right eye exhibits no discharge. Left eye exhibits no discharge. No scleral icterus.  Neck: Normal range of motion. Neck supple. No JVD present. No tracheal deviation present. No thyromegaly present.  Cardiovascular: Normal rate, regular rhythm, normal heart sounds and intact distal pulses.  Exam reveals no gallop and no friction rub.   No murmur heard. Pulmonary/Chest: Breath sounds normal. No respiratory distress. He has no wheezes. He has no rales.  Abdominal: Soft. Bowel sounds are normal. He exhibits no mass. There is no hepatosplenomegaly. There is no tenderness. There is no rebound, no guarding and no CVA tenderness.  Musculoskeletal: Normal range of motion. He exhibits no edema or tenderness.  Lymphadenopathy:    He has no cervical adenopathy.  Neurological: He is alert and oriented to person, place, and time. He has normal sensation, normal strength and intact cranial nerves. No cranial nerve deficit.  Skin: Skin is warm. No rash noted.  Psychiatric: Mood and affect normal.  Nursing note and vitals reviewed.     Assessment & Plan  Problem List Items Addressed This Visit      Musculoskeletal and Integument   Cervical disc disorder - Primary    Other Visit Diagnoses   None.       Dr. [facility]  11/08/15

## 2015-11-08 NOTE — Patient Instructions (Addendum)
Epidural Steroid Injection Patient Information  Description: The epidural space surrounds the nerves as they exit the spinal cord.  In some patients, the nerves can be compressed and inflamed by a bulging disc or a tight spinal canal (spinal stenosis).  By injecting steroids into the epidural space, we can bring irritated nerves into direct contact with a potentially helpful medication.  These steroids act directly on the irritated nerves and can reduce swelling and inflammation which often leads to decreased pain.  Epidural steroids may be injected anywhere along the spine and from the neck to the low back depending upon the location of your pain.   After numbing the skin with local anesthetic (like Novocaine), a small needle is passed into the epidural space slowly.  You may experience a sensation of pressure while this is being done.  The entire block usually last less than 10 minutes.  Conditions which may be treated by epidural steroids:   Low back and leg pain  Neck and arm pain  Spinal stenosis  Post-laminectomy syndrome  Herpes zoster (shingles) pain  Pain from compression fractures  Preparation for the injection:  1. Do not eat any solid food or dairy products within 8 hours of your appointment.  2. You may drink clear liquids up to 3 hours before appointment.  Clear liquids include water, black coffee, juice or soda.  No milk or cream please. 3. You may take your regular medication, including pain medications, with a sip of water before your appointment  Diabetics should hold regular insulin (if taken separately) and take 1/2 normal NPH dos the morning of the procedure.  Carry some sugar containing items with you to your appointment. 4. A driver must accompany you and be prepared to drive you home after your procedure.  5. Bring all your current medications with your. 6. An IV may be inserted and sedation may be given at the discretion of the physician.   7. A blood pressure  cuff, EKG and other monitors will often be applied during the procedure.  Some patients may need to have extra oxygen administered for a short period. 8. You will be asked to provide medical information, including your allergies, prior to the procedure.  We must know immediately if you are taking blood thinners (like Coumadin/Warfarin)  Or if you are allergic to IV iodine contrast (dye). We must know if you could possible be pregnant.  Possible side-effects:  Bleeding from needle site  Infection (rare, may require surgery)  Nerve injury (rare)  Numbness & tingling (temporary)  Difficulty urinating (rare, temporary)  Spinal headache ( a headache worse with upright posture)  Light -headedness (temporary)  Pain at injection site (several days)  Decreased blood pressure (temporary)  Weakness in arm/leg (temporary)  Pressure sensation in back/neck (temporary)  Call if you experience:  Fever/chills associated with headache or increased back/neck pain.  Headache worsened by an upright position.  New onset weakness or numbness of an extremity below the injection site  Hives or difficulty breathing (go to the emergency room)  Inflammation or drainage at the infection site  Severe back/neck pain  Any new symptoms which are concerning to you  Please note:  Although the local anesthetic injected can often make your back or neck feel good for several hours after the injection, the pain will likely return.  It takes 3-7 days for steroids to work in the epidural space.  You may not notice any pain relief for at least that one week.    If effective, we will often do a series of three injections spaced 3-6 weeks apart to maximally decrease your pain.  After the initial series, we generally will wait several months before considering a repeat injection of the same type.  If you have any questions, please call [phone] [facility] Herniated Disk A  herniated disk occurs when a disk in your spine bulges out too far. This condition is also called a ruptured disk or slipped disk. Your spine (backbone) is made up of bones called vertebrae. Between each pair of vertebrae is an oval disk with a soft, spongy center that acts as a shock absorber when you move. The spongy center is surrounded by a tough outer ring. When you have a herniated disk, the spongy center of the disk bulges out or ruptures through the outer ring. A herniated disk can press on a nerve between your vertebrae and cause pain. A herniated disk can occur anywhere in your back or neck area, but the lower back is the most common spot. CAUSES  In many cases, a herniated disk occurs just from getting older. As you age, the spongy insides of your disks tend to shrink and dry out. A herniated disk can result from gradual wear and tear. Injury or sudden strain can also cause a herniated disk.  RISK FACTORS Aging is the main risk factor for a herniated disk. Other risk factors include: Being a man between the ages of 2 and 77 years. Having a job that requires heavy lifting, bending, or twisting. Having a job that requires long hours of driving. Not getting enough exercise. Being overweight. Smoking. SIGNS AND SYMPTOMS  Signs and symptoms depend on which disk is herniated. For a herniated disk in the lower back, you may have sharp pain in: One part of your leg, hip, or buttocks. The back of your calf. The top or sole of your foot (sciatica).  For a herniated disk in the neck, you may feel pain: When you move your neck. Near or over your shoulder blade. That moves to your upper arm, forearm, or fingers.  You may also have muscle weakness. It may be hard to: Lift your leg or arm. Stand on your toes. Squeeze tightly with one of your hands. Other symptoms can include: Numbness or tingling in the affected areas of your body. Loss of bladder or bowel control. This is a rare but  serious sign of a severe herniated disk in the lower back. DIAGNOSIS  Your health care provider will do a physical exam. During this exam, you may have to move certain body parts or assume various positions. For example, your health care provider may do the straight-leg test. This is a good way to test for a herniated disk in your lower back. In this test, the health care provider lifts your leg while you lie on your back. This is to see if you feel pain down your leg. Your health care provider will also check for numbness or loss of feeling. Your health care provider will also check your: Reflexes. Muscle strength. Posture. Other tests may be done to help in making a diagnosis. These may include: An X-ray of the spine to rule out other causes of back pain.  Other imaging studies, such as an MRI or CT scan. This is to check whether the herniated disk is pressing on your spinal canal. Electromyography (EMG). This test checks the nerves that control muscles. It is sometimes used to  identify the specific area of nerve involvement.  TREATMENT  In many cases, herniated disk symptoms go away over a period of days or weeks. You will most likely be free of symptoms in 3-4 months. Treatment may include the following: The initial treatment for a herniated disk is ashort period of rest. Bed rest is often limited to 1 or 2 days. Resting for too long delays recovery. If you have a herniated disk in your lower back, you should avoid sitting as much as possible because sitting increases pressure on the disk. Medicines. These may include:  Nonsteroidal anti-inflammatory drugs (NSAIDs). Muscle relaxants for back spasms. Narcotic pain medicine if your pain is very bad.  Steroid injections. You may need these along the involved nerve root to help control pain. The steroid is injected in the area of the herniated disk. It helps by reducing swelling around the disk. Physical therapy. This may include exercises  to strengthen the muscles that help support your spine.  You may need surgery if other treatments do not work.  HOME CARE INSTRUCTIONS Follow all your health care provider's instructions. These may include: Take all medicines as directed by your health care provider. Rest for 2 days and then start moving. Do not sit or stand for long periods of time. Maintain good posture when sitting and standing. Avoid movements that cause pain, such as bending or lifting. When you are able to start lifting things again: [place] with your knees. Keep your back straight. Hold heavy objects close to your body. If you are overweight, ask your health care provider to help you start a weight-loss program. When you are able to start exercising, ask your health care provider how much and what type of exercise is best for you. Work with a physical therapist on stretching and strengthening exercises for your back. Do not wear high-heeled shoes. Do not sleep on your belly. Do not smoke. Keep all follow-up visits as directed by your health care provider. SEEK MEDICAL CARE IF: You have back or neck pain that is not getting better after 4 weeks. You have very bad pain in your back or neck. You develop numbness, tingling, or weakness along with pain. SEEK IMMEDIATE MEDICAL CARE IF:  You have numbness, tingling, or weakness that makes you unable to use your arms or legs. You lose control of your bladder or bowels. You have dizziness or fainting. You have shortness of breath.  MAKE SURE YOU:  Understand these instructions. Will watch your condition. Will get help right away if you are not doing well or get worse.   This information is not intended to replace advice given to you by your health care provider. Make sure you discuss any questions you have with your health care provider.   Document Released: 03/07/2000 Document Revised: 03/31/2014 Document Reviewed: 02/11/2013 Elsevier Interactive Patient Education  [employer].

## 2015-11-12 DIAGNOSIS — N401 Enlarged prostate with lower urinary tract symptoms: Secondary | ICD-10-CM | POA: Diagnosis not present

## 2015-12-18 DIAGNOSIS — R69 Illness, unspecified: Secondary | ICD-10-CM | POA: Diagnosis not present

## 2015-12-31 ENCOUNTER — Other Ambulatory Visit: Payer: Self-pay | Admitting: Family Medicine

## 2015-12-31 DIAGNOSIS — I10 Essential (primary) hypertension: Secondary | ICD-10-CM

## 2016-01-01 DIAGNOSIS — G894 Chronic pain syndrome: Secondary | ICD-10-CM | POA: Diagnosis not present

## 2016-01-01 DIAGNOSIS — Z885 Allergy status to narcotic agent status: Secondary | ICD-10-CM | POA: Diagnosis not present

## 2016-01-01 DIAGNOSIS — M791 Myalgia: Secondary | ICD-10-CM | POA: Diagnosis not present

## 2016-01-01 DIAGNOSIS — M47812 Spondylosis without myelopathy or radiculopathy, cervical region: Secondary | ICD-10-CM | POA: Diagnosis not present

## 2016-01-01 DIAGNOSIS — I2510 Atherosclerotic heart disease of native coronary artery without angina pectoris: Secondary | ICD-10-CM | POA: Diagnosis not present

## 2016-01-01 DIAGNOSIS — Z951 Presence of aortocoronary bypass graft: Secondary | ICD-10-CM | POA: Diagnosis not present

## 2016-01-01 DIAGNOSIS — M5090 Cervical disc disorder, unspecified, unspecified cervical region: Secondary | ICD-10-CM | POA: Diagnosis not present

## 2016-01-01 DIAGNOSIS — M1811 Unilateral primary osteoarthritis of first carpometacarpal joint, right hand: Secondary | ICD-10-CM | POA: Diagnosis not present

## 2016-01-01 DIAGNOSIS — Z7982 Long term (current) use of aspirin: Secondary | ICD-10-CM | POA: Diagnosis not present

## 2016-01-01 DIAGNOSIS — M1991 Primary osteoarthritis, unspecified site: Secondary | ICD-10-CM | POA: Diagnosis not present

## 2016-02-04 DIAGNOSIS — M5412 Radiculopathy, cervical region: Secondary | ICD-10-CM | POA: Diagnosis not present

## 2016-02-04 DIAGNOSIS — M5090 Cervical disc disorder, unspecified, unspecified cervical region: Secondary | ICD-10-CM | POA: Diagnosis not present

## 2016-02-05 DIAGNOSIS — I208 Other forms of angina pectoris: Secondary | ICD-10-CM | POA: Diagnosis not present

## 2016-02-05 DIAGNOSIS — I2510 Atherosclerotic heart disease of native coronary artery without angina pectoris: Secondary | ICD-10-CM | POA: Diagnosis not present

## 2016-02-05 DIAGNOSIS — I480 Paroxysmal atrial fibrillation: Secondary | ICD-10-CM | POA: Diagnosis not present

## 2016-02-05 DIAGNOSIS — I10 Essential (primary) hypertension: Secondary | ICD-10-CM | POA: Diagnosis not present

## 2016-02-05 DIAGNOSIS — E785 Hyperlipidemia, unspecified: Secondary | ICD-10-CM | POA: Diagnosis not present

## 2016-02-05 DIAGNOSIS — R079 Chest pain, unspecified: Secondary | ICD-10-CM | POA: Diagnosis not present

## 2016-02-05 DIAGNOSIS — M791 Myalgia: Secondary | ICD-10-CM | POA: Diagnosis not present

## 2016-02-05 DIAGNOSIS — Z951 Presence of aortocoronary bypass graft: Secondary | ICD-10-CM | POA: Diagnosis not present

## 2016-02-05 DIAGNOSIS — K219 Gastro-esophageal reflux disease without esophagitis: Secondary | ICD-10-CM | POA: Diagnosis not present

## 2016-02-05 DIAGNOSIS — M159 Polyosteoarthritis, unspecified: Secondary | ICD-10-CM | POA: Diagnosis not present

## 2016-03-10 DIAGNOSIS — Z885 Allergy status to narcotic agent status: Secondary | ICD-10-CM | POA: Diagnosis not present

## 2016-03-10 DIAGNOSIS — M1811 Unilateral primary osteoarthritis of first carpometacarpal joint, right hand: Secondary | ICD-10-CM | POA: Diagnosis not present

## 2016-03-10 DIAGNOSIS — M47812 Spondylosis without myelopathy or radiculopathy, cervical region: Secondary | ICD-10-CM | POA: Diagnosis not present

## 2016-03-10 DIAGNOSIS — M1991 Primary osteoarthritis, unspecified site: Secondary | ICD-10-CM | POA: Diagnosis not present

## 2016-03-10 DIAGNOSIS — Z7982 Long term (current) use of aspirin: Secondary | ICD-10-CM | POA: Diagnosis not present

## 2016-03-10 DIAGNOSIS — M791 Myalgia: Secondary | ICD-10-CM | POA: Diagnosis not present

## 2016-03-10 DIAGNOSIS — M5090 Cervical disc disorder, unspecified, unspecified cervical region: Secondary | ICD-10-CM | POA: Diagnosis not present

## 2016-05-05 ENCOUNTER — Other Ambulatory Visit: Payer: Self-pay

## 2016-05-05 DIAGNOSIS — I10 Essential (primary) hypertension: Secondary | ICD-10-CM

## 2016-05-05 MED ORDER — AMLODIPINE BESYLATE 5 MG PO TABS: 5.0000 mg | ORAL_TABLET | Freq: Every day | ORAL | 0 refills | Status: DC

## 2016-05-16 DIAGNOSIS — L821 Other seborrheic keratosis: Secondary | ICD-10-CM | POA: Diagnosis not present

## 2016-06-02 ENCOUNTER — Other Ambulatory Visit: Payer: Self-pay | Admitting: Family Medicine

## 2016-06-02 DIAGNOSIS — I10 Essential (primary) hypertension: Secondary | ICD-10-CM

## 2016-06-28 ENCOUNTER — Other Ambulatory Visit: Payer: Self-pay | Admitting: Family Medicine

## 2016-06-28 DIAGNOSIS — I10 Essential (primary) hypertension: Secondary | ICD-10-CM

## 2016-07-21 DIAGNOSIS — H2511 Age-related nuclear cataract, right eye: Secondary | ICD-10-CM | POA: Diagnosis not present

## 2016-07-23 DIAGNOSIS — I10 Essential (primary) hypertension: Secondary | ICD-10-CM | POA: Diagnosis not present

## 2016-07-23 DIAGNOSIS — E785 Hyperlipidemia, unspecified: Secondary | ICD-10-CM | POA: Diagnosis not present

## 2016-07-23 DIAGNOSIS — I2510 Atherosclerotic heart disease of native coronary artery without angina pectoris: Secondary | ICD-10-CM | POA: Diagnosis not present

## 2016-07-23 DIAGNOSIS — M791 Myalgia: Secondary | ICD-10-CM | POA: Diagnosis not present

## 2016-07-23 DIAGNOSIS — I208 Other forms of angina pectoris: Secondary | ICD-10-CM | POA: Diagnosis not present

## 2016-07-23 DIAGNOSIS — M159 Polyosteoarthritis, unspecified: Secondary | ICD-10-CM | POA: Diagnosis not present

## 2016-07-23 DIAGNOSIS — R079 Chest pain, unspecified: Secondary | ICD-10-CM | POA: Diagnosis not present

## 2016-07-23 DIAGNOSIS — Z951 Presence of aortocoronary bypass graft: Secondary | ICD-10-CM | POA: Diagnosis not present

## 2016-07-23 DIAGNOSIS — K219 Gastro-esophageal reflux disease without esophagitis: Secondary | ICD-10-CM | POA: Diagnosis not present

## 2016-07-23 DIAGNOSIS — I480 Paroxysmal atrial fibrillation: Secondary | ICD-10-CM | POA: Diagnosis not present

## 2016-07-26 ENCOUNTER — Other Ambulatory Visit: Payer: Self-pay | Admitting: Family Medicine

## 2016-07-26 DIAGNOSIS — I10 Essential (primary) hypertension: Secondary | ICD-10-CM

## 2016-09-11 ENCOUNTER — Ambulatory Visit (INDEPENDENT_AMBULATORY_CARE_PROVIDER_SITE_OTHER): Payer: Medicare HMO | Admitting: Family Medicine

## 2016-09-11 ENCOUNTER — Encounter: Payer: Self-pay | Admitting: Family Medicine

## 2016-09-11 VITALS — BP 120/80 | HR 64 | Ht 71.0 in | Wt 164.0 lb

## 2016-09-11 DIAGNOSIS — I10 Essential (primary) hypertension: Secondary | ICD-10-CM | POA: Diagnosis not present

## 2016-09-11 DIAGNOSIS — Z6822 Body mass index (BMI) 22.0-22.9, adult: Secondary | ICD-10-CM

## 2016-09-11 DIAGNOSIS — N400 Enlarged prostate without lower urinary tract symptoms: Secondary | ICD-10-CM | POA: Diagnosis not present

## 2016-09-11 DIAGNOSIS — N401 Enlarged prostate with lower urinary tract symptoms: Secondary | ICD-10-CM | POA: Insufficient documentation

## 2016-09-11 DIAGNOSIS — E782 Mixed hyperlipidemia: Secondary | ICD-10-CM | POA: Diagnosis not present

## 2016-09-11 MED ORDER — ATORVASTATIN CALCIUM 80 MG PO TABS: 80.0000 mg | ORAL_TABLET | Freq: Every day | ORAL | 1 refills | Status: DC

## 2016-09-11 MED ORDER — METOPROLOL SUCCINATE ER 25 MG PO TB24: 25.0000 mg | ORAL_TABLET | Freq: Every day | ORAL | 1 refills | Status: DC

## 2016-09-11 MED ORDER — AMLODIPINE BESYLATE 5 MG PO TABS: 5.0000 mg | ORAL_TABLET | Freq: Every day | ORAL | 1 refills | Status: DC

## 2016-09-11 NOTE — Progress Notes (Signed)
Name: NIDAL RIVET   MRN: [id number]    DOB: 15-Jul-1940   Date:09/11/2016       Progress Note  Subjective  Chief Complaint  Chief Complaint  Patient presents with  . Hypertension  . Hyperlipidemia  . Benign Prostatic Hypertrophy    Hypertension  This is a chronic problem. The current episode started more than 1 year ago. The problem is unchanged. The problem is controlled. Associated symptoms include blurred vision. Pertinent negatives include no anxiety, chest pain, headaches, malaise/fatigue, neck pain, orthopnea, palpitations, peripheral edema, PND, shortness of breath or sweats. There are no associated agents to hypertension. Risk factors for coronary artery disease include dyslipidemia. Past treatments include beta blockers and calcium channel blockers. The current treatment provides moderate improvement. There are no compliance problems.  There is no history of angina, kidney disease, CAD/MI, CVA, heart failure, left ventricular hypertrophy, PVD or retinopathy. There is no history of chronic renal disease, hyperaldosteronism or a hypertension causing med.  Hyperlipidemia  This is a chronic problem. The problem is controlled. Recent lipid tests were reviewed and are normal. He has no history of chronic renal disease. Pertinent negatives include no chest pain, focal sensory loss, focal weakness, leg pain, myalgias or shortness of breath. Current antihyperlipidemic treatment includes statins. The current treatment provides moderate improvement of lipids. There are no compliance problems.  Risk factors for coronary artery disease include hypertension, dyslipidemia and male sex.  Benign Prostatic Hypertrophy  This is a chronic problem. The problem has been gradually improving since onset. Irritative symptoms do not include frequency, nocturia or urgency. Obstructive symptoms do not include dribbling, an intermittent stream, a slower stream, straining or a weak stream. Pertinent negatives  include no chills, dysuria, hematuria or nausea.    No problem-specific Assessment & Plan notes found for this encounter.   Past Medical History:  Diagnosis Date  . Atrial fibrillation ([place])   . BPH (benign prostatic hypertrophy)   . Colon polyp   . Gout   . Hyperlipidemia   . Hypertension   . Peptic ulcer disease     Past Surgical History:  Procedure Laterality Date  . APPENDECTOMY    . CARDIAC CATHETERIZATION N/A 11/08/2014   Procedure: Left Heart Cath and Coronary Angiography;  Surgeon: Yolonda Kida, MD;  Location: [facility];  Service: Cardiovascular;  Laterality: N/A;  . ELECTROPHYSIOLOGIC STUDY N/A 01/30/2015   Procedure: Cardioversion;  Surgeon: Yolonda Kida, MD;  Location: [facility];  Service: Cardiovascular;  Laterality: N/A;  . FRACTURE SURGERY Left    arm, knee, collar bone  . GASTRIC RESECTION     partial  . open heart surgery  12/05/2014    Family History  Problem Relation Age of Onset  . Hypertension Brother     Social History   Social History  . Marital status: Married    Spouse name: N/A  . Number of children: N/A  . Years of education: N/A   Occupational History  . Not on file.   Social History Main Topics  . Smoking status: Former Smoker    Quit date: 11/07/1984  . Smokeless tobacco: Never Used  . Alcohol use Yes     Comment: less than 1 drink/week  . Drug use: No  . Sexual activity: Yes   Other Topics Concern  . Not on file   Social History Narrative  . No narrative on file    Allergies  Allergen Reactions  . Oxycodone Hcl Other (See Comments)  Altered mental status  . Sulfa Antibiotics Rash    Outpatient Medications Prior to Visit  Medication Sig Dispense Refill  . aspirin EC 81 MG tablet Take 81 mg by mouth daily.    . tamsulosin (FLOMAX) 0.4 MG CAPS capsule Take 0.4 mg by mouth 2 (two) times daily.     Marland Kitchen amLODipine (NORVASC) 5 MG tablet TAKE 1 TABLET BY MOUTH EVERY DAY 30 tablet 0  . amLODipine  (NORVASC) 5 MG tablet TAKE 1 TABLET BY MOUTH EVERY DAY. NEED APPT/LABS FOR REFILLS 30 tablet 0  . atorvastatin (LIPITOR) 40 MG tablet Take 40 mg by mouth at bedtime.  0  . ferrous sulfate 324 (65 FE) MG TBEC Take 324 mg by mouth 2 (two) times daily.    Marland Kitchen tiZANidine (ZANAFLEX) 2 MG tablet Take 2 mg by mouth every 6 (six) hours as needed. Spine center    . traMADol (ULTRAM) 50 MG tablet Take 50 mg by mouth every 6 (six) hours as needed. Pain clinic     No facility-administered medications prior to visit.     Review of Systems  Constitutional: Negative for chills, fever, malaise/fatigue and weight loss.  HENT: Negative for ear discharge, ear pain and sore throat.   Eyes: Positive for blurred vision.  Respiratory: Negative for cough, sputum production, shortness of breath and wheezing.   Cardiovascular: Negative for chest pain, palpitations, orthopnea, leg swelling and PND.  Gastrointestinal: Negative for abdominal pain, blood in stool, constipation, diarrhea, heartburn, melena and nausea.  Genitourinary: Negative for dysuria, frequency, hematuria, nocturia and urgency.  Musculoskeletal: Negative for back pain, joint pain, myalgias and neck pain.  Skin: Negative for rash.  Neurological: Negative for dizziness, tingling, sensory change, focal weakness and headaches.  Endo/Heme/Allergies: Negative for environmental allergies and polydipsia. Does not bruise/bleed easily.  Psychiatric/Behavioral: Negative for depression and suicidal ideas. The patient is not nervous/anxious and does not have insomnia.      Objective  Vitals:   09/11/16 0913  BP: 120/80  Pulse: 64  Weight: 164 lb (74.4 kg)  Height: 5\' 11"  (1.803 m)    Physical Exam  Constitutional: He is oriented to person, place, and time and well-developed, well-nourished, and in no distress.  HENT:  Head: Normocephalic.  Right Ear: External ear normal.  Left Ear: External ear normal.  Nose: Nose normal.  Mouth/Throat: Oropharynx  is clear and moist.  Eyes: Conjunctivae and EOM are normal. Pupils are equal, round, and reactive to light. Right eye exhibits no discharge. Left eye exhibits no discharge. No scleral icterus.  Neck: Normal range of motion. Neck supple. No JVD present. No tracheal deviation present. No thyromegaly present.  Cardiovascular: Normal rate, regular rhythm, normal heart sounds and intact distal pulses.  Exam reveals no gallop and no friction rub.   No murmur heard. Pulmonary/Chest: Breath sounds normal. No respiratory distress. He has no wheezes. He has no rales.  Abdominal: Soft. Bowel sounds are normal. He exhibits no mass. There is no hepatosplenomegaly. There is no tenderness. There is no rebound, no guarding and no CVA tenderness.  Musculoskeletal: Normal range of motion. He exhibits no edema or tenderness.  Lymphadenopathy:    He has no cervical adenopathy.  Neurological: He is alert and oriented to person, place, and time. He has normal sensation, normal strength, normal reflexes and intact cranial nerves. No cranial nerve deficit.  Skin: Skin is warm. No rash noted.  Psychiatric: Mood and affect normal.  Nursing note and vitals reviewed.     Assessment &  Plan  Problem List Items Addressed This Visit      Cardiovascular and Mediastinum   Essential hypertension - Primary   Relevant Medications   amLODipine (NORVASC) 5 MG tablet   metoprolol succinate (TOPROL-XL) 25 MG 24 hr tablet   atorvastatin (LIPITOR) 80 MG tablet   Other Relevant Orders   Renal Function Panel     Genitourinary   Benign prostatic hyperplasia without lower urinary tract symptoms     Other   Mixed hyperlipidemia   Relevant Medications   amLODipine (NORVASC) 5 MG tablet   metoprolol succinate (TOPROL-XL) 25 MG 24 hr tablet   atorvastatin (LIPITOR) 80 MG tablet   Other Relevant Orders   Lipid Profile    Other Visit Diagnoses    BMI 22.0-22.9, adult          Meds ordered this encounter  Medications  .  amLODipine (NORVASC) 5 MG tablet    Sig: Take 1 tablet (5 mg total) by mouth daily.    Dispense:  90 tablet    Refill:  1  . metoprolol succinate (TOPROL-XL) 25 MG 24 hr tablet    Sig: Take 1 tablet (25 mg total) by mouth at bedtime.    Dispense:  90 tablet    Refill:  1  . atorvastatin (LIPITOR) 80 MG tablet    Sig: Take 1 tablet (80 mg total) by mouth daily.    Dispense:  90 tablet    Refill:  1      Dr. Otilio Miu [facility]  09/11/16

## 2016-09-12 LAB — RENAL FUNCTION PANEL
ALBUMIN: 4.4 g/dL (ref 3.5–4.8)
BUN/Creatinine Ratio: 11 (ref 10–24)
BUN: 11 mg/dL (ref 8–27)
CO2: 22 mmol/L (ref 20–29)
CREATININE: 0.98 mg/dL (ref 0.76–1.27)
Calcium: 9.3 mg/dL (ref 8.6–10.2)
Chloride: 101 mmol/L (ref 96–106)
GFR, EST AFRICAN AMERICAN: 87 mL/min/{1.73_m2} (ref >59)
GFR, EST NON AFRICAN AMERICAN: 75 mL/min/{1.73_m2} (ref >59)
GLUCOSE: 92 mg/dL (ref 65–99)
POTASSIUM: 4.8 mmol/L (ref 3.5–5.2)
Phosphorus: 3.2 mg/dL (ref 2.5–4.5)
Sodium: 138 mmol/L (ref 134–144)

## 2016-09-12 LAB — LIPID PANEL
CHOL/HDL RATIO: 2.8 ratio (ref 0.0–5.0)
Cholesterol, Total: 88 mg/dL — ABNORMAL LOW (ref 100–199)
HDL: 31 mg/dL — AB (ref >39)
LDL CALC: 30 mg/dL (ref 0–99)
Triglycerides: 135 mg/dL (ref 0–149)
VLDL Cholesterol Cal: 27 mg/dL (ref 5–40)

## 2016-10-31 DIAGNOSIS — Z86010 Personal history of colonic polyps: Secondary | ICD-10-CM | POA: Diagnosis not present

## 2016-10-31 DIAGNOSIS — D649 Anemia, unspecified: Secondary | ICD-10-CM | POA: Diagnosis not present

## 2016-11-10 DIAGNOSIS — D649 Anemia, unspecified: Secondary | ICD-10-CM | POA: Diagnosis not present

## 2016-11-12 DIAGNOSIS — N138 Other obstructive and reflux uropathy: Secondary | ICD-10-CM | POA: Diagnosis not present

## 2016-11-12 DIAGNOSIS — N401 Enlarged prostate with lower urinary tract symptoms: Secondary | ICD-10-CM | POA: Diagnosis not present

## 2016-12-24 DIAGNOSIS — R69 Illness, unspecified: Secondary | ICD-10-CM | POA: Diagnosis not present

## 2017-01-27 DIAGNOSIS — M791 Myalgia, unspecified site: Secondary | ICD-10-CM | POA: Diagnosis not present

## 2017-01-27 DIAGNOSIS — K219 Gastro-esophageal reflux disease without esophagitis: Secondary | ICD-10-CM | POA: Diagnosis not present

## 2017-01-27 DIAGNOSIS — Z951 Presence of aortocoronary bypass graft: Secondary | ICD-10-CM | POA: Diagnosis not present

## 2017-01-27 DIAGNOSIS — I2510 Atherosclerotic heart disease of native coronary artery without angina pectoris: Secondary | ICD-10-CM | POA: Diagnosis not present

## 2017-01-27 DIAGNOSIS — M159 Polyosteoarthritis, unspecified: Secondary | ICD-10-CM | POA: Diagnosis not present

## 2017-01-27 DIAGNOSIS — R079 Chest pain, unspecified: Secondary | ICD-10-CM | POA: Diagnosis not present

## 2017-01-27 DIAGNOSIS — E785 Hyperlipidemia, unspecified: Secondary | ICD-10-CM | POA: Diagnosis not present

## 2017-01-27 DIAGNOSIS — I10 Essential (primary) hypertension: Secondary | ICD-10-CM | POA: Diagnosis not present

## 2017-01-27 DIAGNOSIS — I480 Paroxysmal atrial fibrillation: Secondary | ICD-10-CM | POA: Diagnosis not present

## 2017-01-27 DIAGNOSIS — I208 Other forms of angina pectoris: Secondary | ICD-10-CM | POA: Diagnosis not present

## 2017-02-20 ENCOUNTER — Encounter: Payer: Self-pay | Admitting: *Deleted

## 2017-02-23 ENCOUNTER — Encounter: Payer: Self-pay | Admitting: *Deleted

## 2017-02-23 ENCOUNTER — Ambulatory Visit: Payer: Medicare HMO | Admitting: Anesthesiology

## 2017-02-23 ENCOUNTER — Ambulatory Visit
Admission: RE | Admit: 2017-02-23 | Discharge: 2017-02-23 | Disposition: A | Discharge status: Home / Self Care | Payer: Medicare HMO | Source: Ambulatory Visit | Attending: Gastroenterology | Admitting: Gastroenterology

## 2017-02-23 ENCOUNTER — Encounter
Admission: RE | Disposition: A | Discharge status: Home / Self Care | Payer: Self-pay | Source: Ambulatory Visit | Attending: Gastroenterology

## 2017-02-23 DIAGNOSIS — M109 Gout, unspecified: Secondary | ICD-10-CM | POA: Insufficient documentation

## 2017-02-23 DIAGNOSIS — D649 Anemia, unspecified: Secondary | ICD-10-CM | POA: Diagnosis not present

## 2017-02-23 DIAGNOSIS — Z7982 Long term (current) use of aspirin: Secondary | ICD-10-CM | POA: Diagnosis not present

## 2017-02-23 DIAGNOSIS — N400 Enlarged prostate without lower urinary tract symptoms: Secondary | ICD-10-CM | POA: Insufficient documentation

## 2017-02-23 DIAGNOSIS — K635 Polyp of colon: Secondary | ICD-10-CM | POA: Diagnosis not present

## 2017-02-23 DIAGNOSIS — K621 Rectal polyp: Secondary | ICD-10-CM | POA: Insufficient documentation

## 2017-02-23 DIAGNOSIS — E785 Hyperlipidemia, unspecified: Secondary | ICD-10-CM | POA: Insufficient documentation

## 2017-02-23 DIAGNOSIS — Z882 Allergy status to sulfonamides status: Secondary | ICD-10-CM | POA: Insufficient documentation

## 2017-02-23 DIAGNOSIS — Z87891 Personal history of nicotine dependence: Secondary | ICD-10-CM | POA: Diagnosis not present

## 2017-02-23 DIAGNOSIS — I10 Essential (primary) hypertension: Secondary | ICD-10-CM | POA: Diagnosis not present

## 2017-02-23 DIAGNOSIS — Z885 Allergy status to narcotic agent status: Secondary | ICD-10-CM | POA: Insufficient documentation

## 2017-02-23 DIAGNOSIS — I4891 Unspecified atrial fibrillation: Secondary | ICD-10-CM | POA: Insufficient documentation

## 2017-02-23 DIAGNOSIS — K5790 Diverticulosis of intestine, part unspecified, without perforation or abscess without bleeding: Secondary | ICD-10-CM | POA: Diagnosis not present

## 2017-02-23 DIAGNOSIS — Z7983 Long term (current) use of bisphosphonates: Secondary | ICD-10-CM | POA: Insufficient documentation

## 2017-02-23 DIAGNOSIS — Z86010 Personal history of colonic polyps: Secondary | ICD-10-CM | POA: Insufficient documentation

## 2017-02-23 DIAGNOSIS — Z881 Allergy status to other antibiotic agents status: Secondary | ICD-10-CM | POA: Diagnosis not present

## 2017-02-23 DIAGNOSIS — Z79899 Other long term (current) drug therapy: Secondary | ICD-10-CM | POA: Diagnosis not present

## 2017-02-23 DIAGNOSIS — I2510 Atherosclerotic heart disease of native coronary artery without angina pectoris: Secondary | ICD-10-CM | POA: Diagnosis not present

## 2017-02-23 DIAGNOSIS — K5730 Diverticulosis of large intestine without perforation or abscess without bleeding: Secondary | ICD-10-CM | POA: Diagnosis not present

## 2017-02-23 HISTORY — DX: Atherosclerotic heart disease of native coronary artery without angina pectoris: I25.10

## 2017-02-23 HISTORY — PX: COLONOSCOPY WITH PROPOFOL: SHX5780

## 2017-02-23 SURGERY — COLONOSCOPY WITH PROPOFOL
Anesthesia: General

## 2017-02-23 MED ORDER — MIDAZOLAM HCL 2 MG/2ML IJ SOLN
INTRAMUSCULAR | Status: AC
  Filled 2017-02-23: qty 2

## 2017-02-23 MED ORDER — MIDAZOLAM HCL 2 MG/2ML IJ SOLN
INTRAMUSCULAR | Status: DC | PRN
  Administered 2017-02-23: 2 mg via INTRAVENOUS

## 2017-02-23 MED ORDER — PROPOFOL 10 MG/ML IV BOLUS
INTRAVENOUS | Status: DC | PRN
  Administered 2017-02-23: 20 mg via INTRAVENOUS
  Administered 2017-02-23: 50 mg via INTRAVENOUS

## 2017-02-23 MED ORDER — SODIUM CHLORIDE 0.9 % IV SOLN
INTRAVENOUS | Status: DC
  Administered 2017-02-23: 11:00:00 via INTRAVENOUS

## 2017-02-23 MED ORDER — PROPOFOL 500 MG/50ML IV EMUL
INTRAVENOUS | Status: AC
Start: 2017-02-23 — End: 2017-02-23
  Filled 2017-02-23: qty 50

## 2017-02-23 MED ORDER — FENTANYL CITRATE (PF) 100 MCG/2ML IJ SOLN
INTRAMUSCULAR | Status: AC
  Filled 2017-02-23: qty 2

## 2017-02-23 MED ORDER — SODIUM CHLORIDE 0.9 % IV SOLN
INTRAVENOUS | Status: DC
  Administered 2017-02-23: 1000 mL via INTRAVENOUS
  Administered 2017-02-23: 11:00:00 via INTRAVENOUS

## 2017-02-23 MED ORDER — FENTANYL CITRATE (PF) 100 MCG/2ML IJ SOLN
INTRAMUSCULAR | Status: DC | PRN
  Administered 2017-02-23: 50 ug via INTRAVENOUS

## 2017-02-23 MED ORDER — PROPOFOL 500 MG/50ML IV EMUL
INTRAVENOUS | Status: DC | PRN
  Administered 2017-02-23: 150 ug/kg/min via INTRAVENOUS

## 2017-02-23 NOTE — Op Note (Signed)
[facility] Patient Name: Stuart Jenkins Procedure Date: 02/23/2017 10:51 AM MRN: [id number] Account #: [id number] Date of Birth: [birth year] Admit Type: Outpatient Age: 76 Room: [facility] ENDO ROOM 3 Gender: Male Note Status: Finalized Procedure:            Colonoscopy Indications:          Personal history of colonic polyps Providers:            Lollie Sails, MD Referring MD:         Juline Patch, MD (Referring MD) Medicines:            Monitored Anesthesia Care Complications:        No immediate complications. Procedure:            Pre-Anesthesia Assessment:                       - ASA Grade Assessment: III - A patient with severe                        systemic disease.                       After obtaining informed consent, the colonoscope was                        passed under direct vision. Throughout the procedure,                        the patient's blood pressure, pulse, and oxygen                        saturations were monitored continuously. The                        Colonoscope was introduced through the anus and                        advanced to the the cecum, identified by appendiceal                        orifice and ileocecal valve. The colonoscopy was                        performed with moderate difficulty due to significant                        looping and a tortuous colon. Successful completion of                        the procedure was aided by changing the patient to a                        supine position and using manual pressure. The patient                        tolerated the procedure well. The quality of the bowel                        preparation was fair. Findings:      A less than 1 mm polyp was found  in the rectum. The polyp was sessile.       The polyp was removed with a cold biopsy forceps. Resection and       retrieval were complete.      A few small-mouthed diverticula were found in the  sigmoid colon.      The digital rectal exam was normal. Impression:           - Preparation of the colon was fair.                       - One less than 1 mm polyp in the rectum, removed with                        a cold biopsy forceps. Resected and retrieved.                       - Diverticulosis in the sigmoid colon. Recommendation:       - Return to GI clinic in 4-6 weeks, recheck hemogram. Procedure Code(s):    --- Professional ---                       [phone], Colonoscopy, flexible; with biopsy, single or                        multiple Diagnosis Code(s):    --- Professional ---                       K62.1, Rectal polyp                       Z86.010, Personal history of colonic polyps                       K57.30, Diverticulosis of large intestine without                        perforation or abscess without bleeding CPT copyright 2016 American [employer]. All rights reserved. The codes documented in this report are preliminary and upon coder review may  be revised to meet current compliance requirements. Lollie Sails, MD 02/23/2017 11:41:53 AM This report has been signed electronically. Number of Addenda: 0 Note Initiated On: 02/23/2017 10:51 AM Scope Withdrawal Time: 0 hours 11 minutes 31 seconds  Total Procedure Duration: 0 hours 25 minutes 52 seconds       [facility]

## 2017-02-23 NOTE — Anesthesia Procedure Notes (Signed)
Date/Time: 02/23/2017 10:50 AM Performed by: Allean Found, CRNA Pre-anesthesia Checklist: Patient identified, Emergency Drugs available, Suction available, Patient being monitored and Timeout performed Patient Re-evaluated:Patient Re-evaluated prior to induction Oxygen Delivery Method: Nasal cannula Placement Confirmation: positive ETCO2

## 2017-02-23 NOTE — Transfer of Care (Signed)
Immediate Anesthesia Transfer of Care Note  Patient: Stuart Jenkins  Procedure(s) Performed: COLONOSCOPY WITH PROPOFOL (N/A )  Patient Location: PACU  Anesthesia Type:General  Level of Consciousness: drowsy  Airway & Oxygen Therapy: Patient Spontanous Breathing and Patient connected to nasal cannula oxygen  Post-op Assessment: Report given to RN and Post -op Vital signs reviewed and stable  Post vital signs: Reviewed and stable  Last Vitals:  Vitals:   02/23/17 0949  BP: (!) 131/93  Pulse: 66  Resp: 18  Temp: (!) 35.7 C  SpO2: 100%    Last Pain:  Vitals:   02/23/17 0949  TempSrc: Tympanic         Complications: No apparent anesthesia complications

## 2017-02-23 NOTE — Anesthesia Preprocedure Evaluation (Signed)
Anesthesia Evaluation  Patient identified by MRN, date of birth, ID band Patient awake    Reviewed: Allergy & Precautions, H&P , NPO status , Patient's Chart, lab work & pertinent test results, reviewed documented beta blocker date and time   Airway Mallampati: II   Neck ROM: full    Dental  (+) Poor Dentition   Pulmonary neg pulmonary ROS, former smoker,    Pulmonary exam normal        Cardiovascular hypertension, negative cardio ROS Normal cardiovascular exam Rhythm:regular Rate:Normal     Neuro/Psych negative neurological ROS  negative psych ROS   GI/Hepatic negative GI ROS, Neg liver ROS, PUD,   Endo/Other  negative endocrine ROS  Renal/GU negative Renal ROS  negative genitourinary   Musculoskeletal   Abdominal   Peds  Hematology negative hematology ROS (+) anemia ,   Anesthesia Other Findings Past Medical History: No date: Atrial fibrillation (HCC) No date: BPH (benign prostatic hypertrophy) No date: Colon polyp No date: Coronary artery disease No date: Gout No date: Hyperlipidemia No date: Hypertension No date: Peptic ulcer disease Past Surgical History: No date: APPENDECTOMY 11/08/2014: CARDIAC CATHETERIZATION; N/A     Comment:  Procedure: Left Heart Cath and Coronary Angiography;                Surgeon: Yolonda Kida, MD;  Location: [facility]               [facility];  Service: Cardiovascular;  Laterality: N/A; No date: CORONARY ANGIOPLASTY 01/30/2015: ELECTROPHYSIOLOGIC STUDY; N/A     Comment:  Procedure: Cardioversion;  Surgeon: Yolonda Kida,               MD;  Location: [facility];  Service: Cardiovascular;                Laterality: N/A; No date: FRACTURE SURGERY; Left     Comment:  arm, knee, collar bone No date: FRACTURE SURGERY No date: GASTRIC RESECTION     Comment:  partial 12/05/2014: open heart surgery   Reproductive/Obstetrics negative OB ROS                             Anesthesia Physical Anesthesia Plan  ASA: III  Anesthesia Plan: General   Post-op Pain Management:    Induction:   PONV Risk Score and Plan:   Airway Management Planned:   Additional Equipment:   Intra-op Plan:   Post-operative Plan:   Informed Consent: I have reviewed the patients History and Physical, chart, labs and discussed the procedure including the risks, benefits and alternatives for the proposed anesthesia with the patient or authorized representative who has indicated his/her understanding and acceptance.   Dental Advisory Given  Plan Discussed with: CRNA  Anesthesia Plan Comments:         Anesthesia Quick Evaluation

## 2017-02-23 NOTE — OR Nursing (Signed)
Second bag of IV fluid hung, patient had gotten  A liter in preop.

## 2017-02-23 NOTE — Anesthesia Post-op Follow-up Note (Signed)
Anesthesia QCDR form completed.        

## 2017-02-23 NOTE — H&P (Signed)
Outpatient short stay form Pre-procedure 02/23/2017 10:57 AM Lollie Sails MD  Primary Physician: Dr. Otilio Miu  Reason for visit:  Colonoscopy  History of present illness:  Patient is a 76 year old male presenting today as above. He has personal history of adenomatous colon polyps. He tolerated his prep well. He takes no aspirin or blood thinning agents with the exception of 81 mg aspirin this been held today.    Current Facility-Administered Medications:  .  0.9 %  sodium chloride infusion, , Intravenous, Continuous, Lollie Sails, MD, Last Rate: 20 mL/hr at 02/23/17 1005, 1,000 mL at 02/23/17 1005 .  0.9 %  sodium chloride infusion, , Intravenous, Continuous, Lollie Sails, MD  Medications Prior to Admission  Medication Sig Dispense Refill Last Dose  . amLODipine (NORVASC) 5 MG tablet Take 1 tablet (5 mg total) by mouth daily. 90 tablet 1 02/22/2017 at Unknown time  . aspirin EC 81 MG tablet Take 81 mg by mouth daily.   Past Week at Unknown time  . atorvastatin (LIPITOR) 80 MG tablet Take 1 tablet (80 mg total) by mouth daily. 90 tablet 1 02/22/2017 at Unknown time  . metoprolol succinate (TOPROL-XL) 25 MG 24 hr tablet Take 1 tablet (25 mg total) by mouth at bedtime. 90 tablet 1 Past Week at Unknown time  . polyethylene glycol powder (GLYCOLAX/MIRALAX) powder Take 1 Container by mouth once.   02/22/2017 at Unknown time  . tamsulosin (FLOMAX) 0.4 MG CAPS capsule Take 0.4 mg by mouth 2 (two) times daily.    Past Week at Unknown time     Allergies  Allergen Reactions  . Oxycodone Hcl Other (See Comments)    Altered mental status  . Sulfa Antibiotics Rash     Past Medical History:  Diagnosis Date  . Atrial fibrillation ([place])   . BPH (benign prostatic hypertrophy)   . Colon polyp   . Coronary artery disease   . Gout   . Hyperlipidemia   . Hypertension   . Peptic ulcer disease     Review of systems:      Physical Exam    Heart and lungs: Regular rate and  rhythm without rub or gallop, lungs are bilaterally clear.    HEENT: Normocephalic atraumatic eyes are anicteric    Other:     Pertinant exam for procedure: Soft nontender nondistended bowel sounds positive normoactive.    Planned proceedures: Colonoscopy and indicated procedures. I have discussed the risks benefits and complications of procedures to include not limited to bleeding, infection, perforation and the risk of sedation and the patient wishes to proceed.    Lollie Sails, MD Gastroenterology 02/23/2017  10:57 AM

## 2017-02-23 NOTE — Anesthesia Postprocedure Evaluation (Signed)
Anesthesia Post Note  Patient: Stuart Jenkins  Procedure(Jenkins) Performed: COLONOSCOPY WITH PROPOFOL (N/A )  Patient location during evaluation: Endoscopy Anesthesia Type: General Level of consciousness: awake and alert Pain management: pain level controlled Vital Signs Assessment: post-procedure vital signs reviewed and stable Respiratory status: spontaneous breathing, nonlabored ventilation, respiratory function stable and patient connected to nasal cannula oxygen Cardiovascular status: blood pressure returned to baseline and stable Postop Assessment: no apparent nausea or vomiting Anesthetic complications: no     Last Vitals:  Vitals:   02/23/17 1140 02/23/17 1150  BP: 91/62 96/68  Pulse: 61   Resp: 16   Temp: (!) 36 C   SpO2: 100%     Last Pain:  Vitals:   02/23/17 1140  TempSrc: Tympanic                 Stuart Jenkins

## 2017-02-24 ENCOUNTER — Encounter: Payer: Self-pay | Admitting: Gastroenterology

## 2017-02-24 LAB — SURGICAL PATHOLOGY

## 2017-03-04 ENCOUNTER — Other Ambulatory Visit: Payer: Self-pay | Admitting: Family Medicine

## 2017-03-04 DIAGNOSIS — I10 Essential (primary) hypertension: Secondary | ICD-10-CM

## 2017-03-27 ENCOUNTER — Other Ambulatory Visit: Payer: Self-pay

## 2017-03-29 ENCOUNTER — Other Ambulatory Visit: Payer: Self-pay | Admitting: Family Medicine

## 2017-03-29 DIAGNOSIS — E782 Mixed hyperlipidemia: Secondary | ICD-10-CM

## 2017-04-09 DIAGNOSIS — Z862 Personal history of diseases of the blood and blood-forming organs and certain disorders involving the immune mechanism: Secondary | ICD-10-CM | POA: Diagnosis not present

## 2017-06-08 DIAGNOSIS — M791 Myalgia, unspecified site: Secondary | ICD-10-CM | POA: Diagnosis not present

## 2017-06-08 DIAGNOSIS — Z951 Presence of aortocoronary bypass graft: Secondary | ICD-10-CM | POA: Diagnosis not present

## 2017-06-08 DIAGNOSIS — I10 Essential (primary) hypertension: Secondary | ICD-10-CM | POA: Diagnosis not present

## 2017-06-08 DIAGNOSIS — K219 Gastro-esophageal reflux disease without esophagitis: Secondary | ICD-10-CM | POA: Diagnosis not present

## 2017-06-08 DIAGNOSIS — I208 Other forms of angina pectoris: Secondary | ICD-10-CM | POA: Diagnosis not present

## 2017-06-08 DIAGNOSIS — J329 Chronic sinusitis, unspecified: Secondary | ICD-10-CM | POA: Diagnosis not present

## 2017-06-08 DIAGNOSIS — E785 Hyperlipidemia, unspecified: Secondary | ICD-10-CM | POA: Diagnosis not present

## 2017-06-08 DIAGNOSIS — M159 Polyosteoarthritis, unspecified: Secondary | ICD-10-CM | POA: Diagnosis not present

## 2017-06-08 DIAGNOSIS — R079 Chest pain, unspecified: Secondary | ICD-10-CM | POA: Diagnosis not present

## 2017-06-08 DIAGNOSIS — I2510 Atherosclerotic heart disease of native coronary artery without angina pectoris: Secondary | ICD-10-CM | POA: Diagnosis not present

## 2017-06-21 ENCOUNTER — Other Ambulatory Visit: Payer: Self-pay | Admitting: Family Medicine

## 2017-06-21 DIAGNOSIS — E782 Mixed hyperlipidemia: Secondary | ICD-10-CM

## 2017-06-21 DIAGNOSIS — I10 Essential (primary) hypertension: Secondary | ICD-10-CM

## 2017-07-17 ENCOUNTER — Other Ambulatory Visit: Payer: Self-pay | Admitting: Family Medicine

## 2017-07-17 DIAGNOSIS — I10 Essential (primary) hypertension: Secondary | ICD-10-CM

## 2017-07-27 ENCOUNTER — Other Ambulatory Visit: Payer: Self-pay | Admitting: Family Medicine

## 2017-07-27 DIAGNOSIS — E782 Mixed hyperlipidemia: Secondary | ICD-10-CM

## 2017-07-30 ENCOUNTER — Ambulatory Visit (INDEPENDENT_AMBULATORY_CARE_PROVIDER_SITE_OTHER): Payer: Medicare HMO | Admitting: Family Medicine

## 2017-07-30 ENCOUNTER — Encounter: Payer: Self-pay | Admitting: Family Medicine

## 2017-07-30 VITALS — BP 120/80 | HR 76 | Ht 71.0 in | Wt 171.0 lb

## 2017-07-30 DIAGNOSIS — E782 Mixed hyperlipidemia: Secondary | ICD-10-CM

## 2017-07-30 DIAGNOSIS — I25810 Atherosclerosis of coronary artery bypass graft(s) without angina pectoris: Secondary | ICD-10-CM | POA: Insufficient documentation

## 2017-07-30 DIAGNOSIS — N401 Enlarged prostate with lower urinary tract symptoms: Secondary | ICD-10-CM

## 2017-07-30 DIAGNOSIS — I10 Essential (primary) hypertension: Secondary | ICD-10-CM | POA: Diagnosis not present

## 2017-07-30 MED ORDER — ATORVASTATIN CALCIUM 80 MG PO TABS: 80.0000 mg | ORAL_TABLET | Freq: Every day | ORAL | 1 refills | Status: DC

## 2017-07-30 MED ORDER — TAMSULOSIN HCL 0.4 MG PO CAPS: 0.4000 mg | ORAL_CAPSULE | Freq: Every day | ORAL | 1 refills | Status: DC

## 2017-07-30 MED ORDER — METOPROLOL SUCCINATE ER 25 MG PO TB24: 25.0000 mg | ORAL_TABLET | Freq: Every day | ORAL | 1 refills | Status: DC

## 2017-07-30 MED ORDER — AMLODIPINE BESYLATE 5 MG PO TABS: 5.0000 mg | ORAL_TABLET | Freq: Every day | ORAL | 1 refills | Status: DC

## 2017-07-30 MED ORDER — ASPIRIN EC 81 MG PO TBEC: 81.0000 mg | DELAYED_RELEASE_TABLET | Freq: Every day | ORAL | 3 refills | Status: AC

## 2017-07-30 NOTE — Progress Notes (Signed)
Name: Stuart Jenkins   MRN: [id number]    DOB: February 24, 1941   Date:07/30/2017       Progress Note  Subjective  Chief Complaint  Chief Complaint  Patient presents with  . Hyperlipidemia  . Hypertension  . Benign Prostatic Hypertrophy    Hyperlipidemia  This is a chronic problem. The current episode started more than 1 year ago. The problem is controlled. Recent lipid tests were reviewed and are normal. He has no history of chronic renal disease, diabetes, hypothyroidism, liver disease, obesity or nephrotic syndrome. There are no known factors aggravating his hyperlipidemia. Pertinent negatives include no chest pain, focal sensory loss, focal weakness, leg pain, myalgias or shortness of breath. Current antihyperlipidemic treatment includes statins. The current treatment provides moderate improvement of lipids. There are no compliance problems.  Risk factors for coronary artery disease include dyslipidemia and male sex.  Hypertension  This is a chronic problem. The current episode started more than 1 year ago. The problem is unchanged. The problem is controlled. Pertinent negatives include no anxiety, blurred vision, chest pain, headaches, malaise/fatigue, neck pain, orthopnea, palpitations, peripheral edema, PND, shortness of breath or sweats. There are no associated agents to hypertension. There is no history of chronic renal disease.  Benign Prostatic Hypertrophy  This is a chronic problem. The current episode started more than 1 year ago. The problem has been waxing and waning since onset. Irritative symptoms include nocturia. Irritative symptoms do not include frequency or urgency. better. Obstructive symptoms do not include dribbling, incomplete emptying, an intermittent stream, a slower stream, straining or a weak stream. Pertinent negatives include no chills, dysuria, hematuria, hesitancy, nausea or vomiting. Past treatments include tamsulosin. The treatment provided moderate relief.  Heart  Problem  This is a chronic problem. The problem occurs intermittently. The problem has been waxing and waning. Pertinent negatives include no abdominal pain, anorexia, arthralgias, change in bowel habit, chest pain, chills, congestion, coughing, diaphoresis, fatigue, fever, headaches, joint swelling, myalgias, nausea, neck pain, numbness, rash, sore throat, swollen glands, urinary symptoms, vertigo, visual change, vomiting or weakness.    No problem-specific Assessment & Plan notes found for this encounter.   Past Medical History:  Diagnosis Date  . Atrial fibrillation ([place])   . BPH (benign prostatic hypertrophy)   . Colon polyp   . Coronary artery disease   . Gout   . Hyperlipidemia   . Hypertension   . Peptic ulcer disease     Past Surgical History:  Procedure Laterality Date  . APPENDECTOMY    . CARDIAC CATHETERIZATION N/A 11/08/2014   Procedure: Left Heart Cath and Coronary Angiography;  Surgeon: Yolonda Kida, MD;  Location: [facility];  Service: Cardiovascular;  Laterality: N/A;  . COLONOSCOPY WITH PROPOFOL N/A 02/23/2017   Procedure: COLONOSCOPY WITH PROPOFOL;  Surgeon: Lollie Sails, MD;  Location: [facility] END[facility]COPY;  Service: Endoscopy;  Laterality: N/A;  . CORONARY ANGIOPLASTY    . ELECTROPHYSIOLOGIC STUDY N/A 01/30/2015   Procedure: Cardioversion;  Surgeon: Yolonda Kida, MD;  Location: [facility] ORS;  Service: Cardiovascular;  Laterality: N/A;  . FRACTURE SURGERY Left    arm, knee, collar bone  . FRACTURE SURGERY    . GASTRIC RESECTION     partial  . open heart surgery  12/05/2014    Family History  Problem Relation Age of Onset  . Hypertension Brother     Social History   Socioeconomic History  . Marital status: Married    Spouse name: Not on file  .  Number of children: Not on file  . Years of education: Not on file  . Highest education level: Not on file  Occupational History  . Not on file  Social Needs  . Financial resource strain: Not  on file  . Food insecurity:    Worry: Not on file    Inability: Not on file  . Transportation needs:    Medical: Not on file    Non-medical: Not on file  Tobacco Use  . Smoking status: Former Smoker    Last attempt to quit: 11/07/1984    Years since quitting: 32.7  . Smokeless tobacco: Never Used  Substance and Sexual Activity  . Alcohol use: Yes    Comment: less than 1 drink/week  . Drug use: No  . Sexual activity: Yes  Lifestyle  . Physical activity:    Days per week: Not on file    Minutes per session: Not on file  . Stress: Not on file  Relationships  . Social connections:    Talks on phone: Not on file    Gets together: Not on file    Attends religious service: Not on file    Active member of club or organization: Not on file    Attends meetings of clubs or organizations: Not on file    Relationship status: Not on file  . Intimate partner violence:    Fear of current or ex partner: Not on file    Emotionally abused: Not on file    Physically abused: Not on file    Forced sexual activity: Not on file  Other Topics Concern  . Not on file  Social History Narrative  . Not on file    Allergies  Allergen Reactions  . Oxycodone Hcl Other (See Comments)    Altered mental status  . Sulfa Antibiotics Rash    Outpatient Medications Prior to Visit  Medication Sig Dispense Refill  . amLODipine (NORVASC) 5 MG tablet TAKE 1 TABLET BY MOUTH EVERY DAY 30 tablet 0  . aspirin EC 81 MG tablet Take 81 mg by mouth daily.    Marland Kitchen atorvastatin (LIPITOR) 80 MG tablet TAKE 1 TABLET BY MOUTH EVERY DAY 30 tablet 0  . metoprolol succinate (TOPROL-XL) 25 MG 24 hr tablet Take 1 tablet (25 mg total) by mouth at bedtime. 90 tablet 1  . tamsulosin (FLOMAX) 0.4 MG CAPS capsule Take 0.4 mg by mouth 2 (two) times daily.     . polyethylene glycol powder (GLYCOLAX/MIRALAX) powder Take 1 Container by mouth once.     No facility-administered medications prior to visit.     Review of Systems   Constitutional: Negative for chills, diaphoresis, fatigue, fever, malaise/fatigue and weight loss.  HENT: Negative for congestion, ear discharge, ear pain and sore throat.   Eyes: Negative for blurred vision.  Respiratory: Negative for cough, sputum production, shortness of breath and wheezing.   Cardiovascular: Negative for chest pain, palpitations, orthopnea, leg swelling and PND.  Gastrointestinal: Negative for abdominal pain, anorexia, blood in stool, change in bowel habit, constipation, diarrhea, heartburn, melena, nausea and vomiting.  Genitourinary: Positive for nocturia. Negative for dysuria, frequency, hematuria, hesitancy, incomplete emptying and urgency.  Musculoskeletal: Negative for arthralgias, back pain, joint pain, joint swelling, myalgias and neck pain.  Skin: Negative for rash.  Neurological: Negative for dizziness, vertigo, tingling, sensory change, focal weakness, weakness, numbness and headaches.  Endo/Heme/Allergies: Negative for environmental allergies and polydipsia. Does not bruise/bleed easily.  Psychiatric/Behavioral: Negative for depression and suicidal ideas. The patient  is not nervous/anxious and does not have insomnia.      Objective  Vitals:   07/30/17 0859  BP: 120/80  Pulse: 76  Weight: 171 lb (77.6 kg)  Height: 5\' 11"  (1.803 m)    Physical Exam  Constitutional: He is oriented to person, place, and time.  HENT:  Head: Normocephalic.  Right Ear: Tympanic membrane and external ear normal.  Left Ear: Tympanic membrane and external ear normal.  Nose: Nose normal.  Mouth/Throat: Oropharynx is clear and moist.  Eyes: Pupils are equal, round, and reactive to light. Conjunctivae and EOM are normal. Right eye exhibits no discharge. Left eye exhibits no discharge. No scleral icterus.  Neck: Normal range of motion. Neck supple. No JVD present. No tracheal deviation present. No thyromegaly present.  Cardiovascular: Normal rate, regular rhythm, S1 normal, S2  normal, normal heart sounds and intact distal pulses. Exam reveals no gallop, no distant heart sounds and no friction rub.  No murmur heard. Pulmonary/Chest: Breath sounds normal. No respiratory distress. He has no wheezes. He has no rales.  Abdominal: Soft. Bowel sounds are normal. He exhibits no mass. There is no hepatosplenomegaly. There is no tenderness. There is no rebound, no guarding and no CVA tenderness.  Musculoskeletal: Normal range of motion. He exhibits no edema or tenderness.  Lymphadenopathy:    He has no cervical adenopathy.  Neurological: He is alert and oriented to person, place, and time. He has normal strength and normal reflexes. No cranial nerve deficit.  Skin: Skin is warm. No rash noted.  Nursing note and vitals reviewed.     Assessment & Plan  Problem List Items Addressed This Visit      Cardiovascular and Mediastinum   Essential hypertension - Primary   Relevant Medications   amLODipine (NORVASC) 5 MG tablet   metoprolol succinate (TOPROL-XL) 25 MG 24 hr tablet   atorvastatin (LIPITOR) 80 MG tablet   aspirin EC 81 MG tablet   Other Relevant Orders   Renal Function Panel   Coronary artery disease involving coronary bypass graft of native heart without angina pectoris   Relevant Medications   amLODipine (NORVASC) 5 MG tablet   metoprolol succinate (TOPROL-XL) 25 MG 24 hr tablet   atorvastatin (LIPITOR) 80 MG tablet   aspirin EC 81 MG tablet     Genitourinary   Benign prostatic hyperplasia with lower urinary tract symptoms   Relevant Medications   tamsulosin (FLOMAX) 0.4 MG CAPS capsule     Other   Mixed hyperlipidemia   Relevant Medications   amLODipine (NORVASC) 5 MG tablet   metoprolol succinate (TOPROL-XL) 25 MG 24 hr tablet   atorvastatin (LIPITOR) 80 MG tablet   aspirin EC 81 MG tablet   Other Relevant Orders   Lipid panel      Meds ordered this encounter  Medications  . amLODipine (NORVASC) 5 MG tablet    Sig: Take 1 tablet (5 mg  total) by mouth daily.    Dispense:  90 tablet    Refill:  1  . metoprolol succinate (TOPROL-XL) 25 MG 24 hr tablet    Sig: Take 1 tablet (25 mg total) by mouth at bedtime for 180 doses.    Dispense:  90 tablet    Refill:  1  . atorvastatin (LIPITOR) 80 MG tablet    Sig: Take 1 tablet (80 mg total) by mouth daily.    Dispense:  90 tablet    Refill:  1    Last time filling  . tamsulosin (  FLOMAX) 0.4 MG CAPS capsule    Sig: Take 1 capsule (0.4 mg total) by mouth daily.    Dispense:  90 capsule    Refill:  1  . aspirin EC 81 MG tablet    Sig: Take 1 tablet (81 mg total) by mouth daily.    Dispense:  100 tablet    Refill:  3      Dr. Otilio Miu [facility]  07/30/17

## 2017-07-31 LAB — RENAL FUNCTION PANEL
Albumin: 4.4 g/dL (ref 3.5–4.8)
BUN/Creatinine Ratio: 13 (ref 10–24)
BUN: 14 mg/dL (ref 8–27)
CHLORIDE: 103 mmol/L (ref 96–106)
CO2: 22 mmol/L (ref 20–29)
CREATININE: 1.1 mg/dL (ref 0.76–1.27)
Calcium: 9.4 mg/dL (ref 8.6–10.2)
GFR calc Af Amer: 75 mL/min/{1.73_m2} (ref >59)
GFR calc non Af Amer: 65 mL/min/{1.73_m2} (ref >59)
Glucose: 101 mg/dL — ABNORMAL HIGH (ref 65–99)
Phosphorus: 3.1 mg/dL (ref 2.5–4.5)
Potassium: 4.7 mmol/L (ref 3.5–5.2)
Sodium: 140 mmol/L (ref 134–144)

## 2017-07-31 LAB — LIPID PANEL
CHOLESTEROL TOTAL: 82 mg/dL — AB (ref 100–199)
Chol/HDL Ratio: 2.5 ratio (ref 0.0–5.0)
HDL: 33 mg/dL — ABNORMAL LOW (ref >39)
LDL CALC: 32 mg/dL (ref 0–99)
TRIGLYCERIDES: 85 mg/dL (ref 0–149)
VLDL Cholesterol Cal: 17 mg/dL (ref 5–40)

## 2017-08-03 DIAGNOSIS — L821 Other seborrheic keratosis: Secondary | ICD-10-CM | POA: Diagnosis not present

## 2017-08-20 IMAGING — CR DG CHEST 1V
1 series · 1 of 1 positions shown · non-contrast
Comparison: March 22, 2010

CLINICAL DATA: Dizziness and syncope

EXAM:
CHEST 1 VIEW

[ap]
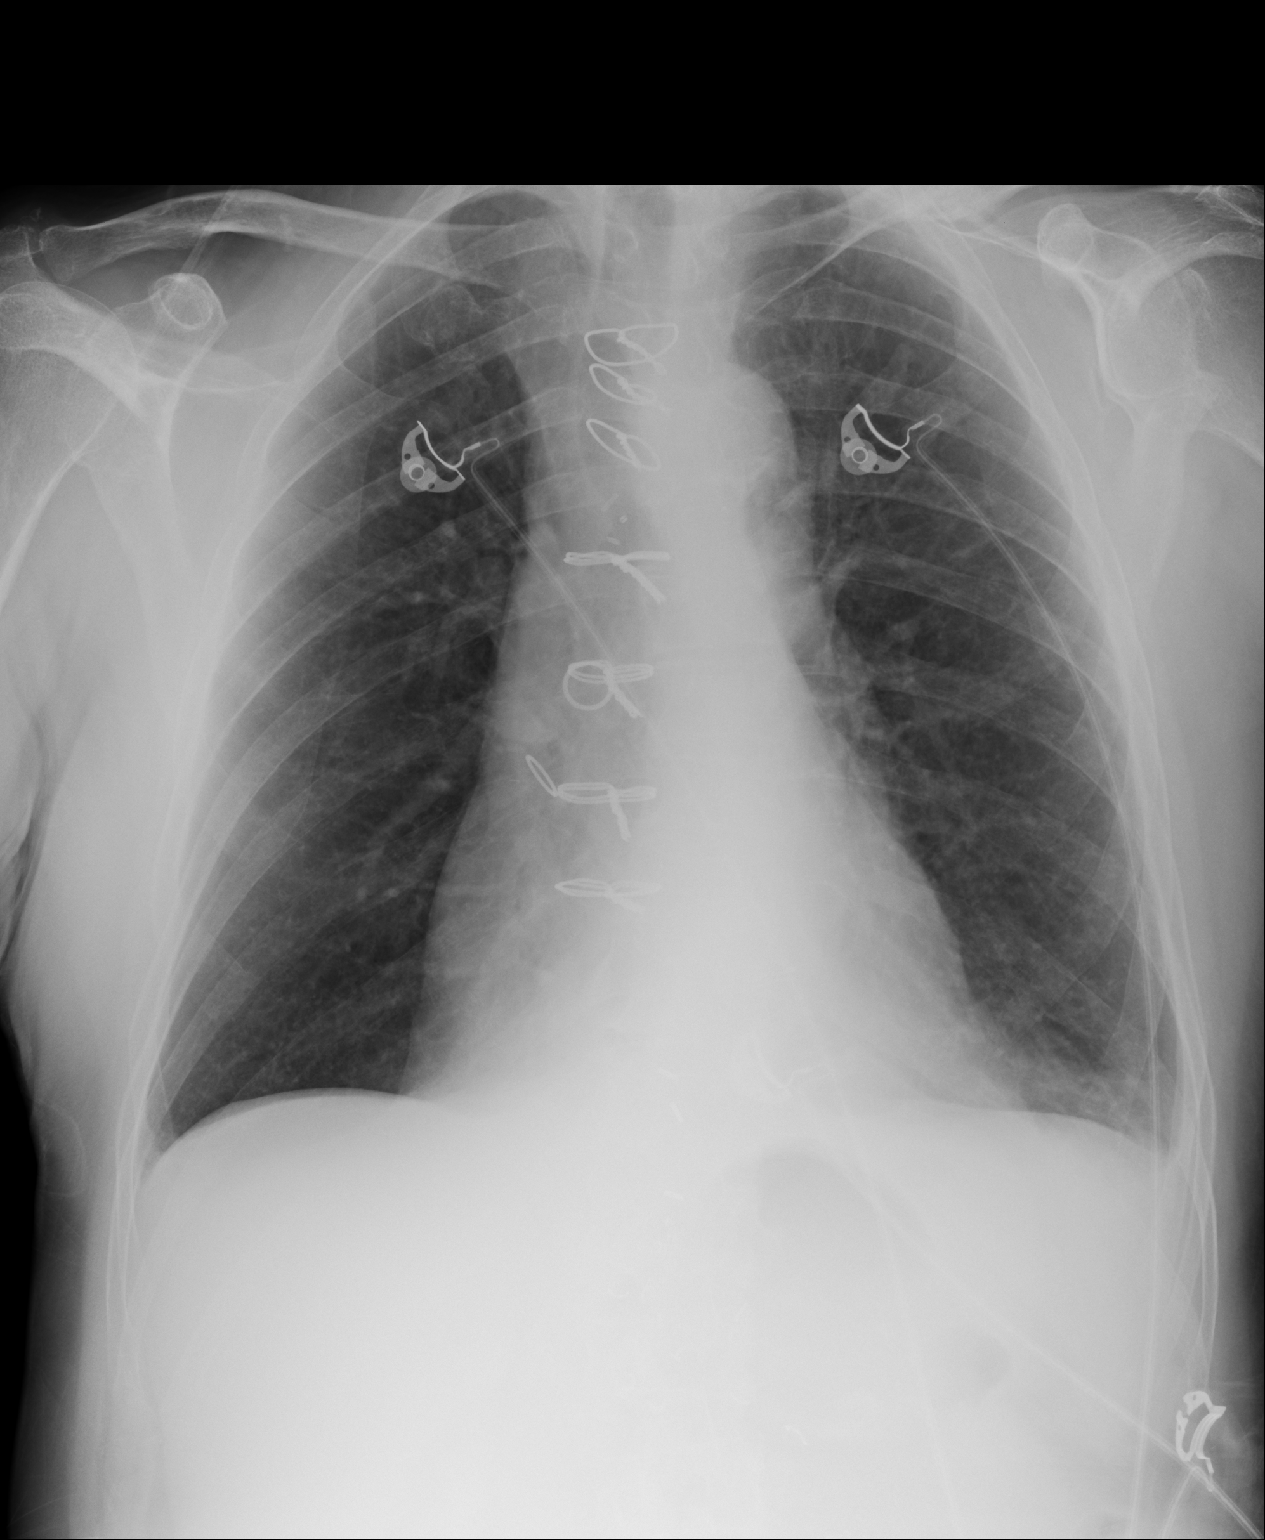

[1 of 1 positions shown; findings below may reference images not displayed]

FINDINGS: There is a small area of infiltrate in the left base. Lungs
elsewhere clear. Heart size and pulmonary vascularity are within
normal limits. Patient is status post coronary artery bypass
grafting. No adenopathy. There is an old healed fracture of the left
clavicle. There are surgical clips at the gastroesophageal junction
region.
IMPRESSION: Small area of infiltrate left base. Lungs otherwise clear. Followup
PA and lateral chest radiographs recommended in 3-4 weeks following
trial of antibiotic therapy to ensure resolution and exclude
underlying malignancy.

## 2017-08-21 IMAGING — CR DG CHEST 2V
1 series · 2 of 2 positions shown · non-contrast
Comparison: 12/23/2014 and 03/22/2010 chest radiographs

CLINICAL DATA: Acute chest pain and near syncope. CABG 3 weeks ago.

EXAM:
CHEST  2 VIEW

[Series 1: dg chest 2 view · 0.14mm/px · 2 of 2 slices shown]
[im 1/2]
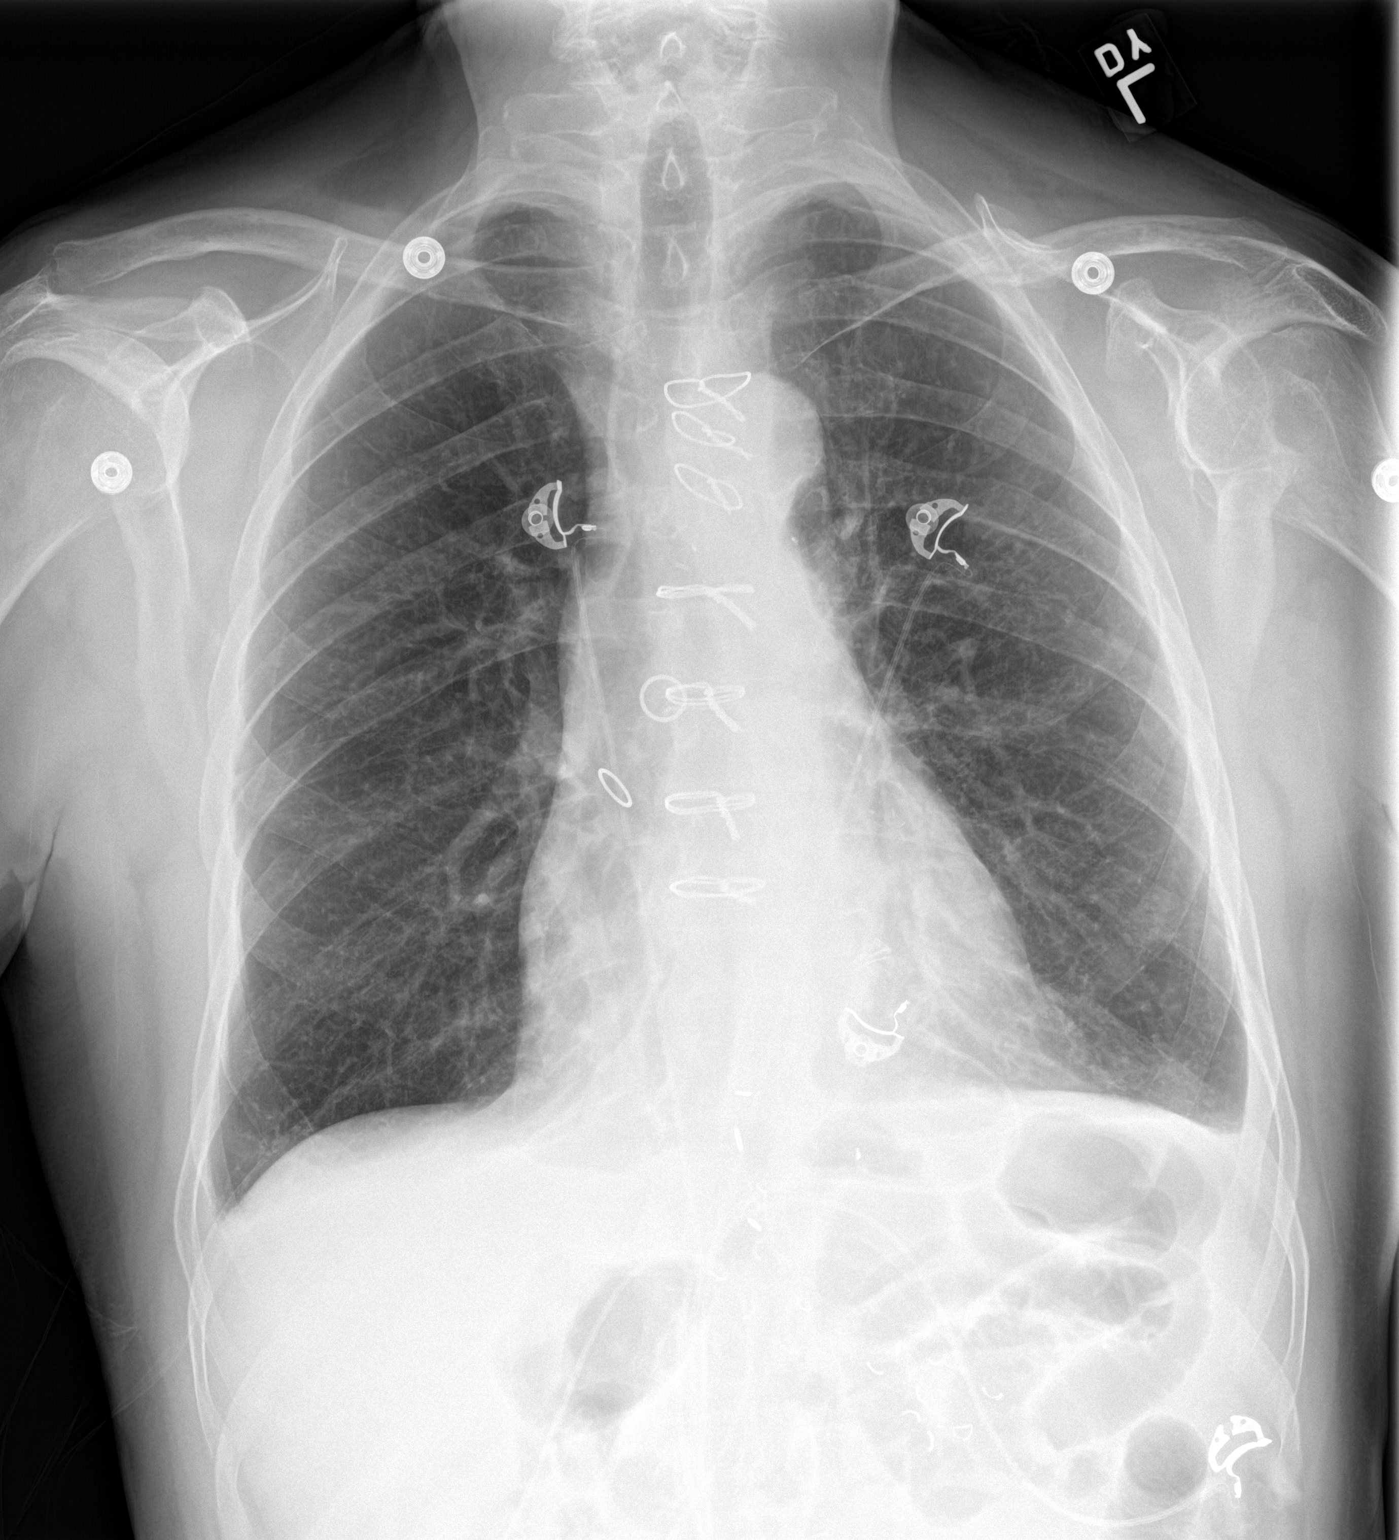
[im 2/2]
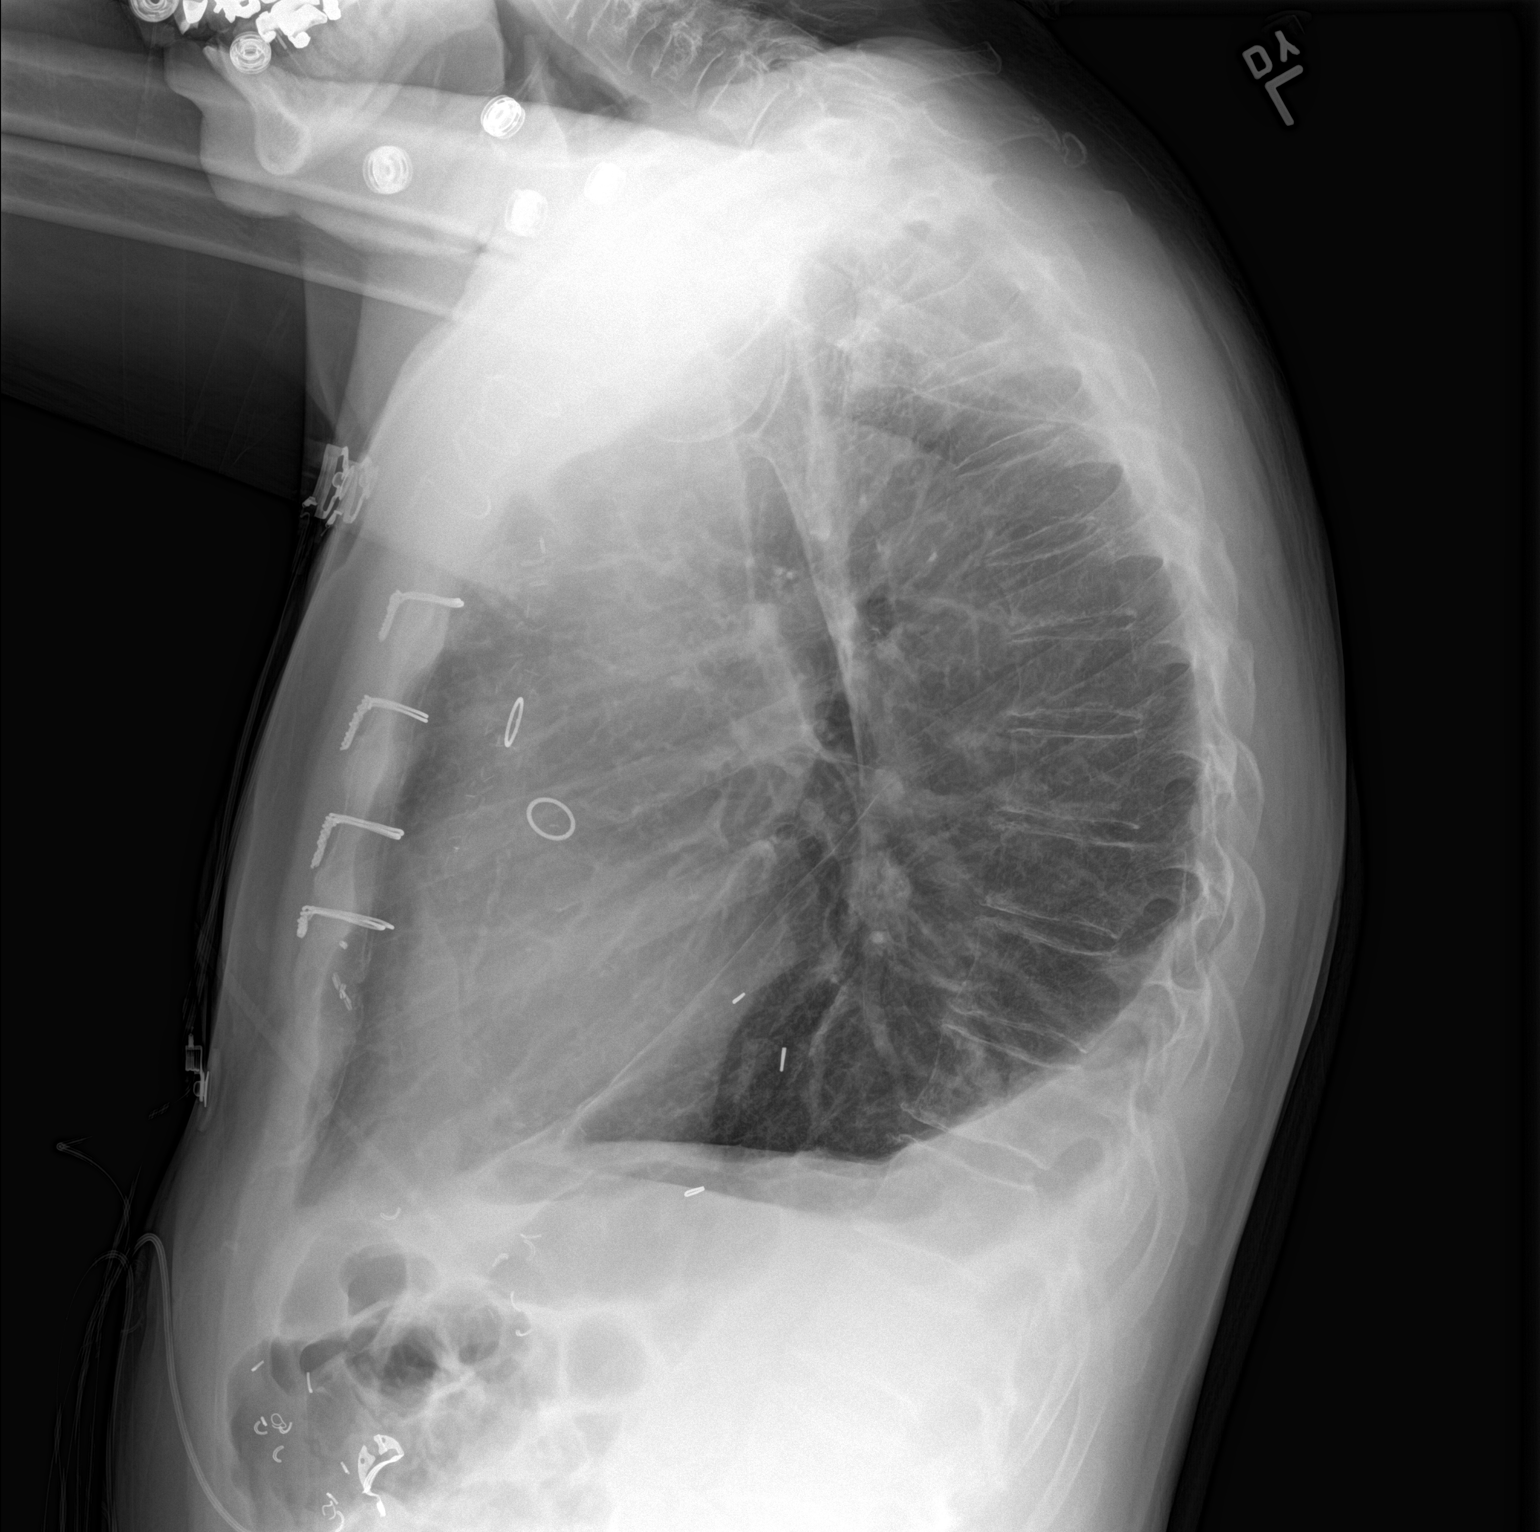

[2 of 2 positions shown; findings below may reference images not displayed]

FINDINGS: Cardiomegaly and CABG changes again noted.

Small bilateral pleural effusions, left-greater-than-right, are
noted.

There is no evidence of focal airspace disease, pulmonary edema,
suspicious pulmonary nodule/mass or pneumothorax. No acute bony
abnormalities are identified.
IMPRESSION: Cardiomegaly, CABG changes and small bilateral pleural effusions,
left-greater-than-right.

## 2017-09-03 ENCOUNTER — Encounter: Payer: Self-pay | Admitting: Family Medicine

## 2017-09-03 ENCOUNTER — Ambulatory Visit (INDEPENDENT_AMBULATORY_CARE_PROVIDER_SITE_OTHER): Payer: Medicare HMO | Admitting: Family Medicine

## 2017-09-03 VITALS — BP 130/80 | HR 72 | Temp 98.0°F | Ht 71.0 in | Wt 165.0 lb

## 2017-09-03 DIAGNOSIS — J0100 Acute maxillary sinusitis, unspecified: Secondary | ICD-10-CM

## 2017-09-03 DIAGNOSIS — R059 Cough, unspecified: Secondary | ICD-10-CM

## 2017-09-03 DIAGNOSIS — R05 Cough: Secondary | ICD-10-CM | POA: Diagnosis not present

## 2017-09-03 MED ORDER — AMOXICILLIN 500 MG PO CAPS: 500.0000 mg | ORAL_CAPSULE | Freq: Three times a day (TID) | ORAL | 0 refills | Status: DC

## 2017-09-03 MED ORDER — GUAIFENESIN-CODEINE 100-10 MG/5ML PO SYRP: 5.0000 mL | ORAL_SOLUTION | Freq: Three times a day (TID) | ORAL | 0 refills | Status: DC | PRN

## 2017-09-03 NOTE — Progress Notes (Signed)
Name: Stuart Jenkins   MRN: [id number]    DOB: 10-08-1940   Date:09/03/2017       Progress Note  Subjective  Chief Complaint  Chief Complaint  Patient presents with  . Sinusitis    cough, head stopped up, chills    Sinusitis  This is a new problem. The current episode started in the past 7 days. The problem is unchanged. There has been no fever. Associated symptoms include chills, congestion, coughing, headaches, a hoarse voice, sinus pressure and a sore throat. Pertinent negatives include no diaphoresis, ear pain, neck pain, shortness of breath, sneezing or swollen glands. Past treatments include nothing.    No problem-specific Assessment & Plan notes found for this encounter.   Past Medical History:  Diagnosis Date  . Atrial fibrillation ([place])   . BPH (benign prostatic hypertrophy)   . Colon polyp   . Coronary artery disease   . Gout   . Hyperlipidemia   . Hypertension   . Peptic ulcer disease     Past Surgical History:  Procedure Laterality Date  . APPENDECTOMY    . CARDIAC CATHETERIZATION N/A 11/08/2014   Procedure: Left Heart Cath and Coronary Angiography;  Surgeon: Yolonda Kida, MD;  Location: [place] [facility];  Service: Cardiovascular;  Laterality: N/A;  . COLONOSCOPY WITH PROPOFOL N/A 02/23/2017   Procedure: COLONOSCOPY WITH PROPOFOL;  Surgeon: Lollie Sails, MD;  Location: [facility] ENDOSCOPY;  Service: Endoscopy;  Laterality: N/A;  . CORONARY ANGIOPLASTY    . ELECTROPHYSIOLOGIC STUDY N/A 01/30/2015   Procedure: Cardioversion;  Surgeon: Yolonda Kida, MD;  Location: [facility] ORS;  Service: Cardiovascular;  Laterality: N/A;  . FRACTURE SURGERY Left    arm, knee, collar bone  . FRACTURE SURGERY    . GASTRIC RESECTION     partial  . open heart surgery  12/05/2014    Family History  Problem Relation Age of Onset  . Hypertension Brother     Social History   Socioeconomic History  . Marital status: Married    Spouse name: Not on file  . Number  of children: Not on file  . Years of education: Not on file  . Highest education level: Not on file  Occupational History  . Not on file  Social Needs  . Financial resource strain: Not on file  . Food insecurity:    Worry: Not on file    Inability: Not on file  . Transportation needs:    Medical: Not on file    Non-medical: Not on file  Tobacco Use  . Smoking status: Former Smoker    Last attempt to quit: 11/07/1984    Years since quitting: 32.8  . Smokeless tobacco: Never Used  Substance and Sexual Activity  . Alcohol use: Yes    Comment: less than 1 drink/week  . Drug use: No  . Sexual activity: Yes  Lifestyle  . Physical activity:    Days per week: Not on file    Minutes per session: Not on file  . Stress: Not on file  Relationships  . Social connections:    Talks on phone: Not on file    Gets together: Not on file    Attends religious service: Not on file    Active member of club or organization: Not on file    Attends meetings of clubs or organizations: Not on file    Relationship status: Not on file  . Intimate partner violence:    Fear of current or ex  partner: Not on file    Emotionally abused: Not on file    Physically abused: Not on file    Forced sexual activity: Not on file  Other Topics Concern  . Not on file  Social History Narrative  . Not on file    Allergies  Allergen Reactions  . Oxycodone Hcl Other (See Comments)    Altered mental status  . Sulfa Antibiotics Rash    Outpatient Medications Prior to Visit  Medication Sig Dispense Refill  . amLODipine (NORVASC) 5 MG tablet Take 1 tablet (5 mg total) by mouth daily. 90 tablet 1  . aspirin EC 81 MG tablet Take 1 tablet (81 mg total) by mouth daily. 100 tablet 3  . atorvastatin (LIPITOR) 80 MG tablet Take 1 tablet (80 mg total) by mouth daily. 90 tablet 1  . metoprolol succinate (TOPROL-XL) 25 MG 24 hr tablet Take 1 tablet (25 mg total) by mouth at bedtime for 180 doses. 90 tablet 1  .  tamsulosin (FLOMAX) 0.4 MG CAPS capsule Take 1 capsule (0.4 mg total) by mouth daily. 90 capsule 1   No facility-administered medications prior to visit.     Review of Systems  Constitutional: Positive for chills. Negative for diaphoresis, fever, malaise/fatigue and weight loss.  HENT: Positive for congestion, hoarse voice, sinus pressure and sore throat. Negative for ear discharge, ear pain and sneezing.   Eyes: Negative for blurred vision.  Respiratory: Positive for cough. Negative for sputum production, shortness of breath and wheezing.   Cardiovascular: Negative for chest pain, palpitations and leg swelling.  Gastrointestinal: Negative for abdominal pain, blood in stool, constipation, diarrhea, heartburn, melena and nausea.  Genitourinary: Negative for dysuria, frequency, hematuria and urgency.  Musculoskeletal: Negative for back pain, joint pain, myalgias and neck pain.  Skin: Negative for rash.  Neurological: Positive for headaches. Negative for dizziness, tingling, sensory change and focal weakness.  Endo/Heme/Allergies: Negative for environmental allergies and polydipsia. Does not bruise/bleed easily.  Psychiatric/Behavioral: Negative for depression and suicidal ideas. The patient is not nervous/anxious and does not have insomnia.      Objective  Vitals:   09/03/17 1105  BP: 130/80  Pulse: 72  Temp: 98 F (36.7 C)  TempSrc: Oral  Weight: 165 lb (74.8 kg)  Height: 5\' 11"  (1.803 m)    Physical Exam  Constitutional: He is oriented to person, place, and time.  HENT:  Head: Normocephalic.  Right Ear: Tympanic membrane and external ear normal.  Left Ear: Tympanic membrane and external ear normal.  Nose: Mucosal edema present. Right sinus exhibits maxillary sinus tenderness and frontal sinus tenderness. Left sinus exhibits maxillary sinus tenderness and frontal sinus tenderness.  Mouth/Throat: Uvula is midline, oropharynx is clear and moist and mucous membranes are normal.  No oropharyngeal exudate, posterior oropharyngeal edema or posterior oropharyngeal erythema.  Eyes: Pupils are equal, round, and reactive to light. Conjunctivae and EOM are normal. Right eye exhibits no discharge. Left eye exhibits no discharge. No scleral icterus.  Neck: Normal range of motion. Neck supple. No JVD present. No tracheal deviation present. No thyromegaly present.  Cardiovascular: Normal rate, regular rhythm, normal heart sounds and intact distal pulses. Exam reveals no gallop and no friction rub.  No murmur heard. Pulmonary/Chest: Breath sounds normal. No respiratory distress. He has no wheezes. He has no rales.  Abdominal: Soft. Bowel sounds are normal. He exhibits no mass. There is no hepatosplenomegaly. There is no tenderness. There is no rebound, no guarding and no CVA tenderness.  Musculoskeletal: Normal  range of motion. He exhibits no edema or tenderness.  Lymphadenopathy:    He has no cervical adenopathy.  Neurological: He is alert and oriented to person, place, and time. He has normal strength and normal reflexes. No cranial nerve deficit.  Skin: Skin is warm. No rash noted.  Nursing note and vitals reviewed.     Assessment & Plan  Problem List Items Addressed This Visit    None    Visit Diagnoses    Acute maxillary sinusitis, recurrence not specified    -  Primary   Onset of maxillary pain with drainage for 1 week. Will treat with Amoxil 500mg  tid for i week.   Relevant Medications   amoxicillin (AMOXIL) 500 MG capsule   guaiFENesin-codeine (ROBITUSSIN AC) 100-10 MG/5ML syrup   Cough       Recurrent cough affecting sleep, will provide robitussin ac for relief.   Relevant Medications   guaiFENesin-codeine (ROBITUSSIN AC) 100-10 MG/5ML syrup      Meds ordered this encounter  Medications  . amoxicillin (AMOXIL) 500 MG capsule    Sig: Take 1 capsule (500 mg total) by mouth 3 (three) times daily.    Dispense:  30 capsule    Refill:  0  . guaiFENesin-codeine  (ROBITUSSIN AC) 100-10 MG/5ML syrup    Sig: Take 5 mLs by mouth 3 (three) times daily as needed for cough.    Dispense:  150 mL    Refill:  0      Dr. Otilio Miu [facility]  09/03/17

## 2017-12-10 DIAGNOSIS — I10 Essential (primary) hypertension: Secondary | ICD-10-CM | POA: Diagnosis not present

## 2017-12-10 DIAGNOSIS — E785 Hyperlipidemia, unspecified: Secondary | ICD-10-CM | POA: Diagnosis not present

## 2017-12-10 DIAGNOSIS — K219 Gastro-esophageal reflux disease without esophagitis: Secondary | ICD-10-CM | POA: Diagnosis not present

## 2017-12-10 DIAGNOSIS — I208 Other forms of angina pectoris: Secondary | ICD-10-CM | POA: Diagnosis not present

## 2017-12-10 DIAGNOSIS — M159 Polyosteoarthritis, unspecified: Secondary | ICD-10-CM | POA: Diagnosis not present

## 2017-12-10 DIAGNOSIS — Z951 Presence of aortocoronary bypass graft: Secondary | ICD-10-CM | POA: Diagnosis not present

## 2017-12-10 DIAGNOSIS — I2510 Atherosclerotic heart disease of native coronary artery without angina pectoris: Secondary | ICD-10-CM | POA: Diagnosis not present

## 2017-12-21 IMAGING — MR MR CERVICAL SPINE W/O CM
5 series · 35 of 48 positions shown · non-contrast
Comparison: 02/28/2015

CLINICAL DATA: Neck pain radiating in to the posterior head.
Cervical disc disorder.

EXAM:
MRI CERVICAL SPINE WITHOUT CONTRAST
TECHNIQUE: Multiplanar, multisequence MR imaging of the cervical spine was
performed. No intravenous contrast was administered.

[Series 3: T2 · sagittal · 3.0mm · 0.70mm/px · 8 of 15 slices shown (1 of 2)]
[im 1/15]
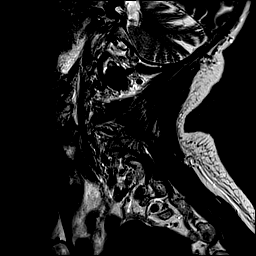
[im 3/15]
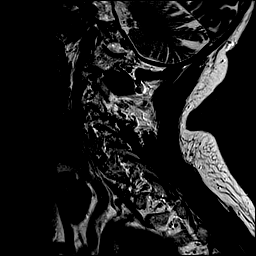
[im 5/15]
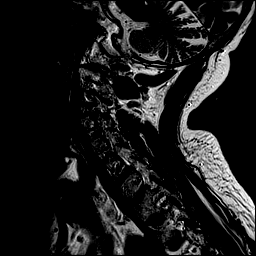
[im 7/15]
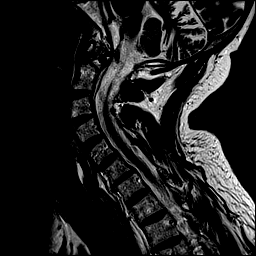
[im 9/15]
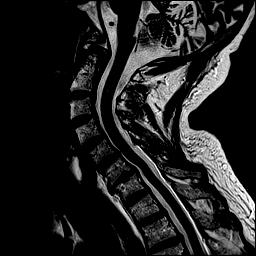
[im 11/15]
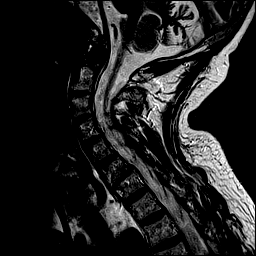
[im 13/15]
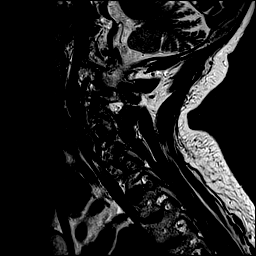
[im 15/15]
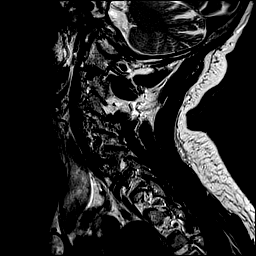

[Series 4: T1 · sagittal · 3.0mm · 0.70mm/px · 7 of 15 slices shown]
[im 1/15]
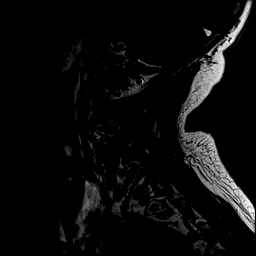
[im 3/15]
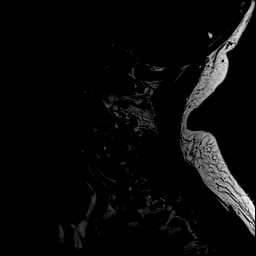
[im 5/15]
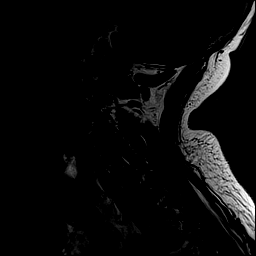
[im 8/15]
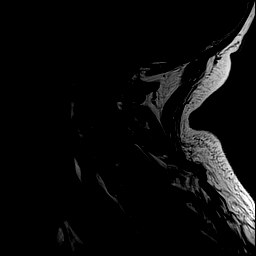
[im 10/15]
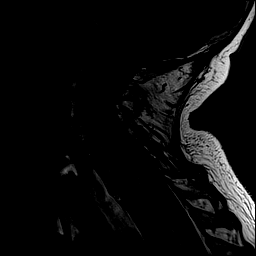
[im 12/15]
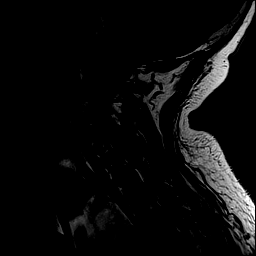
[im 15/15]
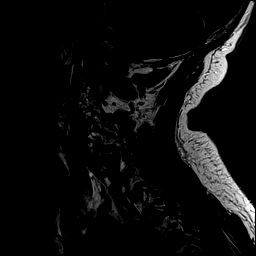

[Series 5: STIR · sagittal · 3.0mm · 0.35mm/px · 7 of 15 slices shown]
[im 1/15]
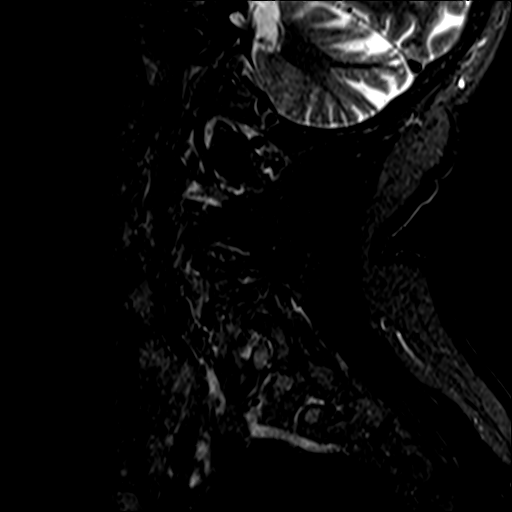
[im 3/15]
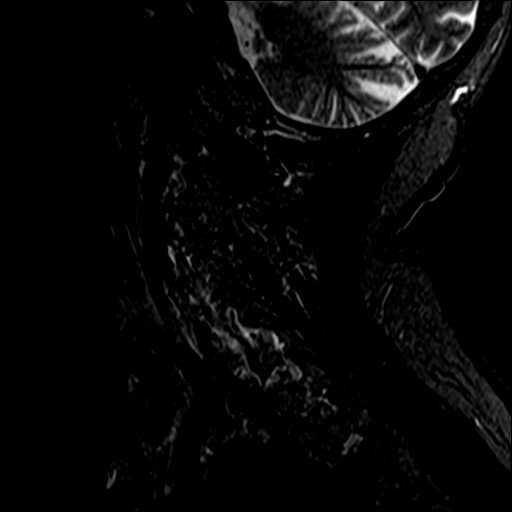
[im 5/15]
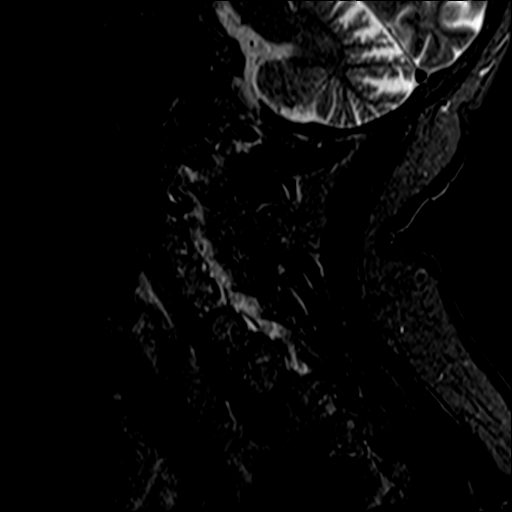
[im 8/15]
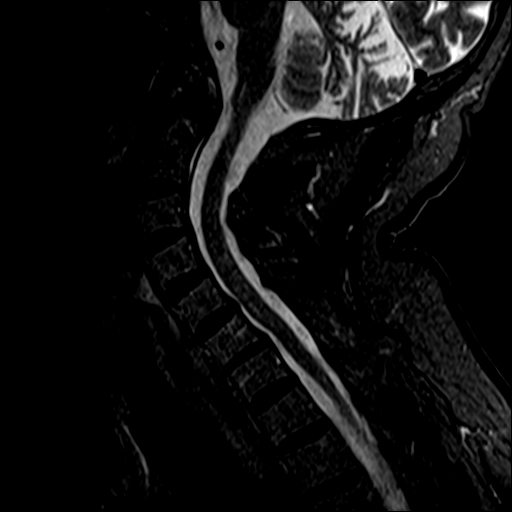
[im 10/15]
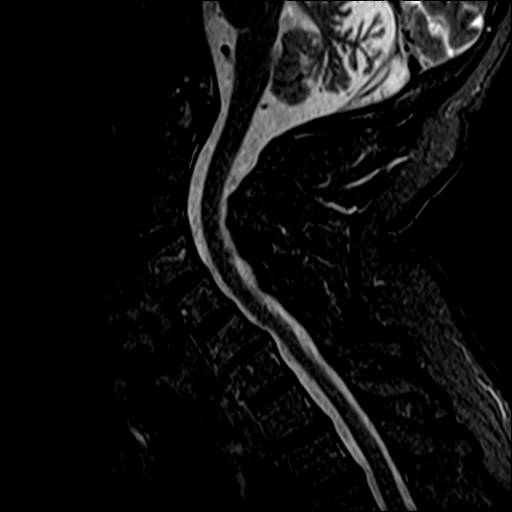
[im 12/15]
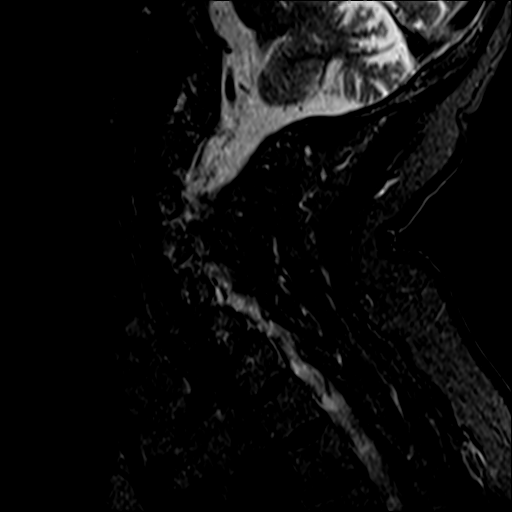
[im 15/15]
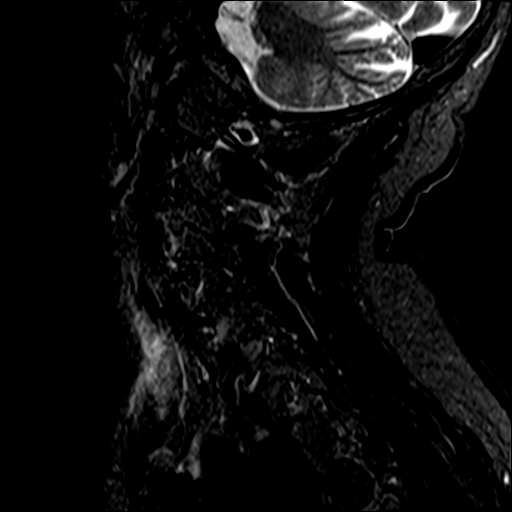

[Series 6: T2 · axial · 3.0mm · 0.70mm/px · z∈[-80,+16]mm · 9 of 27 slices shown (2 of 2)]
[im 1/27]
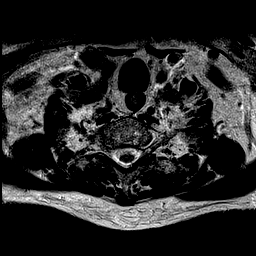
[im 5/27]
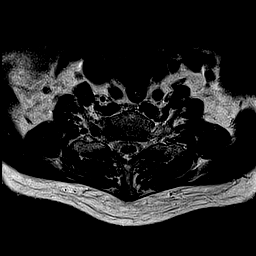
[im 9/27]
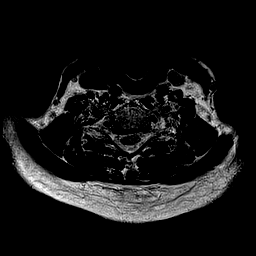
[im 11/27]
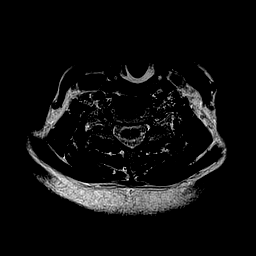
[im 14/27]
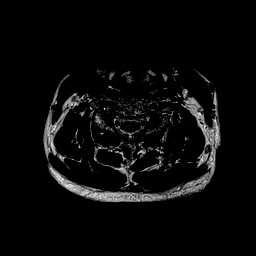
[im 16/27]
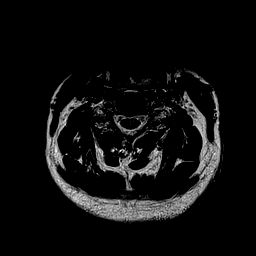
[im 18/27]
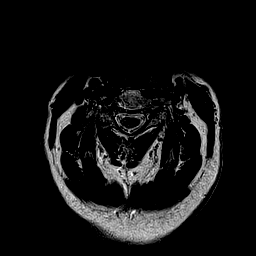
[im 22/27]
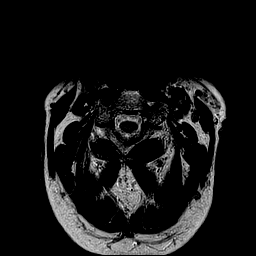
[im 27/27]
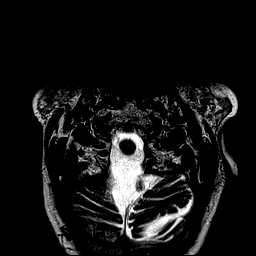

[Series 7: mpgr ax · axial · 3.0mm · 0.35mm/px · z∈[-80,-43]mm · 4 of 27 slices shown]
[im 1/27]
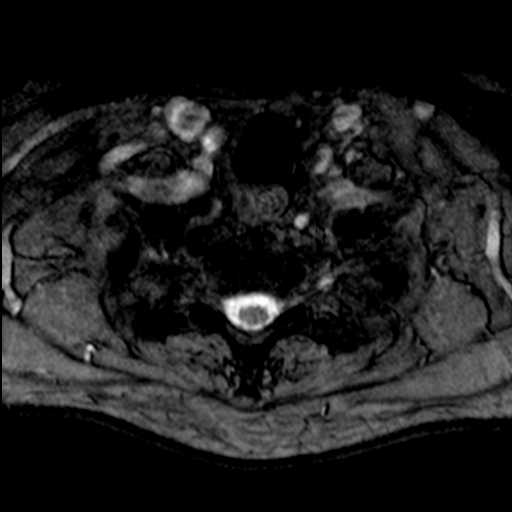
[im 5/27]
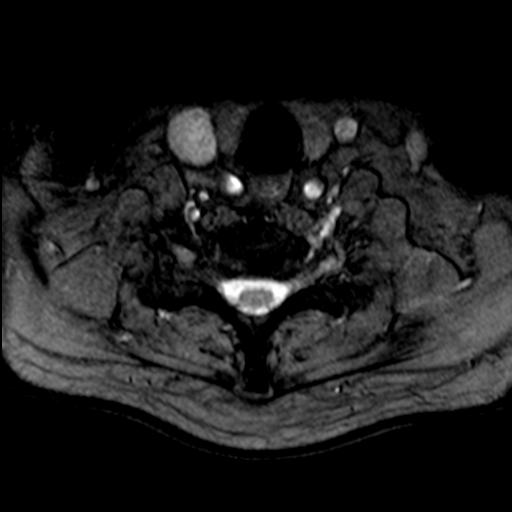
[im 9/27]
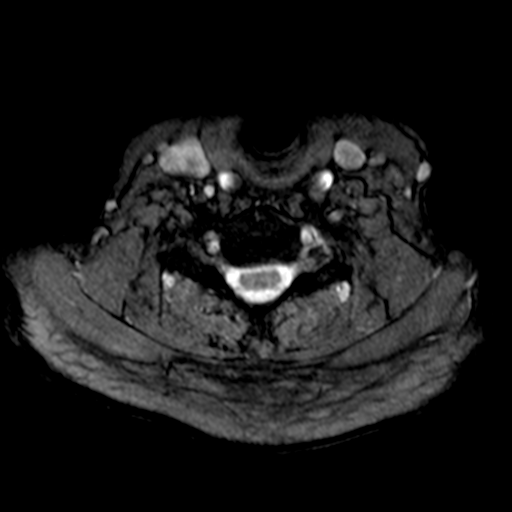
[im 11/27]
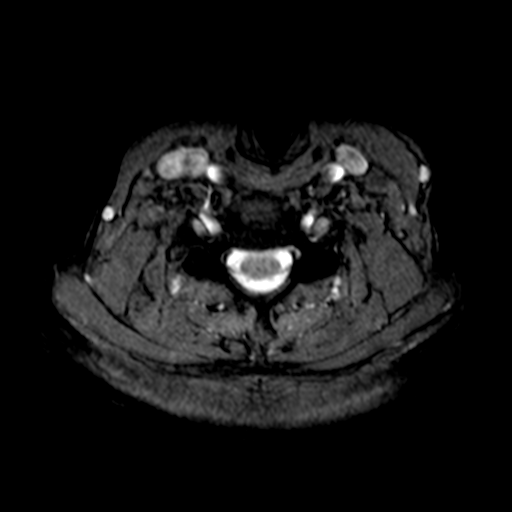

[35 of 48 positions shown; findings below may reference images not displayed]

FINDINGS: Exaggerated upper cervical lordosis. Spurring at the craniocervical
junction without impingement.

No significant abnormal spinal cord signal is observed. 2 mm
degenerative anterolisthesis at C6-7 with loss of disc height.

Subtle degenerative subcortical edema at the anterior C1- 2
articulation.

Additional findings at individual levels are as follows:

C2-3: Moderate bilateral foraminal stenosis due to uncinate and
facet spurring. Small central disc protrusion.

C3-4: Prominent left and moderate to prominent right foraminal
stenosis due to uncinate and facet spurring.

C4-5: Moderate bilateral foraminal stenosis due to uncinate and
facet spurring.

C5-6: Mild bilateral foraminal stenosis due to uncinate and facet
spurring. Borderline central narrowing of the thecal sac due to
small central disc protrusion.

C6-7:  No impingement.  Mild disc bulge and mild uncinate spurring.

C7-T1:  No impingement.  Mild disc bulge.
IMPRESSION: 1. Cervical spondylosis and degenerative disc disease, causing
prominent impingement at C3-4 ; moderate impingement at C2- 3 and
C4-5 ; mild impingement at C5-6, as detailed above.
2. Exaggerated upper cervical lordosis.

## 2017-12-25 DIAGNOSIS — R69 Illness, unspecified: Secondary | ICD-10-CM | POA: Diagnosis not present

## 2017-12-31 ENCOUNTER — Ambulatory Visit
Admission: RE | Admit: 2017-12-31 | Discharge: 2017-12-31 | Disposition: A | Discharge status: Home / Self Care | Payer: Medicare HMO | Source: Ambulatory Visit | Attending: Family Medicine | Admitting: Family Medicine

## 2017-12-31 ENCOUNTER — Encounter: Payer: Self-pay | Admitting: Family Medicine

## 2017-12-31 ENCOUNTER — Ambulatory Visit (INDEPENDENT_AMBULATORY_CARE_PROVIDER_SITE_OTHER): Payer: Medicare HMO | Admitting: Family Medicine

## 2017-12-31 VITALS — BP 130/80 | HR 64 | Ht 71.0 in | Wt 163.0 lb

## 2017-12-31 DIAGNOSIS — K769 Liver disease, unspecified: Secondary | ICD-10-CM | POA: Diagnosis not present

## 2017-12-31 DIAGNOSIS — R0689 Other abnormalities of breathing: Secondary | ICD-10-CM

## 2017-12-31 DIAGNOSIS — R079 Chest pain, unspecified: Secondary | ICD-10-CM | POA: Diagnosis not present

## 2017-12-31 DIAGNOSIS — R0989 Other specified symptoms and signs involving the circulatory and respiratory systems: Secondary | ICD-10-CM

## 2017-12-31 DIAGNOSIS — R198 Other specified symptoms and signs involving the digestive system and abdomen: Secondary | ICD-10-CM

## 2017-12-31 DIAGNOSIS — Z23 Encounter for immunization: Secondary | ICD-10-CM

## 2017-12-31 DIAGNOSIS — R0789 Other chest pain: Secondary | ICD-10-CM

## 2017-12-31 MED ORDER — MELOXICAM 15 MG PO TABS: 15.0000 mg | ORAL_TABLET | Freq: Every day | ORAL | 0 refills | Status: DC

## 2017-12-31 NOTE — Progress Notes (Signed)
Date:  12/31/2017   Name:  Stuart Jenkins   DOB:  [birth year]   MRN:  [id number]   Chief Complaint: Flank Pain (hurting on R) side radiating up to under the arm and around to shoulderblade) and pneum vacc (prevnar13)  Flank Pain  This is a new problem. Episode onset: 5 days ago. The problem occurs daily. The pain is present in the thoracic spine (thoracic/scapular area.). The quality of the pain is described as aching. The pain does not radiate. The pain is at a severity of 5/10. The pain is the same all the time. The symptoms are aggravated by bending ("certain way I move wants to grab"). Associated symptoms include chest pain. Pertinent negatives include no abdominal pain, bladder incontinence, bowel incontinence, dysuria, fever, headaches, numbness, paresis, paresthesias, weakness or weight loss. (Left posterior chest wall pain/norash) He has tried nothing for the symptoms. The treatment provided no relief.     Review of Systems  Constitutional: Negative for appetite change, chills, fatigue, fever, unexpected weight change and weight loss.  HENT: Negative for ear pain, facial swelling, hearing loss, nosebleeds, sneezing, sore throat and trouble swallowing.   Eyes: Negative for photophobia, pain, discharge, redness, itching and visual disturbance.  Respiratory: Negative for cough, choking, chest tightness, shortness of breath, wheezing and stridor.   Cardiovascular: Positive for chest pain. Negative for palpitations and leg swelling.  Gastrointestinal: Negative for abdominal pain, blood in stool, bowel incontinence, constipation, diarrhea, nausea, rectal pain and vomiting.  Endocrine: Negative for cold intolerance, heat intolerance, polydipsia, polyphagia and polyuria.  Genitourinary: Positive for flank pain. Negative for bladder incontinence, decreased urine volume, difficulty urinating, discharge, dysuria, frequency, hematuria, penile pain, penile swelling, scrotal swelling, testicular  pain and urgency.  Musculoskeletal: Negative for back pain, joint swelling, neck pain and neck stiffness.  Skin: Negative for color change and rash.  Allergic/Immunologic: Negative for immunocompromised state.  Neurological: Negative for dizziness, tremors, seizures, syncope, speech difficulty, weakness, light-headedness, numbness, headaches and paresthesias.  Hematological: Does not bruise/bleed easily.  Psychiatric/Behavioral: Negative for agitation, behavioral problems, confusion, dysphoric mood, hallucinations, self-injury and suicidal ideas. The patient is not nervous/anxious.     Patient Active Problem List   Diagnosis Date Noted  . Coronary artery disease involving coronary bypass graft of native heart without angina pectoris 07/30/2017  . Mixed hyperlipidemia 09/11/2016  . Benign prostatic hyperplasia with lower urinary tract symptoms 09/11/2016  . Essential hypertension 06/12/2015  . Cervical disc disorder 04/10/2015  . Hypotension 12/24/2014  . Atrial fibrillation ([place]) 12/24/2014  . Hyponatremia 12/24/2014  . Anemia 12/24/2014  . Hyperglycemia 12/24/2014  . Syncopal episodes 12/23/2014    Allergies  Allergen Reactions  . Oxycodone Hcl Other (See Comments)    Altered mental status  . Sulfa Antibiotics Rash    Past Surgical History:  Procedure Laterality Date  . APPENDECTOMY    . CARDIAC CATHETERIZATION N/A 11/08/2014   Procedure: Left Heart Cath and Coronary Angiography;  Surgeon: Yolonda Kida, MD;  Location: [place] [facility];  Service: Cardiovascular;  Laterality: N/A;  . COLONOSCOPY WITH PROPOFOL N/A 02/23/2017   Procedure: COLONOSCOPY WITH PROPOFOL;  Surgeon: Lollie Sails, MD;  Location: [facility]COPY;  Service: Endoscopy;  Laterality: N/A;  . CORONARY ANGIOPLASTY    . ELECTROPHYSIOLOGIC STUDY N/A 01/30/2015   Procedure: Cardioversion;  Surgeon: Yolonda Kida, MD;  Location: [facility] ORS;  Service: Cardiovascular;  Laterality: N/A;  . FRACTURE  SURGERY Left    arm, knee, collar bone  . FRACTURE  SURGERY    . GASTRIC RESECTION     partial  . open heart surgery  12/05/2014    Social History   Tobacco Use  . Smoking status: Former Smoker    Last attempt to quit: 11/07/1984    Years since quitting: 33.1  . Smokeless tobacco: Never Used  Substance Use Topics  . Alcohol use: Yes    Comment: less than 1 drink/week  . Drug use: No     Medication list has been reviewed and updated.  Current Meds  Medication Sig  . amLODipine (NORVASC) 5 MG tablet Take 1 tablet (5 mg total) by mouth daily.  Marland Kitchen aspirin EC 81 MG tablet Take 1 tablet (81 mg total) by mouth daily.  Marland Kitchen atorvastatin (LIPITOR) 80 MG tablet Take 1 tablet (80 mg total) by mouth daily.  . metoprolol succinate (TOPROL-XL) 25 MG 24 hr tablet Take 1 tablet (25 mg total) by mouth at bedtime for 180 doses.  . tamsulosin (FLOMAX) 0.4 MG CAPS capsule Take 1 capsule (0.4 mg total) by mouth daily.    PHQ 2/9 Scores 12/31/2017 09/11/2016 06/01/2015 04/10/2015  PHQ - 2 Score 0 0 0 0    Physical Exam  Constitutional: He is oriented to person, place, and time.  HENT:  Head: Normocephalic.  Right Ear: External ear normal.  Left Ear: External ear normal.  Nose: Nose normal.  Mouth/Throat: Oropharynx is clear and moist.  Eyes: Pupils are equal, round, and reactive to light. Conjunctivae and EOM are normal. Right eye exhibits no discharge. Left eye exhibits no discharge. No scleral icterus.  Neck: Normal range of motion. Neck supple. No JVD present. No tracheal deviation present. No thyromegaly present.  Cardiovascular: Normal rate, regular rhythm, S1 normal, S2 normal, normal heart sounds and intact distal pulses. Exam reveals no gallop and no friction rub.  No murmur heard.  No systolic murmur is present.  No diastolic murmur is present. Pulmonary/Chest: No respiratory distress. He has decreased breath sounds in the right lower field. He has no wheezes. He has no rhonchi. He has  no rales.  Abdominal: Soft. Bowel sounds are normal. He exhibits no mass. There is no hepatosplenomegaly. There is no tenderness. There is no rebound, no guarding and no CVA tenderness.  Palpable liver edge  Musculoskeletal: Normal range of motion. He exhibits no edema or tenderness.  Lymphadenopathy:    He has no cervical adenopathy.  Neurological: He is alert and oriented to person, place, and time. He has normal strength and normal reflexes. No cranial nerve deficit.  Skin: Skin is warm. No rash noted.  Nursing note and vitals reviewed.   BP 130/80   Pulse 64   Ht 5\' 11"  (1.803 m)   Wt 163 lb (73.9 kg)   BMI 22.73 kg/m   Assessment and Plan:  1. Chest wall pain History and exam c/w musculoskeletal etiology. Will initiate meloxicam 15 mg as needed. - meloxicam (MOBIC) 15 MG tablet; Take 1 tablet (15 mg total) by mouth daily.  Dispense: 30 tablet; Refill: 0  2. Decreased breath sounds at right lung base Decreased bs in right posterior lobe. Chest xray to evaluate - DG Chest 2 View; Future  3. Liver palpable Hepatic panel to determine status. Recheck in 4-6 weeks - Hepatic Function Panel (6)  4. Need for vaccination against Streptococcus pneumoniae using pneumococcal conjugate vaccine 13 Discussed and administered. - Pneumococcal conjugate vaccine 13-valent IM   Dr. Otilio Miu [facility]  12/31/2017

## 2018-01-01 ENCOUNTER — Other Ambulatory Visit: Payer: Self-pay

## 2018-01-01 DIAGNOSIS — E782 Mixed hyperlipidemia: Secondary | ICD-10-CM

## 2018-01-01 LAB — HEPATIC FUNCTION PANEL (6)
ALBUMIN: 4.4 g/dL (ref 3.5–4.8)
ALK PHOS: 111 IU/L (ref 39–117)
ALT: 49 IU/L — ABNORMAL HIGH (ref 0–44)
AST: 44 IU/L — AB (ref 0–40)
Bilirubin Total: 0.5 mg/dL (ref 0.0–1.2)
Bilirubin, Direct: 0.16 mg/dL (ref 0.00–0.40)

## 2018-01-01 MED ORDER — ATORVASTATIN CALCIUM 80 MG PO TABS: 40.0000 mg | ORAL_TABLET | Freq: Two times a day (BID) | ORAL | 1 refills | Status: DC

## 2018-02-04 ENCOUNTER — Ambulatory Visit (INDEPENDENT_AMBULATORY_CARE_PROVIDER_SITE_OTHER): Payer: Medicare HMO | Admitting: Family Medicine

## 2018-02-04 ENCOUNTER — Encounter: Payer: Self-pay | Admitting: Family Medicine

## 2018-02-04 VITALS — BP 138/88 | HR 80 | Ht 71.0 in | Wt 162.0 lb

## 2018-02-04 DIAGNOSIS — I809 Phlebitis and thrombophlebitis of unspecified site: Secondary | ICD-10-CM | POA: Diagnosis not present

## 2018-02-04 MED ORDER — MELOXICAM 15 MG PO TABS: 15.0000 mg | ORAL_TABLET | Freq: Every day | ORAL | 0 refills | Status: DC

## 2018-02-04 NOTE — Progress Notes (Signed)
Date:  02/04/2018   Name:  Stuart Jenkins   DOB:  [birth year]   MRN:  [id number]   Chief Complaint: Foot Pain (started Tuesday after walking. Bottom of L) foot has a knot and is bruised- doesn't recall stepping on anything.) Foot Pain  This is a new problem. The current episode started in the past 7 days. The problem occurs daily. The problem has been gradually worsening. Pertinent negatives include no abdominal pain, anorexia, arthralgias, change in bowel habit, chest pain, chills, congestion, coughing, diaphoresis, fatigue, fever, headaches, joint swelling, myalgias, nausea, neck pain, numbness, rash, sore throat, swollen glands, urinary symptoms, vertigo, visual change, vomiting or weakness.     Review of Systems  Constitutional: Negative for chills, diaphoresis, fatigue and fever.  HENT: Negative for congestion, drooling, ear discharge, ear pain and sore throat.   Respiratory: Negative for cough, shortness of breath and wheezing.   Cardiovascular: Negative for chest pain, palpitations and leg swelling.  Gastrointestinal: Negative for abdominal pain, anorexia, blood in stool, change in bowel habit, constipation, diarrhea, nausea and vomiting.  Endocrine: Negative for polydipsia.  Genitourinary: Negative for dysuria, frequency, hematuria and urgency.  Musculoskeletal: Negative for arthralgias, back pain, joint swelling, myalgias and neck pain.  Skin: Negative for rash.  Allergic/Immunologic: Negative for environmental allergies.  Neurological: Negative for dizziness, vertigo, weakness, numbness and headaches.  Hematological: Does not bruise/bleed easily.  Psychiatric/Behavioral: Negative for suicidal ideas. The patient is not nervous/anxious.     Patient Active Problem List   Diagnosis Date Noted  . Coronary artery disease involving coronary bypass graft of native heart without angina pectoris 07/30/2017  . Mixed hyperlipidemia 09/11/2016  . Benign prostatic hyperplasia with  lower urinary tract symptoms 09/11/2016  . Essential hypertension 06/12/2015  . Cervical disc disorder 04/10/2015  . Hypotension 12/24/2014  . Atrial fibrillation ([place]) 12/24/2014  . Hyponatremia 12/24/2014  . Anemia 12/24/2014  . Hyperglycemia 12/24/2014  . Syncopal episodes 12/23/2014    Allergies  Allergen Reactions  . Oxycodone Hcl Other (See Comments)    Altered mental status  . Sulfa Antibiotics Rash    Past Surgical History:  Procedure Laterality Date  . APPENDECTOMY    . CARDIAC CATHETERIZATION N/A 11/08/2014   Procedure: Left Heart Cath and Coronary Angiography;  Surgeon: Yolonda Kida, MD;  Location: [place] [facility];  Service: Cardiovascular;  Laterality: N/A;  . COLONOSCOPY WITH PROPOFOL N/A 02/23/2017   Procedure: COLONOSCOPY WITH PROPOFOL;  Surgeon: Lollie Sails, MD;  Location: [facility] ENDOSCOPY;  Service: Endoscopy;  Laterality: N/A;  . CORONARY ANGIOPLASTY    . ELECTROPHYSIOLOGIC STUDY N/A 01/30/2015   Procedure: Cardioversion;  Surgeon: Yolonda Kida, MD;  Location: [facility] ORS;  Service: Cardiovascular;  Laterality: N/A;  . FRACTURE SURGERY Left    arm, knee, collar bone  . FRACTURE SURGERY    . GASTRIC RESECTION     partial  . open heart surgery  12/05/2014    Social History   Tobacco Use  . Smoking status: Former Smoker    Last attempt to quit: 11/07/1984    Years since quitting: 33.2  . Smokeless tobacco: Never Used  Substance Use Topics  . Alcohol use: Yes    Comment: less than 1 drink/week  . Drug use: No     Medication list has been reviewed and updated.  Current Meds  Medication Sig  . amLODipine (NORVASC) 5 MG tablet Take 1 tablet (5 mg total) by mouth daily.  Marland Kitchen aspirin EC 81 MG tablet  Take 1 tablet (81 mg total) by mouth daily.  Marland Kitchen atorvastatin (LIPITOR) 80 MG tablet Take 0.5 tablets (40 mg total) by mouth 2 (two) times daily.  . metoprolol succinate (TOPROL-XL) 25 MG 24 hr tablet Take 1 tablet (25 mg total) by mouth at  bedtime for 180 doses.  . tamsulosin (FLOMAX) 0.4 MG CAPS capsule Take 1 capsule (0.4 mg total) by mouth daily.  . [DISCONTINUED] meloxicam (MOBIC) 15 MG tablet Take 1 tablet (15 mg total) by mouth daily.    PHQ 2/9 Scores 02/04/2018 12/31/2017 09/11/2016 06/01/2015  PHQ - 2 Score 0 0 0 0  PHQ- 9 Score 0 - - -    Physical Exam  Constitutional: He is oriented to person, place, and time.  HENT:  Head: Normocephalic.  Right Ear: External ear normal.  Left Ear: External ear normal.  Nose: Nose normal.  Mouth/Throat: Oropharynx is clear and moist.  Eyes: Pupils are equal, round, and reactive to light. Conjunctivae and EOM are normal. Right eye exhibits no discharge. Left eye exhibits no discharge. No scleral icterus.  Neck: Normal range of motion. Neck supple. No JVD present. No tracheal deviation present. No thyromegaly present.  Cardiovascular: Normal rate, regular rhythm, normal heart sounds and intact distal pulses. Exam reveals no gallop and no friction rub.  No murmur heard. Pulmonary/Chest: Breath sounds normal. No respiratory distress. He has no wheezes. He has no rales.  Abdominal: Soft. Bowel sounds are normal. He exhibits no mass. There is no hepatosplenomegaly. There is no tenderness. There is no rebound, no guarding and no CVA tenderness.  Musculoskeletal: Normal range of motion. He exhibits no edema or tenderness.  Lymphadenopathy:    He has no cervical adenopathy.  Neurological: He is alert and oriented to person, place, and time. He has normal strength and normal reflexes. No cranial nerve deficit.  Skin: Skin is warm. No rash noted. No erythema. No pallor.  Bluish nodular area of left sole. No blanching with glass slide. Tender. Firm.   Nursing note and vitals reviewed.   BP 138/88   Pulse 80   Ht 5\' 11"  (1.803 m)   Wt 162 lb (73.5 kg)   BMI 22.59 kg/m   Assessment and Plan:  1. Thrombophlebitis Vs thrombosis/blue nevus/lake or FB. Referral to podiatry. Initiate  Meloxican 15 mg daily. - meloxicam (MOBIC) 15 MG tablet; Take 1 tablet (15 mg total) by mouth daily.  Dispense: 30 tablet; Refill: 0 - Ambulatory referral to Podiatry   Dr. Otilio Miu [facility]  02/04/2018

## 2018-02-05 DIAGNOSIS — M79672 Pain in left foot: Secondary | ICD-10-CM | POA: Diagnosis not present

## 2018-02-05 DIAGNOSIS — I8002 Phlebitis and thrombophlebitis of superficial vessels of left lower extremity: Secondary | ICD-10-CM | POA: Diagnosis not present

## 2018-02-10 ENCOUNTER — Other Ambulatory Visit (INDEPENDENT_AMBULATORY_CARE_PROVIDER_SITE_OTHER): Payer: Self-pay | Admitting: Vascular Surgery

## 2018-02-10 DIAGNOSIS — I8393 Asymptomatic varicose veins of bilateral lower extremities: Secondary | ICD-10-CM

## 2018-02-12 ENCOUNTER — Other Ambulatory Visit: Payer: Self-pay

## 2018-02-12 ENCOUNTER — Ambulatory Visit (INDEPENDENT_AMBULATORY_CARE_PROVIDER_SITE_OTHER): Payer: Medicare HMO | Admitting: Vascular Surgery

## 2018-02-12 ENCOUNTER — Ambulatory Visit (INDEPENDENT_AMBULATORY_CARE_PROVIDER_SITE_OTHER): Payer: Medicare HMO

## 2018-02-12 ENCOUNTER — Encounter

## 2018-02-12 ENCOUNTER — Encounter (INDEPENDENT_AMBULATORY_CARE_PROVIDER_SITE_OTHER): Payer: Self-pay | Admitting: Vascular Surgery

## 2018-02-12 VITALS — BP 135/80 | HR 65 | Resp 16 | Ht 71.0 in | Wt 162.0 lb

## 2018-02-12 DIAGNOSIS — E782 Mixed hyperlipidemia: Secondary | ICD-10-CM | POA: Diagnosis not present

## 2018-02-12 DIAGNOSIS — Z87891 Personal history of nicotine dependence: Secondary | ICD-10-CM | POA: Diagnosis not present

## 2018-02-12 DIAGNOSIS — I8393 Asymptomatic varicose veins of bilateral lower extremities: Secondary | ICD-10-CM | POA: Diagnosis not present

## 2018-02-12 DIAGNOSIS — I10 Essential (primary) hypertension: Secondary | ICD-10-CM

## 2018-02-12 DIAGNOSIS — M79609 Pain in unspecified limb: Secondary | ICD-10-CM | POA: Insufficient documentation

## 2018-02-12 DIAGNOSIS — M79672 Pain in left foot: Secondary | ICD-10-CM | POA: Diagnosis not present

## 2018-02-12 NOTE — Progress Notes (Signed)
Patient ID: Stuart Jenkins, male   DOB: 14-Jul-1940, 77 y.o.   MRN: [id number]  Chief Complaint  Patient presents with  . Follow-up    ultrasound    HPI Stuart Jenkins is a 77 y.o. male.  I am asked to see the patient by Caryl Comes for pain, discoloration, and some prominent varicosities on the left foot.  There was concern he may have some sort of phlebitis.  The patient started having pain about 2 to 3 weeks ago without any clear inciting event or causative factor.  He denies any trauma, injury, or other causative event.  He reports no previous history of DVT or superficial thrombophlebitis.  He says the first week the pain was very severe and this week the pain has been fairly dull and not nearly as severe.  This is on the underside of his foot.  He was getting a walking boot by the podiatrist but says this made it worse.  He denies fevers or chills.  No chest pain or shortness of breath. A venous study was done today which showed no evidence of DVT, superficial thrombophlebitis, or significant superficial venous reflux in the left lower extremity.     Past Medical History:  Diagnosis Date  . Atrial fibrillation ([place])   . BPH (benign prostatic hypertrophy)   . Colon polyp   . Coronary artery disease   . Gout   . Hyperlipidemia   . Hypertension   . Peptic ulcer disease     Past Surgical History:  Procedure Laterality Date  . APPENDECTOMY    . CARDIAC CATHETERIZATION N/A 11/08/2014   Procedure: Left Heart Cath and Coronary Angiography;  Surgeon: Yolonda Kida, MD;  Location: [facility];  Service: Cardiovascular;  Laterality: N/A;  . COLONOSCOPY WITH PROPOFOL N/A 02/23/2017   Procedure: COLONOSCOPY WITH PROPOFOL;  Surgeon: Lollie Sails, MD;  Location: [facility] ENDOSCOPY;  Service: Endoscopy;  Laterality: N/A;  . CORONARY ANGIOPLASTY    . ELECTROPHYSIOLOGIC STUDY N/A 01/30/2015   Procedure: Cardioversion;  Surgeon: Yolonda Kida, MD;  Location: [facility] ORS;   Service: Cardiovascular;  Laterality: N/A;  . FRACTURE SURGERY Left    arm, knee, collar bone  . FRACTURE SURGERY    . GASTRIC RESECTION     partial  . open heart surgery  12/05/2014    Family History  Problem Relation Age of Onset  . Hypertension Brother   No bleeding or clotting disorders  Social History Social History   Tobacco Use  . Smoking status: Former Smoker    Last attempt to quit: 11/07/1984    Years since quitting: 33.2  . Smokeless tobacco: Never Used  Substance Use Topics  . Alcohol use: Yes    Comment: less than 1 drink/week  . Drug use: No    Allergies  Allergen Reactions  . Oxycodone Hcl Other (See Comments)    Altered mental status  . Sulfa Antibiotics Rash    Current Outpatient Medications  Medication Sig Dispense Refill  . amLODipine (NORVASC) 5 MG tablet Take 1 tablet (5 mg total) by mouth daily. 90 tablet 1  . aspirin EC 81 MG tablet Take 1 tablet (81 mg total) by mouth daily. 100 tablet 3  . atorvastatin (LIPITOR) 80 MG tablet Take 0.5 tablets (40 mg total) by mouth 2 (two) times daily. 90 tablet 1  . metoprolol succinate (TOPROL-XL) 25 MG 24 hr tablet Take 1 tablet (25 mg total) by mouth at bedtime for 180 doses.  90 tablet 1  . tamsulosin (FLOMAX) 0.4 MG CAPS capsule Take 1 capsule (0.4 mg total) by mouth daily. 90 capsule 1  . meloxicam (MOBIC) 15 MG tablet Take 1 tablet (15 mg total) by mouth daily. (Patient not taking: Reported on 02/12/2018) 30 tablet 0   No current facility-administered medications for this visit.       REVIEW OF SYSTEMS (Negative unless checked)  Constitutional: [] Weight loss  [] Fever  [] Chills Cardiac: [] Chest pain   [] Chest pressure   [x] Palpitations   [] Shortness of breath when laying flat   [] Shortness of breath at rest   [x] Shortness of breath with exertion. Vascular:  [] Pain in legs with walking   [] Pain in legs at rest   [] Pain in legs when laying flat   [] Claudication   [x] Pain in feet when walking  [x] Pain in  feet at rest  [] Pain in feet when laying flat   [] History of DVT   [] Phlebitis   [] Swelling in legs   [x] Varicose veins   [] Non-healing ulcers Pulmonary:   [] Uses home oxygen   [] Productive cough   [] Hemoptysis   [] Wheeze  [] COPD   [] Asthma Neurologic:  [] Dizziness  [] Blackouts   [] Seizures   [] History of stroke   [] History of TIA  [] Aphasia   [] Temporary blindness   [] Dysphagia   [] Weakness or numbness in arms   [] Weakness or numbness in legs Musculoskeletal:  [] Arthritis   [] Joint swelling   [] Joint pain   [] Low back pain Hematologic:  [] Easy bruising  [] Easy bleeding   [] Hypercoagulable state   [] Anemic  [] Hepatitis Gastrointestinal:  [] Blood in stool   [] Vomiting blood  [] Gastroesophageal reflux/heartburn   [] Abdominal pain Genitourinary:  [] Chronic kidney disease   [] Difficult urination  [] Frequent urination  [] Burning with urination   [] Hematuria Skin:  [] Rashes   [] Ulcers   [] Wounds Psychological:  [] History of anxiety   []  History of major depression.    Physical Exam BP 135/80   Pulse 65   Resp 16   Ht 5\' 11"  (1.803 m)   Wt 162 lb (73.5 kg)   BMI 22.59 kg/m  Gen:  WD/WN, NAD.  Appears younger than stated age Head: [place]/AT, No temporalis wasting.  Ear/Nose/Throat: Hearing grossly intact, dentition good Eyes: Sclera non-icteric. Conjunctiva clear Neck: Supple. Trachea midline Pulmonary:  Good air movement, no use of accessory muscles, respirations not labored.  Cardiac: Irregular Vascular: Varicosities diffuse and measuring up to 1-2 mm in the right lower extremity        Varicosities scattered and measuring up to 1 mm in the left lower extremity Vessel Right Left  Radial Palpable Palpable                          PT Palpable Palpable  DP Palpable Palpable   Gastrointestinal: soft, non-tender/non-distended.  Musculoskeletal: M/S 5/5 throughout.   No RLE edema.  Trace LLE edema.  The area in question looks like a varicosity with some bruising on the base of the left foot.   This is not particularly tender to palpation.  No surrounding erythema. Neurologic: Sensation grossly intact in extremities.  Symmetrical.  Speech is fluent.  Psychiatric: Judgment intact, Mood & affect appropriate for pt's clinical situation. Dermatologic: No rashes or ulcers noted.  No cellulitis or open wounds.    Radiology No results found.  Labs Recent Results (from the past 2160 hour(s))  Hepatic Function Panel (6)     Status: Abnormal   Collection Time: 12/31/17  9:28 AM  Result Value Ref Range   Albumin 4.4 3.5 - 4.8 g/dL   Bilirubin Total 0.5 0.0 - 1.2 mg/dL   Bilirubin, Direct 0.16 0.00 - 0.40 mg/dL   Alkaline Phosphatase 111 39 - 117 IU/L   AST 44 (H) 0 - 40 IU/L   ALT 49 (H) 0 - 44 IU/L    Assessment/Plan:  Essential hypertension blood pressure control important in reducing the progression of atherosclerotic disease. On appropriate oral medications.   Mixed hyperlipidemia lipid control important in reducing the progression of atherosclerotic disease. Continue statin therapy   Pain in limb A venous study was done today which showed no evidence of DVT, superficial thrombophlebitis, or significant superficial venous reflux in the left lower extremity. He may have had a superficial varicosity burst and create some bruising and pain in that area.  This is not a concerning finding and should pass over time.  I discussed potentially using compression stockings and elevating his legs.  I will see him back as needed.        Leotis Pain 02/12/2018, 10:48 AM   This note was created with Dragon medical transcription system.  Any errors from dictation are unintentional.

## 2018-02-12 NOTE — Assessment & Plan Note (Signed)
blood pressure control important in reducing the progression of atherosclerotic disease. On appropriate oral medications.  

## 2018-02-12 NOTE — Assessment & Plan Note (Signed)
A venous study was done today which showed no evidence of DVT, superficial thrombophlebitis, or significant superficial venous reflux in the left lower extremity. He may have had a superficial varicosity burst and create some bruising and pain in that area.  This is not a concerning finding and should pass over time.  I discussed potentially using compression stockings and elevating his legs.  I will see him back as needed.

## 2018-02-12 NOTE — Assessment & Plan Note (Signed)
lipid control important in reducing the progression of atherosclerotic disease. Continue statin therapy  

## 2018-02-28 ENCOUNTER — Other Ambulatory Visit: Payer: Self-pay | Admitting: Family Medicine

## 2018-02-28 DIAGNOSIS — I10 Essential (primary) hypertension: Secondary | ICD-10-CM

## 2018-03-05 ENCOUNTER — Other Ambulatory Visit: Payer: Self-pay | Admitting: Family Medicine

## 2018-03-05 DIAGNOSIS — I10 Essential (primary) hypertension: Secondary | ICD-10-CM

## 2018-03-31 DIAGNOSIS — H2513 Age-related nuclear cataract, bilateral: Secondary | ICD-10-CM | POA: Diagnosis not present

## 2018-05-05 DIAGNOSIS — H2511 Age-related nuclear cataract, right eye: Secondary | ICD-10-CM | POA: Diagnosis not present

## 2018-05-17 ENCOUNTER — Encounter: Payer: Self-pay | Admitting: *Deleted

## 2018-05-31 ENCOUNTER — Other Ambulatory Visit: Payer: Self-pay | Admitting: Family Medicine

## 2018-05-31 DIAGNOSIS — N401 Enlarged prostate with lower urinary tract symptoms: Secondary | ICD-10-CM

## 2018-05-31 DIAGNOSIS — I10 Essential (primary) hypertension: Secondary | ICD-10-CM

## 2018-06-01 ENCOUNTER — Ambulatory Visit: Payer: Medicare HMO | Admitting: Certified Registered Nurse Anesthetist

## 2018-06-01 ENCOUNTER — Encounter: Payer: Self-pay | Admitting: Ophthalmology

## 2018-06-01 ENCOUNTER — Encounter
Admission: RE | Disposition: A | Discharge status: Home / Self Care | Payer: Self-pay | Source: Home / Self Care | Attending: Ophthalmology

## 2018-06-01 ENCOUNTER — Other Ambulatory Visit: Payer: Self-pay

## 2018-06-01 ENCOUNTER — Ambulatory Visit
Admission: RE | Admit: 2018-06-01 | Discharge: 2018-06-01 | Disposition: A | Discharge status: Home / Self Care | Payer: Medicare HMO | Attending: Ophthalmology | Admitting: Ophthalmology

## 2018-06-01 DIAGNOSIS — Z87891 Personal history of nicotine dependence: Secondary | ICD-10-CM | POA: Diagnosis not present

## 2018-06-01 DIAGNOSIS — Z903 Acquired absence of stomach [part of]: Secondary | ICD-10-CM | POA: Diagnosis not present

## 2018-06-01 DIAGNOSIS — Z882 Allergy status to sulfonamides status: Secondary | ICD-10-CM | POA: Insufficient documentation

## 2018-06-01 DIAGNOSIS — I4891 Unspecified atrial fibrillation: Secondary | ICD-10-CM | POA: Diagnosis not present

## 2018-06-01 DIAGNOSIS — I2510 Atherosclerotic heart disease of native coronary artery without angina pectoris: Secondary | ICD-10-CM | POA: Insufficient documentation

## 2018-06-01 DIAGNOSIS — Z79899 Other long term (current) drug therapy: Secondary | ICD-10-CM | POA: Insufficient documentation

## 2018-06-01 DIAGNOSIS — I10 Essential (primary) hypertension: Secondary | ICD-10-CM | POA: Insufficient documentation

## 2018-06-01 DIAGNOSIS — Z951 Presence of aortocoronary bypass graft: Secondary | ICD-10-CM | POA: Diagnosis not present

## 2018-06-01 DIAGNOSIS — Z885 Allergy status to narcotic agent status: Secondary | ICD-10-CM | POA: Insufficient documentation

## 2018-06-01 DIAGNOSIS — E785 Hyperlipidemia, unspecified: Secondary | ICD-10-CM | POA: Diagnosis not present

## 2018-06-01 DIAGNOSIS — Z8711 Personal history of peptic ulcer disease: Secondary | ICD-10-CM | POA: Insufficient documentation

## 2018-06-01 DIAGNOSIS — H2511 Age-related nuclear cataract, right eye: Secondary | ICD-10-CM | POA: Diagnosis not present

## 2018-06-01 DIAGNOSIS — Z7982 Long term (current) use of aspirin: Secondary | ICD-10-CM | POA: Insufficient documentation

## 2018-06-01 DIAGNOSIS — N400 Enlarged prostate without lower urinary tract symptoms: Secondary | ICD-10-CM | POA: Insufficient documentation

## 2018-06-01 DIAGNOSIS — E782 Mixed hyperlipidemia: Secondary | ICD-10-CM | POA: Diagnosis not present

## 2018-06-01 HISTORY — DX: Gastrointestinal hemorrhage, unspecified: K92.2

## 2018-06-01 HISTORY — DX: Cardiac arrhythmia, unspecified: I49.9

## 2018-06-01 HISTORY — PX: CATARACT EXTRACTION W/PHACO: SHX586

## 2018-06-01 SURGERY — PHACOEMULSIFICATION, CATARACT, WITH IOL INSERTION
Anesthesia: Monitor Anesthesia Care | Site: Eye | Laterality: Right

## 2018-06-01 MED ORDER — MOXIFLOXACIN HCL 0.5 % OP SOLN: 1.0000 [drp] | OPHTHALMIC | Status: DC | PRN

## 2018-06-01 MED ORDER — SODIUM CHLORIDE 0.9 % IV SOLN
INTRAVENOUS | Status: DC
  Administered 2018-06-01: 10:00:00 via INTRAVENOUS

## 2018-06-01 MED ORDER — NA CHONDROIT SULF-NA HYALURON 40-17 MG/ML IO SOLN
INTRAOCULAR | Status: DC | PRN
  Administered 2018-06-01: 1 mL via INTRAOCULAR

## 2018-06-01 MED ORDER — POVIDONE-IODINE 5 % OP SOLN
OPHTHALMIC | Status: DC | PRN
  Administered 2018-06-01: 1 via OPHTHALMIC

## 2018-06-01 MED ORDER — EPINEPHRINE PF 1 MG/ML IJ SOLN
INTRAOCULAR | Status: DC | PRN
  Administered 2018-06-01: 11:00:00 via OPHTHALMIC

## 2018-06-01 MED ORDER — POVIDONE-IODINE 5 % OP SOLN
OPHTHALMIC | Status: AC
  Filled 2018-06-01: qty 30

## 2018-06-01 MED ORDER — TETRACAINE HCL 0.5 % OP SOLN
1.0000 [drp] | OPHTHALMIC | Status: AC | PRN
  Administered 2018-06-01 (×2): 1 [drp] via OPHTHALMIC

## 2018-06-01 MED ORDER — ARMC OPHTHALMIC DILATING DROPS
1.0000 "application " | OPHTHALMIC | Status: AC
  Administered 2018-06-01: 10:00:00 via OPHTHALMIC
  Administered 2018-06-01: 1 via OPHTHALMIC

## 2018-06-01 MED ORDER — ARMC OPHTHALMIC DILATING DROPS
OPHTHALMIC | Status: AC
  Filled 2018-06-01: qty 0.5

## 2018-06-01 MED ORDER — MOXIFLOXACIN HCL 0.5 % OP SOLN
OPHTHALMIC | Status: AC
  Filled 2018-06-01: qty 3

## 2018-06-01 MED ORDER — LIDOCAINE HCL (PF) 4 % IJ SOLN
INTRAMUSCULAR | Status: AC
  Filled 2018-06-01: qty 5

## 2018-06-01 MED ORDER — MIDAZOLAM HCL 2 MG/2ML IJ SOLN
INTRAMUSCULAR | Status: DC | PRN
  Administered 2018-06-01 (×4): 0.5 mg via INTRAVENOUS

## 2018-06-01 MED ORDER — TETRACAINE HCL 0.5 % OP SOLN
OPHTHALMIC | Status: AC
  Administered 2018-06-01: 1 [drp] via OPHTHALMIC
  Filled 2018-06-01: qty 4

## 2018-06-01 MED ORDER — EPINEPHRINE PF 1 MG/ML IJ SOLN
INTRAMUSCULAR | Status: AC
  Filled 2018-06-01: qty 2

## 2018-06-01 MED ORDER — FENTANYL CITRATE (PF) 100 MCG/2ML IJ SOLN
INTRAMUSCULAR | Status: AC
  Filled 2018-06-01: qty 2

## 2018-06-01 MED ORDER — LIDOCAINE HCL (PF) 4 % IJ SOLN
INTRAOCULAR | Status: DC | PRN
  Administered 2018-06-01: 4 mL via OPHTHALMIC

## 2018-06-01 MED ORDER — CARBACHOL 0.01 % IO SOLN
INTRAOCULAR | Status: DC | PRN
  Administered 2018-06-01: 0.5 mL via INTRAOCULAR

## 2018-06-01 MED ORDER — MIDAZOLAM HCL 2 MG/2ML IJ SOLN
INTRAMUSCULAR | Status: AC
  Filled 2018-06-01: qty 2

## 2018-06-01 MED ORDER — NA CHONDROIT SULF-NA HYALURON 40-17 MG/ML IO SOLN
INTRAOCULAR | Status: AC
  Filled 2018-06-01: qty 1

## 2018-06-01 MED ORDER — FENTANYL CITRATE (PF) 100 MCG/2ML IJ SOLN
INTRAMUSCULAR | Status: DC | PRN
  Administered 2018-06-01 (×4): 25 ug via INTRAVENOUS

## 2018-06-01 MED ORDER — ONDANSETRON HCL 4 MG/2ML IJ SOLN
INTRAMUSCULAR | Status: DC | PRN
  Administered 2018-06-01: 4 mg via INTRAVENOUS

## 2018-06-01 MED ORDER — MOXIFLOXACIN HCL 0.5 % OP SOLN
OPHTHALMIC | Status: DC | PRN
  Administered 2018-06-01: 0.2 mL via OPHTHALMIC

## 2018-06-01 SURGICAL SUPPLY — 16 items
GLOVE BIO SURGEON STRL SZ8 (GLOVE) ×2 IMPLANT
GLOVE BIOGEL M 6.5 STRL (GLOVE) ×2 IMPLANT
GLOVE SURG LX 8.0 MICRO (GLOVE) ×1
GLOVE SURG LX STRL 8.0 MICRO (GLOVE) ×1 IMPLANT
GOWN STRL REUS W/ TWL LRG LVL3 (GOWN DISPOSABLE) ×2 IMPLANT
GOWN STRL REUS W/TWL LRG LVL3 (GOWN DISPOSABLE) ×2
LABEL CATARACT MEDS ST (LABEL) ×2 IMPLANT
LENS IOL TECNIS ITEC 25.5 (Intraocular Lens) ×1 IMPLANT
PACK CATARACT (MISCELLANEOUS) ×2 IMPLANT
PACK CATARACT BRASINGTON LX (MISCELLANEOUS) ×2 IMPLANT
PACK EYE AFTER SURG (MISCELLANEOUS) ×2 IMPLANT
SOL BSS BAG (MISCELLANEOUS) ×2
SOLUTION BSS BAG (MISCELLANEOUS) ×1 IMPLANT
SYR 5ML LL (SYRINGE) ×2 IMPLANT
WATER STERILE IRR 250ML POUR (IV SOLUTION) ×2 IMPLANT
WIPE NON LINTING 3.25X3.25 (MISCELLANEOUS) ×2 IMPLANT

## 2018-06-01 NOTE — Discharge Instructions (Addendum)
Eye Surgery Discharge Instructions  Expect mild scratchy sensation or mild soreness. DO NOT RUB YOUR EYE!  The day of surgery:  Minimal physical activity, but bed rest is not required  No reading, computer work, or close hand work  No bending, lifting, or straining.  May watch TV  For 24 hours:  No driving, legal decisions, or alcoholic beverages  Safety precautions  Eat anything you prefer: It is better to start with liquids, then soup then solid foods.  Solar shield eyeglasses should be worn for comfort in the sunlight/patch while sleeping  Resume all regular medications including aspirin or Coumadin if these were discontinued prior to surgery. You may shower, bathe, shave, or wash your hair. Tylenol may be taken for mild discomfort. FOLLOW DR. PORFILIO'S EYE DROP INSTRUCTION SHEET AS REVIEWED.  Call your doctor if you experience significant pain, nausea, or vomiting, fever > 101 or other signs of infection. [phone] or [phone] Specific instructions:  Follow-up Information    Birder Robson, MD Follow up.   Specialty:  Ophthalmology Why:  06/02/18 @ 10:50 am  Contact information: [place]  [phone]

## 2018-06-01 NOTE — Transfer of Care (Signed)
Immediate Anesthesia Transfer of Care Note  Patient: Stuart Jenkins [facility]  Procedure(s) Performed: CATARACT EXTRACTION PHACO AND INTRAOCULAR LENS PLACEMENT (IOC) RIGHT (Right Eye)  Patient Location: PACU  Anesthesia Type:MAC  Level of Consciousness: awake, alert  and oriented  Airway & Oxygen Therapy: Patient Spontanous Breathing  Post-op Assessment: Report given to RN and Post -op Vital signs reviewed and stable  Post vital signs: Reviewed and stable  Last Vitals:  Vitals Value Taken Time  BP    Temp    Pulse    Resp    SpO2      Last Pain:  Vitals:   06/01/18 0927  TempSrc: Oral  PainSc: 0-No pain         Complications: No apparent anesthesia complications

## 2018-06-01 NOTE — Anesthesia Post-op Follow-up Note (Signed)
Anesthesia QCDR form completed.        

## 2018-06-01 NOTE — H&P (Signed)
All labs reviewed. Abnormal studies sent to patients PCP when indicated.  Previous H&P reviewed, patient examined, there are NO CHANGES.  Stuart Hillman Porfilio3/10/202010:47 AM

## 2018-06-01 NOTE — Op Note (Signed)
PREOPERATIVE DIAGNOSIS:  Nuclear sclerotic cataract of the right eye.   POSTOPERATIVE DIAGNOSIS:  NUCLEAR SCLEROTIC CATARACT RIGHT EYE   OPERATIVE PROCEDURE: Procedure(s): CATARACT EXTRACTION PHACO AND INTRAOCULAR LENS PLACEMENT ([place]) RIGHT   SURGEON:  Birder Robson, MD.   ANESTHESIA:  Anesthesiologist: Durenda Hurt, MD CRNA: Willette Alma, CRNA  1.      Managed anesthesia care. 2.      0.36ml of Shugarcaine was instilled in the eye following the paracentesis.   COMPLICATIONS:  None.   TECHNIQUE:   Stop and chop   DESCRIPTION OF PROCEDURE:  The patient was examined and consented in the preoperative holding area where the aforementioned topical anesthesia was applied to the right eye and then brought back to the Operating Room where the right eye was prepped and draped in the usual sterile ophthalmic fashion and a lid speculum was placed. A paracentesis was created with the side port blade and the anterior chamber was filled with viscoelastic. A near clear corneal incision was performed with the steel keratome. A continuous curvilinear capsulorrhexis was performed with a cystotome followed by the capsulorrhexis forceps. Hydrodissection and hydrodelineation were carried out with BSS on a blunt cannula. The lens was removed in a stop and chop  technique and the remaining cortical material was removed with the irrigation-aspiration handpiece. The capsular bag was inflated with viscoelastic and the Technis [id number]  lens was placed in the capsular bag without complication. The remaining viscoelastic was removed from the eye with the irrigation-aspiration handpiece. The wounds were hydrated. The anterior chamber was flushed with Miostat and the eye was inflated to physiologic pressure. 0.55ml of Vigamox was placed in the anterior chamber. The wounds were found to be water tight. The eye was dressed with Vigamox. The patient was given protective glasses to wear throughout the day and a  shield with which to sleep tonight. The patient was also given drops with which to begin a drop regimen today and will follow-up with me in one day. Implant Name Type Inv. Item Serial No. Manufacturer Lot No. LRB No. Used  LENS IOL DIOP 25.5 - [id number] Intraocular Lens LENS IOL DIOP 25.5 [id number] AMO  Right 1   Procedure(s) with comments: CATARACT EXTRACTION PHACO AND INTRAOCULAR LENS PLACEMENT (IOC) RIGHT (Right) - [place] 00:50.5 CDE 7.01 Fluid Pack Lot # [id number]  Electronically signed: Birder Robson 06/01/2018 11:19 AM

## 2018-06-01 NOTE — Anesthesia Preprocedure Evaluation (Addendum)
Anesthesia Evaluation  Patient identified by MRN, date of birth, ID band Patient awake    Reviewed: Allergy & Precautions, H&P , NPO status , Patient's Chart, lab work & pertinent test results  History of Anesthesia Complications Negative for: history of anesthetic complications  Airway Mallampati: III  TM Distance: >3 FB     Dental  (+) Poor Dentition, Chipped, Missing   Pulmonary neg shortness of breath, former smoker,    breath sounds clear to auscultation       Cardiovascular hypertension, (-) angina+ CAD and + CABG (2016)  + dysrhythmias (s/p cardioversion) Atrial Fibrillation  Rhythm:regular Rate:Normal  Syncope listed in chart.  Pt denies this.   Neuro/Psych negative neurological ROS  negative psych ROS   GI/Hepatic Neg liver ROS, PUD,   Endo/Other  negative endocrine ROS  Renal/GU      Musculoskeletal   Abdominal   Peds  Hematology  (+) Blood dyscrasia, anemia ,   Anesthesia Other Findings Past Medical History: No date: Atrial fibrillation ([facility]) No date: BPH (benign prostatic hypertrophy) No date: Colon polyp No date: Coronary artery disease No date: Dysrhythmia     Comment:  AFIB POST CABG / CARDIOVERSION No date: Gastric bleed     Comment:  SURGERY No date: Gout No date: Hyperlipidemia No date: Hypertension No date: Peptic ulcer disease  Past Surgical History: No date: APPENDECTOMY 11/08/2014: CARDIAC CATHETERIZATION; N/A     Comment:  Procedure: Left Heart Cath and Coronary Angiography;                Surgeon: Yolonda Kida, MD;  Location: [facility];  Service: Cardiovascular;  Laterality: N/A; No date: CARDIOVERSION No date: COLONOSCOPY 02/23/2017: COLONOSCOPY WITH PROPOFOL; N/A     Comment:  Procedure: COLONOSCOPY WITH PROPOFOL;  Surgeon:               Lollie Sails, MD;  Location: [facility] ENDOSCOPY;                Service: Endoscopy;  Laterality: N/A; No  date: CORONARY ANGIOPLASTY No date: CORONARY ARTERY BYPASS GRAFT 01/30/2015: ELECTROPHYSIOLOGIC STUDY; N/A     Comment:  Procedure: Cardioversion;  Surgeon: Yolonda Kida,               MD;  Location: [facility] ORS;  Service: Cardiovascular;                Laterality: N/A; No date: FRACTURE SURGERY; Left     Comment:  arm, knee, collar bone No date: FRACTURE SURGERY No date: GASTRIC RESECTION     Comment:  partial 12/05/2014: open heart surgery  BMI    Body Mass Index:  23.71 kg/m      Reproductive/Obstetrics negative OB ROS                            Anesthesia Physical Anesthesia Plan  ASA: III  Anesthesia Plan: MAC   Post-op Pain Management:    Induction:   PONV Risk Score and Plan:   Airway Management Planned:   Additional Equipment:   Intra-op Plan:   Post-operative Plan:   Informed Consent: I have reviewed the patients History and Physical, chart, labs and discussed the procedure including the risks, benefits and alternatives for the proposed anesthesia with the patient or authorized representative who has indicated his/her understanding and acceptance.  Plan Discussed with: Anesthesiologist and CRNA  Anesthesia Plan Comments:         Anesthesia Quick Evaluation

## 2018-06-02 NOTE — Anesthesia Postprocedure Evaluation (Signed)
Anesthesia Post Note  Patient: Stuart Jenkins  Procedure(s) Performed: CATARACT EXTRACTION PHACO AND INTRAOCULAR LENS PLACEMENT (IOC) RIGHT (Right Eye)  Patient location during evaluation: PACU Anesthesia Type: MAC Level of consciousness: awake and alert Pain management: pain level controlled Vital Signs Assessment: post-procedure vital signs reviewed and stable Respiratory status: spontaneous breathing, nonlabored ventilation and respiratory function stable Cardiovascular status: stable and blood pressure returned to baseline Postop Assessment: no apparent nausea or vomiting Anesthetic complications: no     Last Vitals:  Vitals:   06/01/18 0927 06/01/18 1119  BP: 132/77 116/64  Pulse: 60 (!) 58  Resp: 18 18  Temp: 36.7 C (!) 36.1 C  SpO2: 100% 100%    Last Pain:  Vitals:   06/01/18 1119  TempSrc: Tympanic  PainSc: 0-No pain                 Durenda Hurt

## 2018-06-09 DIAGNOSIS — L570 Actinic keratosis: Secondary | ICD-10-CM | POA: Diagnosis not present

## 2018-06-09 DIAGNOSIS — L578 Other skin changes due to chronic exposure to nonionizing radiation: Secondary | ICD-10-CM | POA: Diagnosis not present

## 2018-06-26 ENCOUNTER — Other Ambulatory Visit: Payer: Self-pay | Admitting: Family Medicine

## 2018-06-26 DIAGNOSIS — I10 Essential (primary) hypertension: Secondary | ICD-10-CM

## 2018-06-29 ENCOUNTER — Other Ambulatory Visit: Payer: Self-pay | Admitting: Family Medicine

## 2018-06-29 DIAGNOSIS — E782 Mixed hyperlipidemia: Secondary | ICD-10-CM

## 2018-07-20 ENCOUNTER — Other Ambulatory Visit: Payer: Self-pay | Admitting: Family Medicine

## 2018-07-20 DIAGNOSIS — I10 Essential (primary) hypertension: Secondary | ICD-10-CM

## 2018-07-21 ENCOUNTER — Encounter: Payer: Self-pay | Admitting: Family Medicine

## 2018-07-21 ENCOUNTER — Other Ambulatory Visit: Payer: Self-pay | Admitting: Family Medicine

## 2018-07-21 ENCOUNTER — Ambulatory Visit (INDEPENDENT_AMBULATORY_CARE_PROVIDER_SITE_OTHER): Payer: Medicare HMO | Admitting: Family Medicine

## 2018-07-21 ENCOUNTER — Other Ambulatory Visit: Payer: Self-pay

## 2018-07-21 VITALS — BP 120/80 | HR 76 | Ht 71.0 in | Wt 167.0 lb

## 2018-07-21 DIAGNOSIS — E782 Mixed hyperlipidemia: Secondary | ICD-10-CM | POA: Diagnosis not present

## 2018-07-21 DIAGNOSIS — N401 Enlarged prostate with lower urinary tract symptoms: Secondary | ICD-10-CM

## 2018-07-21 DIAGNOSIS — R69 Illness, unspecified: Secondary | ICD-10-CM | POA: Diagnosis not present

## 2018-07-21 DIAGNOSIS — I10 Essential (primary) hypertension: Secondary | ICD-10-CM | POA: Diagnosis not present

## 2018-07-21 MED ORDER — TAMSULOSIN HCL 0.4 MG PO CAPS: 0.4000 mg | ORAL_CAPSULE | Freq: Every day | ORAL | 1 refills | Status: DC

## 2018-07-21 MED ORDER — ATORVASTATIN CALCIUM 40 MG PO TABS: 40.0000 mg | ORAL_TABLET | Freq: Every day | ORAL | 1 refills | Status: DC

## 2018-07-21 MED ORDER — AMLODIPINE BESYLATE 5 MG PO TABS: 5.0000 mg | ORAL_TABLET | Freq: Every day | ORAL | 1 refills | Status: DC

## 2018-07-21 MED ORDER — METOPROLOL SUCCINATE ER 25 MG PO TB24: ORAL_TABLET | ORAL | 1 refills | Status: DC

## 2018-07-21 NOTE — Progress Notes (Signed)
Date:  07/21/2018   Name:  Stuart Jenkins   DOB:  05-12-1940   MRN:  [id number]   Chief Complaint: Hypertension; Hyperlipidemia; and Benign Prostatic Hypertrophy  Hypertension  This is a chronic problem. The current episode started more than 1 year ago. The problem has been gradually worsening since onset. The problem is controlled. Pertinent negatives include no anxiety, blurred vision, chest pain, headaches, malaise/fatigue, neck pain, orthopnea, palpitations, peripheral edema, PND, shortness of breath or sweats. There are no associated agents to hypertension. Risk factors for coronary artery disease include dyslipidemia, male gender and post-menopausal state. Past treatments include calcium channel blockers and beta blockers. The current treatment provides moderate improvement. There are no compliance problems.  There is no history of angina, kidney disease, CAD/MI, CVA, heart failure, left ventricular hypertrophy, PVD or retinopathy. There is no history of chronic renal disease, a hypertension causing med or renovascular disease.  Hyperlipidemia  This is a chronic problem. The current episode started more than 1 year ago. The problem is controlled. Recent lipid tests were reviewed and are normal. He has no history of chronic renal disease, diabetes, hypothyroidism, liver disease, obesity or nephrotic syndrome. Factors aggravating his hyperlipidemia include thiazides. Pertinent negatives include no chest pain, focal sensory loss, focal weakness, leg pain, myalgias or shortness of breath. He is currently on no antihyperlipidemic treatment. The current treatment provides mild improvement of lipids. There are no compliance problems.  Risk factors for coronary artery disease include dyslipidemia, hypertension and male sex.  Benign Prostatic Hypertrophy  This is a chronic problem. The current episode started more than 1 year ago. The problem has been gradually improving since onset. Irritative  symptoms include nocturia. Irritative symptoms do not include frequency or urgency. Obstructive symptoms do not include dribbling, incomplete emptying, an intermittent stream, a slower stream, straining or a weak stream. Pertinent negatives include no chills, dysuria, genital pain, hematuria, hesitancy, nausea or vomiting. Past treatments include tamsulosin. The treatment provided moderate relief. He has been using treatment for 2 or more years.    Review of Systems  Constitutional: Negative for chills, fever and malaise/fatigue.  HENT: Negative for drooling, ear discharge, ear pain, postnasal drip, rhinorrhea, sinus pain and sore throat.   Eyes: Negative for blurred vision.  Respiratory: Negative for cough, chest tightness, shortness of breath and wheezing.   Cardiovascular: Negative for chest pain, palpitations, orthopnea, leg swelling and PND.  Gastrointestinal: Negative for abdominal pain, blood in stool, constipation, diarrhea, nausea and vomiting.  Endocrine: Negative for polydipsia.  Genitourinary: Positive for nocturia. Negative for dysuria, frequency, hematuria, hesitancy, incomplete emptying and urgency.  Musculoskeletal: Negative for back pain, myalgias and neck pain.  Skin: Negative for rash.  Allergic/Immunologic: Negative for environmental allergies.  Neurological: Negative for dizziness, focal weakness and headaches.  Hematological: Does not bruise/bleed easily.  Psychiatric/Behavioral: Negative for suicidal ideas. The patient is not nervous/anxious.     Patient Active Problem List   Diagnosis Date Noted   Pain in limb 02/12/2018   Coronary artery disease involving coronary bypass graft of native heart without angina pectoris 07/30/2017   Mixed hyperlipidemia 09/11/2016   Benign prostatic hyperplasia with lower urinary tract symptoms 09/11/2016   Essential hypertension 06/12/2015   Cervical disc disorder 04/10/2015   Hypotension 12/24/2014   Atrial fibrillation  ([place]) 12/24/2014   Hyponatremia 12/24/2014   Anemia 12/24/2014   Hyperglycemia 12/24/2014   Syncopal episodes 12/23/2014    Allergies  Allergen Reactions   Oxycodone Hcl Other (See Comments)  Altered mental status   Sulfa Antibiotics Rash    Past Surgical History:  Procedure Laterality Date   APPENDECTOMY     CARDIAC CATHETERIZATION N/A 11/08/2014   Procedure: Left Heart Cath and Coronary Angiography;  Surgeon: Yolonda Kida, MD;  Location: [place] [facility];  Service: Cardiovascular;  Laterality: N/A;   CARDIOVERSION     CATARACT EXTRACTION W/PHACO Right 06/01/2018   Procedure: CATARACT EXTRACTION PHACO AND INTRAOCULAR LENS PLACEMENT ([place]) RIGHT;  Surgeon: Birder Robson, MD;  Location: [facility];  Service: Ophthalmology;  Laterality: Right;  [place] 00:50.5 CDE 7.01 Fluid Pack Lot # [id number]   COLONOSCOPY     COLONOSCOPY WITH PROPOFOL N/A 02/23/2017   Procedure: COLONOSCOPY WITH PROPOFOL;  Surgeon: Lollie Sails, MD;  Location: [facility];  Service: Endoscopy;  Laterality: N/A;   CORONARY ANGIOPLASTY     CORONARY ARTERY BYPASS GRAFT     ELECTROPHYSIOLOGIC STUDY N/A 01/30/2015   Procedure: Cardioversion;  Surgeon: Yolonda Kida, MD;  Location: [facility];  Service: Cardiovascular;  Laterality: N/A;   FRACTURE SURGERY Left    arm, knee, collar bone   FRACTURE SURGERY     GASTRIC RESECTION     partial   open heart surgery  12/05/2014    Social History   Tobacco Use   Smoking status: Former Smoker    Last attempt to quit: 11/07/1984    Years since quitting: 33.7   Smokeless tobacco: Never Used  Substance Use Topics   Alcohol use: Yes    Comment: less than 1 drink/week   Drug use: No     Medication list has been reviewed and updated.  Current Meds  Medication Sig   amLODipine (NORVASC) 5 MG tablet TAKE 1 TABLET BY MOUTH EVERY DAY   aspirin EC 81 MG tablet Take 1 tablet (81 mg total) by mouth daily.   atorvastatin (LIPITOR)  80 MG tablet TAKE 1 TABLET BY MOUTH EVERY DAY (Patient taking differently: Take 40 mg by mouth daily. )   metoprolol succinate (TOPROL-XL) 25 MG 24 hr tablet TAKE 1 TABLET (25 MG TOTAL) BY MOUTH AT BEDTIME   tamsulosin (FLOMAX) 0.4 MG CAPS capsule TAKE 1 CAPSULE BY MOUTH EVERY DAY    PHQ 2/9 Scores 07/21/2018 02/04/2018 12/31/2017 09/11/2016  PHQ - 2 Score 0 0 0 0  PHQ- 9 Score 0 0 - -    BP Readings from Last 3 Encounters:  07/21/18 120/80  06/01/18 116/64  02/12/18 135/80    Physical Exam Vitals signs and nursing note reviewed.  HENT:     Head: Normocephalic.     Right Ear: Tympanic membrane, ear canal and external ear normal.     Left Ear: Tympanic membrane, ear canal and external ear normal.     Nose: Nose normal. No congestion or rhinorrhea.     Mouth/Throat:     Mouth: Mucous membranes are moist.  Eyes:     General: No scleral icterus.       Right eye: No discharge.        Left eye: No discharge.     Conjunctiva/sclera: Conjunctivae normal.     Pupils: Pupils are equal, round, and reactive to light.  Neck:     Musculoskeletal: Normal range of motion and neck supple. No neck rigidity or muscular tenderness.     Thyroid: No thyromegaly.     Vascular: No carotid bruit or JVD.     Trachea: No tracheal deviation.  Cardiovascular:     Rate and Rhythm:  Normal rate and regular rhythm.     Heart sounds: Normal heart sounds. No murmur. No friction rub. No gallop.   Pulmonary:     Effort: No respiratory distress.     Breath sounds: Normal breath sounds. No stridor. No wheezing, rhonchi or rales.  Chest:     Chest wall: No tenderness.  Abdominal:     General: Bowel sounds are normal.     Palpations: Abdomen is soft. There is no mass.     Tenderness: There is no abdominal tenderness. There is no guarding or rebound.  Musculoskeletal: Normal range of motion.        General: No tenderness, deformity or signs of injury.  Lymphadenopathy:     Cervical: No cervical adenopathy.    Skin:    General: Skin is warm.     Findings: No rash.  Neurological:     Mental Status: He is alert and oriented to person, place, and time.     Cranial Nerves: No cranial nerve deficit.     Deep Tendon Reflexes: Reflexes are normal and symmetric.     Wt Readings from Last 3 Encounters:  07/21/18 167 lb (75.8 kg)  06/01/18 170 lb (77.1 kg)  02/12/18 162 lb (73.5 kg)    BP 120/80    Pulse 76    Ht 5\' 11"  (1.803 m)    Wt 167 lb (75.8 kg)    BMI 23.29 kg/m   Assessment and Plan:  1. Essential hypertension .  Controlled.  Continue metoprolol XL 25 mg once a day and and amlodipine 5 mg once a day.  Will check renal function panel. - metoprolol succinate (TOPROL-XL) 25 MG 24 hr tablet; TAKE 1 TABLET (25 MG TOTAL) BY MOUTH AT BEDTIME  Dispense: 90 tablet; Refill: 1 - amLODipine (NORVASC) 5 MG tablet; Take 1 tablet (5 mg total) by mouth daily.  Dispense: 90 tablet; Refill: 1 - Renal Function Panel  2. Benign prostatic hyperplasia with lower urinary tract symptoms, symptom details unspecified Patient with history of BPH symptomatically controlled on tamsulosin 0.4 mg once a day will continue this as well as check a PSA today - tamsulosin (FLOMAX) 0.4 MG CAPS capsule; Take 1 capsule (0.4 mg total) by mouth daily.  Dispense: 90 capsule; Refill: 1 - PSA  3. Mixed hyperlipidemia Chronic controlled.  Continue atorvastatin 40 mg once a day will check lipid panel.  This was decreased from 80 to 40 mg because of some elevation of LFTs see below. - atorvastatin (LIPITOR) 40 MG tablet; Take 1 tablet (40 mg total) by mouth daily.  Dispense: 90 tablet; Refill: 1 - Lipid panel  4. Taking medication for chronic disease Because patient had elevation of transaminases patient was reduced on dosage of his Lipitor and we are checking hepatic function panel at this time. - Hepatic Function Panel (6)

## 2018-07-22 LAB — RENAL FUNCTION PANEL
Albumin: 4.5 g/dL (ref 3.7–4.7)
BUN/Creatinine Ratio: 15 (ref 10–24)
BUN: 16 mg/dL (ref 8–27)
CO2: 20 mmol/L (ref 20–29)
Calcium: 9.3 mg/dL (ref 8.6–10.2)
Chloride: 103 mmol/L (ref 96–106)
Creatinine, Ser: 1.05 mg/dL (ref 0.76–1.27)
GFR calc Af Amer: 79 mL/min/{1.73_m2} (ref >59)
GFR calc non Af Amer: 68 mL/min/{1.73_m2} (ref >59)
Glucose: 90 mg/dL (ref 65–99)
Phosphorus: 3 mg/dL (ref 2.8–4.1)
Potassium: 4.8 mmol/L (ref 3.5–5.2)
Sodium: 141 mmol/L (ref 134–144)

## 2018-07-22 LAB — HEPATIC FUNCTION PANEL (6)
ALT: 40 IU/L (ref 0–44)
AST: 36 IU/L (ref 0–40)
Alkaline Phosphatase: 97 IU/L (ref 39–117)
Bilirubin Total: 0.6 mg/dL (ref 0.0–1.2)
Bilirubin, Direct: 0.19 mg/dL (ref 0.00–0.40)

## 2018-07-22 LAB — LIPID PANEL
Chol/HDL Ratio: 3 ratio (ref 0.0–5.0)
Cholesterol, Total: 87 mg/dL — ABNORMAL LOW (ref 100–199)
HDL: 29 mg/dL — ABNORMAL LOW (ref >39)
LDL Calculated: 34 mg/dL (ref 0–99)
Triglycerides: 122 mg/dL (ref 0–149)
VLDL Cholesterol Cal: 24 mg/dL (ref 5–40)

## 2018-07-22 LAB — PSA: Prostate Specific Ag, Serum: 1.2 ng/mL (ref 0.0–4.0)

## 2018-08-03 DIAGNOSIS — H2512 Age-related nuclear cataract, left eye: Secondary | ICD-10-CM | POA: Diagnosis not present

## 2018-08-04 ENCOUNTER — Other Ambulatory Visit: Payer: Self-pay

## 2018-08-04 ENCOUNTER — Encounter: Payer: Self-pay | Admitting: *Deleted

## 2018-08-06 ENCOUNTER — Other Ambulatory Visit: Payer: Self-pay

## 2018-08-06 ENCOUNTER — Other Ambulatory Visit
Admission: RE | Admit: 2018-08-06 | Discharge: 2018-08-06 | Disposition: A | Discharge status: Home / Self Care | Payer: Medicare HMO | Source: Ambulatory Visit | Attending: Ophthalmology | Admitting: Ophthalmology

## 2018-08-06 DIAGNOSIS — Z1159 Encounter for screening for other viral diseases: Secondary | ICD-10-CM | POA: Insufficient documentation

## 2018-08-06 NOTE — Discharge Instructions (Signed)
General Anesthesia, Adult General anesthesia is the use of medicines to make a person "go to sleep" (unconscious) for a medical procedure. General anesthesia must be used for certain procedures, and is often recommended for procedures that:  Last a long time.  Require you to be still or in an unusual position.  Are major and can cause blood loss. The medicines used for general anesthesia are called general anesthetics. As well as making you unconscious for a certain amount of time, these medicines:  Prevent pain.  Control your blood pressure.  Relax your muscles. Tell a health care provider about:  Any allergies you have.  All medicines you are taking, including vitamins, herbs, eye drops, creams, and over-the-counter medicines.  Any problems you or family members have had with anesthetic medicines.  Types of anesthetics you have had in the past.  Any blood disorders you have.  Any surgeries you have had.  Any medical conditions you have.  Any recent upper respiratory, chest, or ear infections.  Any history of: ? Heart or lung conditions, such as heart failure, sleep apnea, asthma, or chronic obstructive pulmonary disease (COPD). ? Armed forces logistics/support/administrative officer. ? Depression or anxiety.  Any tobacco or drug use, including marijuana or alcohol use.  Whether you are pregnant or may be pregnant. What are the risks? Generally, this is a safe procedure. However, problems may occur, including:  Allergic reaction.  Lung and heart problems.  Inhaling food or liquid from the stomach into the lungs (aspiration).  Nerve injury.  Dental injury.  Air in the bloodstream, which can lead to stroke.  Extreme agitation or confusion (delirium) when you wake up from the anesthetic.  Waking up during your procedure and being unable to move. This is rare. These problems are more likely to develop if you are having a major surgery or if you have an advanced or serious medical condition. You  can prevent some of these complications by answering all of your health care provider's questions thoroughly and by following all instructions before your procedure. General anesthesia can cause side effects, including:  Nausea or vomiting.  A sore throat from the breathing tube.  Hoarseness.  Wheezing or coughing.  Shaking chills.  Tiredness.  Body aches.  Anxiety.  Sleepiness or drowsiness.  Confusion or agitation. What happens before the procedure? Staying hydrated Follow instructions from your health care provider about hydration, which may include:  Up to 2 hours before the procedure - you may continue to drink clear liquids, such as water, clear fruit juice, black coffee, and plain tea.  Eating and drinking restrictions Follow instructions from your health care provider about eating and drinking, which may include:  8 hours before the procedure - stop eating heavy meals or foods such as meat, fried foods, or fatty foods.  6 hours before the procedure - stop eating light meals or foods, such as toast or cereal.  6 hours before the procedure - stop drinking milk or drinks that contain milk.  2 hours before the procedure - stop drinking clear liquids. Medicines Ask your health care provider about:  Changing or stopping your regular medicines. This is especially important if you are taking diabetes medicines or blood thinners.  Taking medicines such as aspirin and ibuprofen. These medicines can thin your blood. Do not take these medicines unless your health care provider tells you to take them.  Taking over-the-counter medicines, vitamins, herbs, and supplements. Do not take these during the week before your procedure unless your health care  provider approves them. General instructions  Starting 3-6 weeks before the procedure, do not use any products that contain nicotine or tobacco, such as cigarettes and e-cigarettes. If you need help quitting, ask your health care  provider.  If you brush your teeth on the morning of the procedure, make sure to spit out all of the toothpaste.  Tell your health care provider if you become ill or develop a cold, cough, or fever.  If instructed by your health care provider, bring your sleep apnea device with you on the day of your surgery (if applicable).  Ask your health care provider if you will be going home the same day, the following day, or after a longer hospital stay. ? Plan to have someone take you home from the hospital or clinic. ? Plan to have a responsible adult care for you for at least 24 hours after you leave the hospital or clinic. This is important. What happens during the procedure?   You will be given anesthetics through both of the following: ? A mask placed over your nose and mouth. ? An IV in one of your veins.  You may receive a medicine to help you relax (sedative).  After you are unconscious, a breathing tube may be inserted down your throat to help you breathe. This will be removed before you wake up.  An anesthesia specialist will stay with you throughout your procedure. He or she will: ? Keep you comfortable and safe by continuing to give you medicines and adjusting the amount of medicine that you get. ? Monitor your blood pressure, pulse, and oxygen levels to make sure that the anesthetics do not cause any problems. The procedure may vary among health care providers and hospitals. What happens after the procedure?  Your blood pressure, temperature, heart rate, breathing rate, and blood oxygen level will be monitored until the medicines you were given have worn off.  You will wake up in a recovery area. You may wake up slowly.  If you feel anxious or agitated, you may be given medicine to help you calm down.  If you will be going home the same day, your health care provider may check to make sure you can walk, drink, and urinate.  Your health care provider will treat any pain or  side effects you have before you go home.  Do not drive for 24 hours if you were given a sedative. Summary  General anesthesia is used to keep you still and prevent pain during a procedure.  It is important to tell your health care provider about your medical history and any surgeries you have had, and previous experience with anesthesia.  Follow your health care provider's instructions about when to stop eating, drinking, or taking certain medicines before your procedure.  Plan to have someone take you home from the hospital or clinic. This information is not intended to replace advice given to you by your health care provider. Make sure you discuss any questions you have with your health care provider. Document Released: 06/17/2007 Document Revised: 07/28/2017 Document Reviewed: 10/24/2016 Elsevier Interactive Patient Education  2019 [facility], Care After This sheet gives you information about how to care for yourself after your procedure. Your health care provider may also give you more specific instructions. If you have problems or questions, contact your health care provider. What can I expect after the procedure? After the procedure, it is common to have:  Itching.  Discomfort.  Fluid discharge.  Sensitivity  to light and to touch.  Bruising in or around the eye.  Mild blurred vision. Follow these instructions at home: Eye care   Do not touch or rub your eyes.  Protect your eyes as told by your health care provider. You may be told to wear a protective eye shield or sunglasses.  Do not put a contact lens into the affected eye or eyes until your health care provider approves.  Keep the area around your eye clean and dry: ? Avoid swimming. ? Do not allow water to hit you directly in the face while showering. ? Keep soap and shampoo out of your eyes.  Check your eye every day for signs of infection. Watch for: ? Redness, swelling, or  pain. ? Fluid, blood, or pus. ? Warmth. ? A bad smell. ? Vision that is getting worse. ? Sensitivity that is getting worse. Activity  Do not drive for 24 hours if you were given a sedative during your procedure.  Avoid strenuous activities, such as playing contact sports, for as long as told by your health care provider.  Do not drive or use heavy machinery until your health care provider approves.  Do not bend or lift heavy objects. Bending increases pressure in the eye. You can walk, climb stairs, and do light household chores.  Ask your health care provider when you can return to work. If you work in a dusty environment, you may be advised to wear protective eyewear for a period of time. General instructions  Take or apply over-the-counter and prescription medicines only as told by your health care provider. This includes eye drops.  Keep all follow-up visits as told by your health care provider. This is important. Contact a health care provider if:  You have increased bruising around your eye.  You have pain that is not helped with medicine.  You have a fever.  You have redness, swelling, or pain in your eye.  You have fluid, blood, or pus coming from your incision.  Your vision gets worse.  Your sensitivity to light gets worse. Get help right away if:  You have sudden loss of vision.  You see flashes of light or spots (floaters).  You have severe eye pain.  You develop nausea or vomiting. Summary  After your procedure, it is common to have itching, discomfort, bruising, fluid discharge, or sensitivity to light.  Follow instructions from your health care provider about caring for your eye after the procedure.  Do not rub your eye after the procedure. You may need to wear eye protection or sunglasses. Do not wear contact lenses. Keep the area around your eye clean and dry.  Avoid activities that require a lot of effort. These include playing sports and lifting  heavy objects.  Contact a health care provider if you have increased bruising, pain that does not go away, or a fever. Get help right away if you suddenly lose your vision, see flashes of light or spots, or have severe pain in the eye. This information is not intended to replace advice given to you by your health care provider. Make sure you discuss any questions you have with your health care provider. Document Released: 09/27/2004 Document Revised: 09/07/2017 Document Reviewed: 09/07/2017 Elsevier Interactive Patient Education  2019 [employer].

## 2018-08-08 LAB — NOVEL CORONAVIRUS, NAA (HOSP ORDER, SEND-OUT TO REF LAB; TAT 18-24 HRS): SARS-CoV-2, NAA: NOT DETECTED

## 2018-08-09 NOTE — Anesthesia Preprocedure Evaluation (Addendum)
Anesthesia Evaluation  Patient identified by MRN, date of birth, ID band Patient awake    Reviewed: Allergy & Precautions, NPO status , Patient's Chart, lab work & pertinent test results  History of Anesthesia Complications Negative for: history of anesthetic complications  Airway Mallampati: III   Neck ROM: Full    Dental  (+)    Pulmonary former smoker (quit 1986),    Pulmonary exam normal breath sounds clear to auscultation       Cardiovascular hypertension, + CAD (s/p CABG)  Normal cardiovascular exam+ dysrhythmias (a fib)  Rhythm:Regular Rate:Normal     Neuro/Psych negative neurological ROS     GI/Hepatic PUD,   Endo/Other  negative endocrine ROS  Renal/GU negative Renal ROS     Musculoskeletal Gout    Abdominal   Peds  Hematology  (+) Blood dyscrasia, anemia ,   Anesthesia Other Findings   Reproductive/Obstetrics                            Anesthesia Physical Anesthesia Plan  ASA: III  Anesthesia Plan: MAC   Post-op Pain Management:    Induction: Intravenous  PONV Risk Score and Plan: 1 and TIVA and Midazolam  Airway Management Planned: Natural Airway  Additional Equipment:   Intra-op Plan:   Post-operative Plan:   Informed Consent: I have reviewed the patients History and Physical, chart, labs and discussed the procedure including the risks, benefits and alternatives for the proposed anesthesia with the patient or authorized representative who has indicated his/her understanding and acceptance.       Plan Discussed with: CRNA  Anesthesia Plan Comments:        Anesthesia Quick Evaluation

## 2018-08-10 ENCOUNTER — Ambulatory Visit: Payer: Medicare HMO | Admitting: Anesthesiology

## 2018-08-10 ENCOUNTER — Ambulatory Visit
Admission: RE | Admit: 2018-08-10 | Discharge: 2018-08-10 | Disposition: A | Discharge status: Home / Self Care | Payer: Medicare HMO | Attending: Ophthalmology | Admitting: Ophthalmology

## 2018-08-10 ENCOUNTER — Encounter
Admission: RE | Disposition: A | Discharge status: Home / Self Care | Payer: Self-pay | Source: Home / Self Care | Attending: Ophthalmology

## 2018-08-10 ENCOUNTER — Other Ambulatory Visit: Payer: Self-pay

## 2018-08-10 DIAGNOSIS — E782 Mixed hyperlipidemia: Secondary | ICD-10-CM | POA: Insufficient documentation

## 2018-08-10 DIAGNOSIS — R351 Nocturia: Secondary | ICD-10-CM | POA: Insufficient documentation

## 2018-08-10 DIAGNOSIS — H2512 Age-related nuclear cataract, left eye: Secondary | ICD-10-CM | POA: Diagnosis not present

## 2018-08-10 DIAGNOSIS — Z951 Presence of aortocoronary bypass graft: Secondary | ICD-10-CM | POA: Insufficient documentation

## 2018-08-10 DIAGNOSIS — N401 Enlarged prostate with lower urinary tract symptoms: Secondary | ICD-10-CM | POA: Insufficient documentation

## 2018-08-10 DIAGNOSIS — I10 Essential (primary) hypertension: Secondary | ICD-10-CM | POA: Insufficient documentation

## 2018-08-10 DIAGNOSIS — Z87891 Personal history of nicotine dependence: Secondary | ICD-10-CM | POA: Insufficient documentation

## 2018-08-10 DIAGNOSIS — I25810 Atherosclerosis of coronary artery bypass graft(s) without angina pectoris: Secondary | ICD-10-CM | POA: Diagnosis not present

## 2018-08-10 DIAGNOSIS — H25812 Combined forms of age-related cataract, left eye: Secondary | ICD-10-CM | POA: Diagnosis not present

## 2018-08-10 HISTORY — PX: CATARACT EXTRACTION W/PHACO: SHX586

## 2018-08-10 SURGERY — PHACOEMULSIFICATION, CATARACT, WITH IOL INSERTION
Anesthesia: Monitor Anesthesia Care | Site: Eye | Laterality: Left

## 2018-08-10 MED ORDER — TETRACAINE HCL 0.5 % OP SOLN
1.0000 [drp] | OPHTHALMIC | Status: DC | PRN
  Administered 2018-08-10 (×3): 1 [drp] via OPHTHALMIC

## 2018-08-10 MED ORDER — MOXIFLOXACIN HCL 0.5 % OP SOLN
OPHTHALMIC | Status: DC | PRN
  Administered 2018-08-10: 0.2 mL via OPHTHALMIC

## 2018-08-10 MED ORDER — LACTATED RINGERS IV SOLN: INTRAVENOUS | Status: DC

## 2018-08-10 MED ORDER — NA CHONDROIT SULF-NA HYALURON 40-17 MG/ML IO SOLN
INTRAOCULAR | Status: DC | PRN
  Administered 2018-08-10: 1 mL via INTRAOCULAR

## 2018-08-10 MED ORDER — BRIMONIDINE TARTRATE-TIMOLOL 0.2-0.5 % OP SOLN
OPHTHALMIC | Status: DC | PRN
  Administered 2018-08-10: 1 [drp] via OPHTHALMIC

## 2018-08-10 MED ORDER — MIDAZOLAM HCL 2 MG/2ML IJ SOLN
INTRAMUSCULAR | Status: DC | PRN
  Administered 2018-08-10: 2 mg via INTRAVENOUS

## 2018-08-10 MED ORDER — LIDOCAINE HCL (PF) 2 % IJ SOLN
INTRAOCULAR | Status: DC | PRN
  Administered 2018-08-10: 1 mL

## 2018-08-10 MED ORDER — ARMC OPHTHALMIC DILATING DROPS
1.0000 "application " | OPHTHALMIC | Status: DC | PRN
  Administered 2018-08-10 (×3): 1 via OPHTHALMIC

## 2018-08-10 MED ORDER — EPINEPHRINE PF 1 MG/ML IJ SOLN
INTRAOCULAR | Status: DC | PRN
  Administered 2018-08-10: 100 mL via OPHTHALMIC

## 2018-08-10 MED ORDER — FENTANYL CITRATE (PF) 100 MCG/2ML IJ SOLN
INTRAMUSCULAR | Status: DC | PRN
  Administered 2018-08-10: 50 ug via INTRAVENOUS

## 2018-08-10 SURGICAL SUPPLY — 20 items
CANNULA ANT/CHMB 27G (MISCELLANEOUS) ×1 IMPLANT
CANNULA ANT/CHMB 27GA (MISCELLANEOUS) ×2 IMPLANT
GLOVE SURG LX 8.0 MICRO (GLOVE) ×1
GLOVE SURG LX STRL 8.0 MICRO (GLOVE) ×1 IMPLANT
GLOVE SURG TRIUMPH 8.0 PF LTX (GLOVE) ×2 IMPLANT
GOWN STRL REUS W/ TWL LRG LVL3 (GOWN DISPOSABLE) ×2 IMPLANT
GOWN STRL REUS W/TWL LRG LVL3 (GOWN DISPOSABLE) ×2
LENS IOL TECNIS ITEC 26.0 (Intraocular Lens) ×1 IMPLANT
MARKER SKIN DUAL TIP RULER LAB (MISCELLANEOUS) ×2 IMPLANT
NDL FILTER BLUNT 18X1 1/2 (NEEDLE) ×1 IMPLANT
NDL RETROBULBAR .5 NSTRL (NEEDLE) ×2 IMPLANT
NEEDLE FILTER BLUNT 18X 1/2SAF (NEEDLE) ×1
NEEDLE FILTER BLUNT 18X1 1/2 (NEEDLE) ×1 IMPLANT
PACK CATARACT BRASINGTON (MISCELLANEOUS) ×1 IMPLANT
PACK EYE AFTER SURG (MISCELLANEOUS) ×2 IMPLANT
PACK OPTHALMIC (MISCELLANEOUS) ×2 IMPLANT
RING MALYGIN (MISCELLANEOUS) ×1 IMPLANT
SYR 3ML LL SCALE MARK (SYRINGE) ×2 IMPLANT
SYR TB 1ML LUER SLIP (SYRINGE) ×2 IMPLANT
WIPE NON LINTING 3.25X3.25 (MISCELLANEOUS) ×2 IMPLANT

## 2018-08-10 NOTE — Op Note (Signed)
Viscoelastic was used to raise the pupil margin.  A  Malyugin ring was placed as the pupil would not achieve sufficient pharmacologic dilation to undergo cataract extraction safely.( The ring was removed atraumatically following insertion of the IOL.)

## 2018-08-10 NOTE — Op Note (Signed)
PREOPERATIVE DIAGNOSIS:  Nuclear sclerotic cataract of the left eye.   POSTOPERATIVE DIAGNOSIS:  Nuclear sclerotic cataract of the left eye.   OPERATIVE PROCEDURE: Procedure(s): CATARACT EXTRACTION PHACO AND INTRAOCULAR LENS PLACEMENT ([place])  COMPLICATED LEFT MALYUGIN   SURGEON:  Birder Robson, MD.   ANESTHESIA:  Anesthesiologist: Darrin Nipper, MD CRNA: Janna Arch, CRNA  1.      Managed anesthesia care. 2.     0.47ml of Shugarcaine was instilled following the paracentesis   COMPLICATIONS:  None.   TECHNIQUE:   Stop and chop   DESCRIPTION OF PROCEDURE:  The patient was examined and consented in the preoperative holding area where the aforementioned topical anesthesia was applied to the left eye and then brought back to the Operating Room where the left eye was prepped and draped in the usual sterile ophthalmic fashion and a lid speculum was placed. A paracentesis was created with the side port blade and the anterior chamber was filled with viscoelastic. A near clear corneal incision was performed with the steel keratome. A continuous curvilinear capsulorrhexis was performed with a cystotome followed by the capsulorrhexis forceps. Hydrodissection and hydrodelineation were carried out with BSS on a blunt cannula. The lens was removed in a stop and chop  technique and the remaining cortical material was removed with the irrigation-aspiration handpiece. The capsular bag was inflated with viscoelastic and the Technis [id number] lens was placed in the capsular bag without complication. The remaining viscoelastic was removed from the eye with the irrigation-aspiration handpiece. The wounds were hydrated. The anterior chamber was flushed with Miostat and the eye was inflated to physiologic pressure. 0.42ml Vigamox was placed in the anterior chamber. The wounds were found to be water tight. The eye was dressed with Vigamox. The patient was given protective glasses to wear throughout the day and a  shield with which to sleep tonight. The patient was also given drops with which to begin a drop regimen today and will follow-up with me in one day. Implant Name Type Inv. Item Serial No. Manufacturer Lot No. LRB No. Used  LENS IOL DIOP 26.0 - [id number] Intraocular Lens LENS IOL DIOP 26.0 [id number] AMO  Left 1    Procedure(s) with comments: CATARACT EXTRACTION PHACO AND INTRAOCULAR LENS PLACEMENT ([place])  COMPLICATED LEFT MALYUGIN (Left) - MALYUGIN  Electronically signed: Birder Robson 08/10/2018 11:49 AM

## 2018-08-10 NOTE — H&P (Signed)
All labs reviewed. Abnormal studies sent to patients PCP when indicated.  Previous H&P reviewed, patient examined, there are NO CHANGES.  Stuart Gear Porfilio5/19/202011:04 AM

## 2018-08-10 NOTE — Anesthesia Procedure Notes (Signed)
Procedure Name: MAC Date/Time: 08/10/2018 11:10 AM Performed by: Janna Arch, CRNA Pre-anesthesia Checklist: Patient identified, Emergency Drugs available, Suction available, Timeout performed and Patient being monitored Patient Re-evaluated:Patient Re-evaluated prior to induction Oxygen Delivery Method: Nasal cannula Placement Confirmation: positive ETCO2

## 2018-08-10 NOTE — Transfer of Care (Signed)
Immediate Anesthesia Transfer of Care Note  Patient: Stuart Jenkins [facility]  Procedure(s) Performed: CATARACT EXTRACTION PHACO AND INTRAOCULAR LENS PLACEMENT (I[facility])  COMPLICATED LEFT MALYUGIN (Left Eye)  Patient Location: PACU  Anesthesia Type: MAC  Level of Consciousness: awake, alert  and patient cooperative  Airway and Oxygen Therapy: Patient Spontanous Breathing and Patient connected to supplemental oxygen  Post-op Assessment: Post-op Vital signs reviewed, Patient's Cardiovascular Status Stable, Respiratory Function Stable, Patent Airway and No signs of Nausea or vomiting  Post-op Vital Signs: Reviewed and stable  Complications: No apparent anesthesia complications

## 2018-08-10 NOTE — Anesthesia Postprocedure Evaluation (Signed)
Anesthesia Post Note  Patient: Stuart Jenkins  Procedure(s) Performed: CATARACT EXTRACTION PHACO AND INTRAOCULAR LENS PLACEMENT ([place])  COMPLICATED LEFT MALYUGIN (Left Eye)  Patient location during evaluation: PACU Anesthesia Type: MAC Level of consciousness: awake and alert, oriented and patient cooperative Pain management: pain level controlled Vital Signs Assessment: post-procedure vital signs reviewed and stable Respiratory status: spontaneous breathing, nonlabored ventilation and respiratory function stable Cardiovascular status: blood pressure returned to baseline and stable Postop Assessment: adequate PO intake Anesthetic complications: no    Darrin Nipper

## 2018-08-11 ENCOUNTER — Encounter: Payer: Self-pay | Admitting: Ophthalmology

## 2018-09-23 ENCOUNTER — Other Ambulatory Visit: Payer: Self-pay | Admitting: Family Medicine

## 2018-09-23 DIAGNOSIS — E782 Mixed hyperlipidemia: Secondary | ICD-10-CM

## 2018-11-15 DIAGNOSIS — R69 Illness, unspecified: Secondary | ICD-10-CM | POA: Diagnosis not present

## 2018-12-07 DIAGNOSIS — R69 Illness, unspecified: Secondary | ICD-10-CM | POA: Diagnosis not present

## 2018-12-07 DIAGNOSIS — E785 Hyperlipidemia, unspecified: Secondary | ICD-10-CM | POA: Diagnosis not present

## 2018-12-07 DIAGNOSIS — M159 Polyosteoarthritis, unspecified: Secondary | ICD-10-CM | POA: Diagnosis not present

## 2018-12-07 DIAGNOSIS — K219 Gastro-esophageal reflux disease without esophagitis: Secondary | ICD-10-CM | POA: Diagnosis not present

## 2018-12-07 DIAGNOSIS — I10 Essential (primary) hypertension: Secondary | ICD-10-CM | POA: Diagnosis not present

## 2018-12-07 DIAGNOSIS — M180 Bilateral primary osteoarthritis of first carpometacarpal joints: Secondary | ICD-10-CM | POA: Diagnosis not present

## 2018-12-07 DIAGNOSIS — Z951 Presence of aortocoronary bypass graft: Secondary | ICD-10-CM | POA: Diagnosis not present

## 2018-12-07 DIAGNOSIS — I2510 Atherosclerotic heart disease of native coronary artery without angina pectoris: Secondary | ICD-10-CM | POA: Diagnosis not present

## 2019-01-14 ENCOUNTER — Other Ambulatory Visit: Payer: Self-pay | Admitting: Family Medicine

## 2019-01-14 DIAGNOSIS — I10 Essential (primary) hypertension: Secondary | ICD-10-CM

## 2019-01-16 ENCOUNTER — Other Ambulatory Visit: Payer: Self-pay | Admitting: Family Medicine

## 2019-01-16 DIAGNOSIS — I10 Essential (primary) hypertension: Secondary | ICD-10-CM

## 2019-01-16 DIAGNOSIS — E782 Mixed hyperlipidemia: Secondary | ICD-10-CM

## 2019-01-31 ENCOUNTER — Encounter: Payer: Self-pay | Admitting: Family Medicine

## 2019-01-31 ENCOUNTER — Ambulatory Visit (INDEPENDENT_AMBULATORY_CARE_PROVIDER_SITE_OTHER): Payer: Medicare HMO | Admitting: Family Medicine

## 2019-01-31 ENCOUNTER — Other Ambulatory Visit: Payer: Self-pay

## 2019-01-31 VITALS — BP 100/60 | HR 60 | Ht 71.0 in | Wt 160.0 lb

## 2019-01-31 DIAGNOSIS — E782 Mixed hyperlipidemia: Secondary | ICD-10-CM | POA: Diagnosis not present

## 2019-01-31 DIAGNOSIS — F5102 Adjustment insomnia: Secondary | ICD-10-CM | POA: Diagnosis not present

## 2019-01-31 DIAGNOSIS — I10 Essential (primary) hypertension: Secondary | ICD-10-CM | POA: Diagnosis not present

## 2019-01-31 DIAGNOSIS — R69 Illness, unspecified: Secondary | ICD-10-CM | POA: Diagnosis not present

## 2019-01-31 DIAGNOSIS — N401 Enlarged prostate with lower urinary tract symptoms: Secondary | ICD-10-CM

## 2019-01-31 DIAGNOSIS — Z23 Encounter for immunization: Secondary | ICD-10-CM | POA: Diagnosis not present

## 2019-01-31 MED ORDER — TAMSULOSIN HCL 0.4 MG PO CAPS: 0.4000 mg | ORAL_CAPSULE | Freq: Every day | ORAL | 1 refills | Status: DC

## 2019-01-31 MED ORDER — AMLODIPINE BESYLATE 5 MG PO TABS: 5.0000 mg | ORAL_TABLET | Freq: Every day | ORAL | 1 refills | Status: DC

## 2019-01-31 MED ORDER — ATORVASTATIN CALCIUM 40 MG PO TABS: 40.0000 mg | ORAL_TABLET | Freq: Every day | ORAL | 1 refills | Status: DC

## 2019-01-31 MED ORDER — METOPROLOL SUCCINATE ER 25 MG PO TB24: ORAL_TABLET | ORAL | 0 refills | Status: DC

## 2019-01-31 MED ORDER — TRAZODONE HCL 50 MG PO TABS: 25.0000 mg | ORAL_TABLET | Freq: Every evening | ORAL | 3 refills | Status: DC | PRN

## 2019-01-31 NOTE — Progress Notes (Signed)
Date:  01/31/2019   Name:  Stuart Jenkins   DOB:  1940/11/24   MRN:  [id number]   Chief Complaint: Hypertension, Hyperlipidemia, Benign Prostatic Hypertrophy, pneum vacc need (23), and Insomnia (worrying about wife)  Hypertension This is a chronic problem. The current episode started more than 1 year ago. The problem has been waxing and waning since onset. The problem is controlled. Associated symptoms include anxiety. Pertinent negatives include no blurred vision, chest pain, headaches, malaise/fatigue, neck pain, orthopnea, palpitations, peripheral edema, PND, shortness of breath or sweats. There are no associated agents to hypertension. Risk factors for coronary artery disease include dyslipidemia and male gender. Past treatments include beta blockers and calcium channel blockers. The current treatment provides moderate improvement. There are no compliance problems.  There is no history of angina, kidney disease, CAD/MI, CVA, heart failure, left ventricular hypertrophy, PVD or retinopathy. There is no history of chronic renal disease, a hypertension causing med or renovascular disease.  Hyperlipidemia This is a chronic problem. The problem is controlled. Recent lipid tests were reviewed and are normal. He has no history of chronic renal disease, diabetes, hypothyroidism, liver disease, obesity or nephrotic syndrome. Pertinent negatives include no chest pain, myalgias or shortness of breath. The current treatment provides moderate improvement of lipids. There are no compliance problems.  Risk factors for coronary artery disease include hypertension and dyslipidemia.  Benign Prostatic Hypertrophy This is a chronic problem. The current episode started more than 1 year ago. The problem has been waxing and waning since onset. Irritative symptoms include nocturia. Irritative symptoms do not include frequency or urgency. Obstructive symptoms do not include dribbling, incomplete emptying, an  intermittent stream, a slower stream, straining or a weak stream. Pertinent negatives include no chills, dysuria, hematuria, hesitancy, nausea or vomiting. Past treatments include tamsulosin. The treatment provided moderate relief.  Insomnia Primary symptoms: no fragmented sleep, no sleep disturbance, no difficulty falling asleep, no somnolence, no frequent awakening, no premature morning awakening, no malaise/fatigue.  The problem occurs intermittently. PMH includes: no hypertension, no depression, family stress or anxiety.    @ Lab Results  Component Value Date   CREATININE 1.05 07/21/2018   BUN 16 07/21/2018   NA 141 07/21/2018   K 4.8 07/21/2018   CL 103 07/21/2018   CO2 20 07/21/2018   @ Lab Results  Component Value Date   CHOL 87 (L) 07/21/2018   HDL 29 (L) 07/21/2018   LDLCALC 34 07/21/2018   TRIG 122 07/21/2018   CHOLHDL 3.0 07/21/2018   @No  results found for: TSH @ Lab Results  Component Value Date   HGBA1C 5.5 12/24/2014     Review of Systems  Constitutional: Negative for chills, fever and malaise/fatigue.  HENT: Negative for drooling, ear discharge, ear pain and sore throat.   Eyes: Negative for blurred vision.  Respiratory: Negative for cough, shortness of breath and wheezing.   Cardiovascular: Negative for chest pain, palpitations, orthopnea, leg swelling and PND.  Gastrointestinal: Negative for abdominal pain, blood in stool, constipation, diarrhea, nausea and vomiting.  Endocrine: Negative for polydipsia.  Genitourinary: Positive for nocturia. Negative for dysuria, frequency, hematuria, hesitancy, incomplete emptying and urgency.  Musculoskeletal: Negative for back pain, myalgias and neck pain.  Skin: Negative for rash.  Allergic/Immunologic: Negative for environmental allergies.  Neurological: Negative for dizziness and headaches.  Hematological: Does not bruise/bleed easily.  Psychiatric/Behavioral: Negative for depression, sleep disturbance and  suicidal ideas. The patient has insomnia. The patient is not nervous/anxious.  Patient Active Problem List   Diagnosis Date Noted  . Pain in limb 02/12/2018  . Coronary artery disease involving coronary bypass graft of native heart without angina pectoris 07/30/2017  . Mixed hyperlipidemia 09/11/2016  . Benign prostatic hyperplasia with lower urinary tract symptoms 09/11/2016  . Essential hypertension 06/12/2015  . Cervical disc disorder 04/10/2015  . Hypotension 12/24/2014  . Hyponatremia 12/24/2014  . Anemia 12/24/2014  . Hyperglycemia 12/24/2014  . Syncopal episodes 12/23/2014    Allergies  Allergen Reactions  . Oxycodone Hcl Other (See Comments)    Altered mental status  . Sulfa Antibiotics Rash    Past Surgical History:  Procedure Laterality Date  . APPENDECTOMY    . CARDIAC CATHETERIZATION N/A 11/08/2014   Procedure: Left Heart Cath and Coronary Angiography;  Surgeon: Yolonda Kida, MD;  Location: [place] [facility];  Service: Cardiovascular;  Laterality: N/A;  . CARDIOVERSION    . CATARACT EXTRACTION W/PHACO Right 06/01/2018   Procedure: CATARACT EXTRACTION PHACO AND INTRAOCULAR LENS PLACEMENT ([place]) RIGHT;  Surgeon: Birder Robson, MD;  Location: [facility];  Service: Ophthalmology;  Laterality: Right;  [place] 00:50.5 CDE 7.01 Fluid Pack Lot # [id number]  . CATARACT EXTRACTION W/PHACO Left 08/10/2018   Procedure: CATARACT EXTRACTION PHACO AND INTRAOCULAR LENS PLACEMENT ([place])  COMPLICATED LEFT MALYUGIN;  Surgeon: Birder Robson, MD;  Location: [place];  Service: Ophthalmology;  Laterality: Left;  MALYUGIN  . COLONOSCOPY    . COLONOSCOPY WITH PROPOFOL N/A 02/23/2017   Procedure: COLONOSCOPY WITH PROPOFOL;  Surgeon: Lollie Sails, MD;  Location: [facility] END[facility]COPY;  Service: Endoscopy;  Laterality: N/A;  . CORONARY ANGIOPLASTY    . CORONARY ARTERY BYPASS GRAFT    . ELECTROPHYSIOLOGIC STUDY N/A 01/30/2015   Procedure: Cardioversion;  Surgeon: Yolonda Kida, MD;  Location: [facility] OR[facility];  Service: Cardiovascular;  Laterality: N/A;  . FRACTURE SURGERY Left    arm, knee, collar bone  . FRACTURE SURGERY    . GASTRIC RESECTION     partial  . open heart surgery  12/05/2014    Social History   Tobacco Use  . Smoking status: Former Smoker    Quit date: 11/07/1984    Years since quitting: 34.2  . Smokeless tobacco: Never Used  Substance Use Topics  . Alcohol use: Yes    Comment: less than 1 drink/week  . Drug use: No     Medication list has been reviewed and updated.  Current Meds  Medication Sig  . amLODipine (NORVASC) 5 MG tablet TAKE 1 TABLET BY MOUTH EVERY DAY  . aspirin EC 81 MG tablet Take 1 tablet (81 mg total) by mouth daily.  Marland Kitchen atorvastatin (LIPITOR) 40 MG tablet TAKE 1 TABLET BY MOUTH EVERY DAY  . metoprolol succinate (TOPROL-XL) 25 MG 24 hr tablet TAKE 1 TABLET BY MOUTH EVERYDAY AT BEDTIME  . tamsulosin (FLOMAX) 0.4 MG CAPS capsule Take 1 capsule (0.4 mg total) by mouth daily.    PHQ 2/9 Scores 01/31/2019 07/21/2018 02/04/2018 12/31/2017  PHQ - 2 Score 0 0 0 0  PHQ- 9 Score 2 0 0 -    BP Readings from Last 3 Encounters:  01/31/19 100/60  08/10/18 136/78  07/21/18 120/80    Physical Exam Vitals signs and nursing note reviewed.  HENT:     Head: Normocephalic.     Right Ear: Tympanic membrane, ear canal and external ear normal.     Left Ear: Tympanic membrane, ear canal and external ear normal.     Nose: Nose  normal. No congestion or rhinorrhea.     Mouth/Throat:     Mouth: Mucous membranes are moist.  Eyes:     General: No scleral icterus.       Right eye: No discharge.        Left eye: No discharge.     Conjunctiva/sclera: Conjunctivae normal.     Pupils: Pupils are equal, round, and reactive to light.  Neck:     Musculoskeletal: Normal range of motion and neck supple.     Thyroid: No thyromegaly.     Vascular: No carotid bruit or JVD.     Trachea: No tracheal deviation.  Cardiovascular:     Rate and  Rhythm: Normal rate and regular rhythm.     Heart sounds: Normal heart sounds. No murmur. No friction rub. No gallop.   Pulmonary:     Effort: No respiratory distress.     Breath sounds: Normal breath sounds. No wheezing or rales.  Abdominal:     General: Bowel sounds are normal.     Palpations: Abdomen is soft. There is no mass.     Tenderness: There is no abdominal tenderness. There is no guarding or rebound.  Genitourinary:    Prostate: Normal. Not enlarged, not tender and no nodules present.     Rectum: Normal.  Musculoskeletal: Normal range of motion.        General: No tenderness.  Lymphadenopathy:     Cervical: No cervical adenopathy.  Skin:    General: Skin is warm.     Findings: No rash.  Neurological:     Mental Status: He is alert and oriented to person, place, and time.     Cranial Nerves: No cranial nerve deficit.     Deep Tendon Reflexes: Reflexes are normal and symmetric.     Wt Readings from Last 3 Encounters:  01/31/19 160 lb (72.6 kg)  08/10/18 161 lb 6.4 oz (73.2 kg)  07/21/18 167 lb (75.8 kg)    BP 100/60   Pulse 60   Ht 5\' 11"  (1.803 m)   Wt 160 lb (72.6 kg)   BMI 22.32 kg/m   Assessment and Plan: 1. Benign prostatic hyperplasia with lower urinary tract symptoms, symptom details unspecified Patient with history of BPH with symptoms only of nocturia.  Review of previous PSAs have been acceptable we will continue tamsulosin 0.4. - tamsulosin (FLOMAX) 0.4 MG CAPS capsule; Take 1 capsule (0.4 mg total) by mouth daily.  Dispense: 90 capsule; Refill: 1  2. Essential hypertension Chronic.  Controlled.  Continue amlodipine 5 mg and metoprolol XL 25 mg once a day.  Will check renal function panel.  Will recheck in 6 months. - Renal Function Panel - amLODipine (NORVASC) 5 MG tablet; Take 1 tablet (5 mg total) by mouth daily.  Dispense: 90 tablet; Refill: 1 - metoprolol succinate (TOPROL-XL) 25 MG 24 hr tablet; TAKE 1 TABLET BY MOUTH EVERYDAY AT BEDTIME   Dispense: 30 tablet; Refill: 0  3. Mixed hyperlipidemia Chronic.  Controlled.  Continue atorvastatin 40 mg once a day.  We will check a lipid panel.  Patient was given a Mediterranean diet for consideration. - atorvastatin (LIPITOR) 40 MG tablet; Take 1 tablet (40 mg total) by mouth daily.  Dispense: 90 tablet; Refill: 1 - Lipid panel  4. Adjustment insomnia New onset.  Persistent.  Patient having some adjustment insomnia with his wife currently and [facility].  We will initiate trazodone 50 mg once a day to  assist with sleeping. - traZODone (DESYREL) 50 MG tablet; Take 0.5-1 tablets (25-50 mg total) by mouth at bedtime as needed for sleep.  Dispense: 30 tablet; Refill: 3  5. Need for pneumococcal vaccination Discussed and administered - Pneumococcal polysaccharide vaccine 23-valent greater than or equal to 2yo subcutaneous/IM

## 2019-02-01 ENCOUNTER — Telehealth: Payer: Self-pay

## 2019-02-01 LAB — RENAL FUNCTION PANEL
Albumin: 4.4 g/dL (ref 3.7–4.7)
BUN/Creatinine Ratio: 13 (ref 10–24)
BUN: 14 mg/dL (ref 8–27)
CO2: 23 mmol/L (ref 20–29)
Calcium: 9.3 mg/dL (ref 8.6–10.2)
Chloride: 101 mmol/L (ref 96–106)
Creatinine, Ser: 1.09 mg/dL (ref 0.76–1.27)
GFR calc Af Amer: 75 mL/min/{1.73_m2} (ref >59)
GFR calc non Af Amer: 65 mL/min/{1.73_m2} (ref >59)
Glucose: 105 mg/dL — ABNORMAL HIGH (ref 65–99)
Phosphorus: 2.4 mg/dL — ABNORMAL LOW (ref 2.8–4.1)
Potassium: 4.5 mmol/L (ref 3.5–5.2)
Sodium: 137 mmol/L (ref 134–144)

## 2019-02-01 LAB — LIPID PANEL
Chol/HDL Ratio: 3.1 ratio (ref 0.0–5.0)
Cholesterol, Total: 90 mg/dL — ABNORMAL LOW (ref 100–199)
HDL: 29 mg/dL — ABNORMAL LOW (ref >39)
LDL Chol Calc (NIH): 38 mg/dL (ref 0–99)
Triglycerides: 128 mg/dL (ref 0–149)
VLDL Cholesterol Cal: 23 mg/dL (ref 5–40)

## 2019-02-01 NOTE — Telephone Encounter (Signed)
error 

## 2019-02-08 ENCOUNTER — Other Ambulatory Visit: Payer: Self-pay | Admitting: Family Medicine

## 2019-02-08 DIAGNOSIS — I10 Essential (primary) hypertension: Secondary | ICD-10-CM

## 2019-02-23 ENCOUNTER — Other Ambulatory Visit: Payer: Self-pay | Admitting: Family Medicine

## 2019-02-23 DIAGNOSIS — F5102 Adjustment insomnia: Secondary | ICD-10-CM

## 2019-03-05 ENCOUNTER — Other Ambulatory Visit: Payer: Self-pay | Admitting: Family Medicine

## 2019-03-05 DIAGNOSIS — I10 Essential (primary) hypertension: Secondary | ICD-10-CM

## 2019-03-14 DIAGNOSIS — D3132 Benign neoplasm of left choroid: Secondary | ICD-10-CM | POA: Diagnosis not present

## 2019-03-22 ENCOUNTER — Other Ambulatory Visit: Payer: Self-pay | Admitting: Family Medicine

## 2019-03-22 DIAGNOSIS — I10 Essential (primary) hypertension: Secondary | ICD-10-CM

## 2019-05-06 DIAGNOSIS — Z7982 Long term (current) use of aspirin: Secondary | ICD-10-CM | POA: Diagnosis not present

## 2019-05-06 DIAGNOSIS — E785 Hyperlipidemia, unspecified: Secondary | ICD-10-CM | POA: Diagnosis not present

## 2019-05-06 DIAGNOSIS — Z008 Encounter for other general examination: Secondary | ICD-10-CM | POA: Diagnosis not present

## 2019-05-06 DIAGNOSIS — N400 Enlarged prostate without lower urinary tract symptoms: Secondary | ICD-10-CM | POA: Diagnosis not present

## 2019-05-06 DIAGNOSIS — I739 Peripheral vascular disease, unspecified: Secondary | ICD-10-CM | POA: Diagnosis not present

## 2019-05-06 DIAGNOSIS — Z87891 Personal history of nicotine dependence: Secondary | ICD-10-CM | POA: Diagnosis not present

## 2019-05-06 DIAGNOSIS — I10 Essential (primary) hypertension: Secondary | ICD-10-CM | POA: Diagnosis not present

## 2019-05-06 DIAGNOSIS — I2510 Atherosclerotic heart disease of native coronary artery without angina pectoris: Secondary | ICD-10-CM | POA: Diagnosis not present

## 2019-05-09 ENCOUNTER — Other Ambulatory Visit: Payer: Self-pay

## 2019-05-09 ENCOUNTER — Encounter: Payer: Self-pay | Admitting: Family Medicine

## 2019-05-09 ENCOUNTER — Ambulatory Visit (INDEPENDENT_AMBULATORY_CARE_PROVIDER_SITE_OTHER): Payer: Medicare HMO | Admitting: Family Medicine

## 2019-05-09 VITALS — BP 128/78 | HR 62 | Resp 16 | Ht 73.0 in | Wt 167.0 lb

## 2019-05-09 DIAGNOSIS — H6991 Unspecified Eustachian tube disorder, right ear: Secondary | ICD-10-CM | POA: Diagnosis not present

## 2019-05-09 DIAGNOSIS — B354 Tinea corporis: Secondary | ICD-10-CM

## 2019-05-09 DIAGNOSIS — M26609 Unspecified temporomandibular joint disorder, unspecified side: Secondary | ICD-10-CM

## 2019-05-09 MED ORDER — CLOTRIMAZOLE-BETAMETHASONE 1-0.05 % EX CREA: 1.0000 "application " | TOPICAL_CREAM | Freq: Two times a day (BID) | CUTANEOUS | 1 refills | Status: DC

## 2019-05-09 MED ORDER — MELOXICAM 15 MG PO TABS: 15.0000 mg | ORAL_TABLET | Freq: Every day | ORAL | 0 refills | Status: DC

## 2019-05-09 MED ORDER — FLUTICASONE PROPIONATE 50 MCG/ACT NA SUSP: 2.0000 | Freq: Every day | NASAL | 6 refills | Status: DC

## 2019-05-09 NOTE — Progress Notes (Signed)
Date:  05/09/2019   Name:  Stuart Jenkins   DOB:  [birth year]   MRN:  [id number]   Chief Complaint: Ear Pain (RIGHT )  Otalgia  There is pain in the right ear. This is a new problem. The current episode started in the past 7 days. The problem occurs constantly. The problem has been waxing and waning ("aggravating"). There has been no fever. The pain is moderate. Pertinent negatives include no abdominal pain, coughing, diarrhea, ear discharge, headaches, hearing loss, neck pain, rash, rhinorrhea, sore throat or vomiting. He has tried nothing for the symptoms. The treatment provided mild relief. There is no history of a chronic ear infection or hearing loss.  Rash This is a recurrent problem. The current episode started more than 1 year ago. The problem has been waxing and waning since onset. The affected locations include the groin. The rash is characterized by redness and itchiness. Pertinent negatives include no cough, diarrhea, fever, rhinorrhea, shortness of breath, sore throat or vomiting. Past treatments include topical steroids (antifungal). The treatment provided moderate relief.    Lab Results  Component Value Date   CREATININE 1.09 01/31/2019   BUN 14 01/31/2019   NA 137 01/31/2019   K 4.5 01/31/2019   CL 101 01/31/2019   CO2 23 01/31/2019   Lab Results  Component Value Date   CHOL 90 (L) 01/31/2019   HDL 29 (L) 01/31/2019   LDLCALC 38 01/31/2019   TRIG 128 01/31/2019   CHOLHDL 3.1 01/31/2019   No results found for: TSH Lab Results  Component Value Date   HGBA1C 5.5 12/24/2014     Review of Systems  Constitutional: Negative for chills and fever.  HENT: Positive for ear pain. Negative for drooling, ear discharge, hearing loss, rhinorrhea and sore throat.   Respiratory: Negative for cough, shortness of breath and wheezing.   Cardiovascular: Negative for chest pain, palpitations and leg swelling.  Gastrointestinal: Negative for abdominal pain, blood in stool,  constipation, diarrhea, nausea and vomiting.  Endocrine: Negative for polydipsia.  Genitourinary: Negative for dysuria, frequency, hematuria and urgency.  Musculoskeletal: Negative for back pain, myalgias and neck pain.  Skin: Negative for rash.  Allergic/Immunologic: Negative for environmental allergies.  Neurological: Negative for dizziness and headaches.  Hematological: Does not bruise/bleed easily.  Psychiatric/Behavioral: Negative for suicidal ideas. The patient is not nervous/anxious.     Patient Active Problem List   Diagnosis Date Noted  . Pain in limb 02/12/2018  . Coronary artery disease involving coronary bypass graft of native heart without angina pectoris 07/30/2017  . Mixed hyperlipidemia 09/11/2016  . Benign prostatic hyperplasia with lower urinary tract symptoms 09/11/2016  . Hematospermia 07/10/2015  . Essential hypertension 06/12/2015  . Cervical disc disorder 04/10/2015  . Hypotension 12/24/2014  . Hyponatremia 12/24/2014  . Anemia 12/24/2014  . Hyperglycemia 12/24/2014  . Syncopal episodes 12/23/2014  . Acute postoperative pain 12/07/2014  . Cigarette smoker 12/06/2014  . Primary localized osteoarthrosis, hand 11/17/2013  . Chronic prostatitis 03/09/2012    Allergies  Allergen Reactions  . Oxycodone Hcl Other (See Comments)    Altered mental status  . Sulfa Antibiotics Rash    Past Surgical History:  Procedure Laterality Date  . APPENDECTOMY    . CARDIAC CATHETERIZATION N/A 11/08/2014   Procedure: Left Heart Cath and Coronary Angiography;  Surgeon: Yolonda Kida, MD;  Location: [place] [facility];  Service: Cardiovascular;  Laterality: N/A;  . CARDIOVERSION    . CATARACT EXTRACTION W/PHACO Right 06/01/2018  Procedure: CATARACT EXTRACTION PHACO AND INTRAOCULAR LENS PLACEMENT ([place]) RIGHT;  Surgeon: Birder Robson, MD;  Location: [facility];  Service: Ophthalmology;  Laterality: Right;  [place] 00:50.5 CDE 7.01 Fluid Pack Lot # [id number]  . CATARACT  EXTRACTION W/PHACO Left 08/10/2018   Procedure: CATARACT EXTRACTION PHACO AND INTRAOCULAR LENS PLACEMENT ([place])  COMPLICATED LEFT MALYUGIN;  Surgeon: Birder Robson, MD;  Location: [place];  Service: Ophthalmology;  Laterality: Left;  MALYUGIN  . COLONOSCOPY    . COLONOSCOPY WITH PROPOFOL N/A 02/23/2017   Procedure: COLONOSCOPY WITH PROPOFOL;  Surgeon: Lollie Sails, MD;  Location: [facility] ENDOSCOPY;  Service: Endoscopy;  Laterality: N/A;  . CORONARY ANGIOPLASTY    . CORONARY ARTERY BYPASS GRAFT    . ELECTROPHYSIOLOGIC STUDY N/A 01/30/2015   Procedure: Cardioversion;  Surgeon: Yolonda Kida, MD;  Location: [facility] OR[facility];  Service: Cardiovascular;  Laterality: N/A;  . FRACTURE SURGERY Left    arm, knee, collar bone  . FRACTURE SURGERY    . GASTRIC RESECTION     partial  . open heart surgery  12/05/2014    Social History   Tobacco Use  . Smoking status: Former Smoker    Quit date: 11/07/1984    Years since quitting: 34.5  . Smokeless tobacco: Never Used  Substance Use Topics  . Alcohol use: Yes    Comment: less than 1 drink/week  . Drug use: No     Medication list has been reviewed and updated.  Current Meds  Medication Sig  . amLODipine (NORVASC) 5 MG tablet Take 1 tablet (5 mg total) by mouth daily.  Marland Kitchen aspirin EC 81 MG tablet Take 1 tablet (81 mg total) by mouth daily.  Marland Kitchen atorvastatin (LIPITOR) 40 MG tablet Take 1 tablet (40 mg total) by mouth daily.  . metoprolol succinate (TOPROL-XL) 25 MG 24 hr tablet TAKE 1 TABLET BY MOUTH EVERYDAY AT BEDTIME  . tamsulosin (FLOMAX) 0.4 MG CAPS capsule Take 1 capsule (0.4 mg total) by mouth daily.  . traZODone (DESYREL) 50 MG tablet TAKE 1/2-1 TABLETS BY MOUTH AT BEDTIME AS NEEDED FOR SLEEP.    PHQ 2/9 Scores 01/31/2019 07/21/2018 02/04/2018 12/31/2017  PHQ - 2 Score 0 0 0 0  PHQ- 9 Score 2 0 0 -    BP Readings from Last 3 Encounters:  05/09/19 128/78  01/31/19 100/60  08/10/18 136/78    Physical Exam Vitals and  nursing note reviewed.  HENT:     Head: Normocephalic.     Jaw: There is normal jaw occlusion. Tenderness present.     Salivary Glands: Right salivary gland is not diffusely enlarged. Left salivary gland is not diffusely enlarged.     Comments: Tender right tmj    Right Ear: Hearing and external ear normal. No decreased hearing noted. There is no impacted cerumen. Tympanic membrane is retracted.     Left Ear: Hearing, tympanic membrane, ear canal and external ear normal. There is no impacted cerumen. Tympanic membrane is not retracted.     Ears:     Comments: Unable to clear ears     Nose: Nose normal.  Eyes:     General: No scleral icterus.       Right eye: No discharge.        Left eye: No discharge.     Conjunctiva/sclera: Conjunctivae normal.     Pupils: Pupils are equal, round, and reactive to light.  Neck:     Thyroid: No thyromegaly.     Vascular: No JVD.  Trachea: No tracheal deviation.  Cardiovascular:     Rate and Rhythm: Normal rate and regular rhythm.     Heart sounds: Normal heart sounds, S1 normal and S2 normal. Heart sounds not distant. No murmur. No systolic murmur. No diastolic murmur. No friction rub. No gallop. No S3 or S4 sounds.   Pulmonary:     Effort: No respiratory distress.     Breath sounds: Normal breath sounds. No wheezing, rhonchi or rales.  Abdominal:     General: Bowel sounds are normal.     Palpations: Abdomen is soft. There is no mass.     Tenderness: There is no abdominal tenderness. There is no guarding or rebound.  Musculoskeletal:        General: No tenderness. Normal range of motion.     Cervical back: Normal range of motion and neck supple.     Right lower leg: No edema.     Left lower leg: No edema.  Lymphadenopathy:     Cervical: No cervical adenopathy.  Skin:    General: Skin is warm.     Findings: No rash.  Neurological:     Mental Status: He is alert and oriented to person, place, and time.     Cranial Nerves: No cranial nerve  deficit.     Deep Tendon Reflexes: Reflexes are normal and symmetric.     Wt Readings from Last 3 Encounters:  05/09/19 167 lb (75.8 kg)  01/31/19 160 lb (72.6 kg)  08/10/18 161 lb 6.4 oz (73.2 kg)    BP 128/78   Pulse 62   Resp 16   Ht 6\' 1"  (1.854 m)   Wt 167 lb (75.8 kg)   SpO2 95%   BMI 22.03 kg/m   Assessment and Plan:  1. TMJ (temporomandibular joint disorder) Exam is consistent with exquisite tenderness of the right TMJ.  Patient does not recall any injury or eating of ice or hard candy.  This exam is consistent with TMJ disorder and we will initiate meloxicam 15 mg once a day for 2 weeks. - meloxicam (MOBIC) 15 MG tablet; Take 1 tablet (15 mg total) by mouth daily.  Dispense: 14 tablet; Refill: 0  2. Eustachian tube disorder, right Right TM was noted to be slightly retracted status post irrigation for cerumen.  Patient was unable to clear the ear with barometric maneuver.  Will initiate fluticasone nasal spray for reduction of swelling around the eustachian tube. - fluticasone (FLONASE) 50 MCG/ACT nasal spray; Place 2 sprays into both nostrils daily.  Dispense: 16 g; Refill: 6  3. Tinea corporis Patient needs refill of Lotrisone for the area in the inguinal groin area on a as needed basis. - clotrimazole-betamethasone (LOTRISONE) cream; Apply 1 application topically 2 (two) times daily.  Dispense: 30 g; Refill: 1

## 2019-05-09 NOTE — Patient Instructions (Signed)

## 2019-05-18 DIAGNOSIS — L821 Other seborrheic keratosis: Secondary | ICD-10-CM | POA: Diagnosis not present

## 2019-05-18 DIAGNOSIS — D1801 Hemangioma of skin and subcutaneous tissue: Secondary | ICD-10-CM | POA: Diagnosis not present

## 2019-05-18 DIAGNOSIS — L853 Xerosis cutis: Secondary | ICD-10-CM | POA: Diagnosis not present

## 2019-06-17 ENCOUNTER — Other Ambulatory Visit: Payer: Self-pay

## 2019-06-17 ENCOUNTER — Telehealth: Payer: Self-pay

## 2019-06-17 DIAGNOSIS — M109 Gout, unspecified: Secondary | ICD-10-CM

## 2019-06-17 MED ORDER — INDOMETHACIN 50 MG PO CAPS: 50.0000 mg | ORAL_CAPSULE | Freq: Three times a day (TID) | ORAL | 0 refills | Status: DC

## 2019-06-17 NOTE — Telephone Encounter (Signed)
Pt called c/o great toe pain/ gout? Stated he can't get up and put any pressure on foot. He asked that we call in something for this and he will come in on Monday. Sent in Indomethacin and gave appt for Monday morning

## 2019-06-20 ENCOUNTER — Encounter: Payer: Self-pay | Admitting: Family Medicine

## 2019-06-20 ENCOUNTER — Other Ambulatory Visit: Payer: Self-pay

## 2019-06-20 ENCOUNTER — Ambulatory Visit (INDEPENDENT_AMBULATORY_CARE_PROVIDER_SITE_OTHER): Payer: Medicare HMO | Admitting: Family Medicine

## 2019-06-20 VITALS — BP 130/88 | HR 64 | Ht 73.0 in | Wt 162.0 lb

## 2019-06-20 DIAGNOSIS — M109 Gout, unspecified: Secondary | ICD-10-CM

## 2019-06-20 NOTE — Progress Notes (Signed)
Date:  06/20/2019   Name:  Stuart Jenkins   DOB:  11/21/1940   MRN:  [id number]   Chief Complaint: Follow-up (gout- taking indomethacin- helping)  Foot Injury  The incident occurred 3 to 5 days ago (no injury/sudden onset right graet toe). There was no injury mechanism. The pain is present in the right toes. The quality of the pain is described as stabbing and shooting. The pain is at a severity of 9/10. The pain is severe. Associated symptoms include an inability to bear weight and a loss of motion. Pertinent negatives include no loss of sensation, muscle weakness or numbness. The symptoms are aggravated by movement, palpation and weight bearing. He has tried NSAIDs (indomethacin) for the symptoms. The treatment provided mild relief.    Lab Results  Component Value Date   CREATININE 1.09 01/31/2019   BUN 14 01/31/2019   NA 137 01/31/2019   K 4.5 01/31/2019   CL 101 01/31/2019   CO2 23 01/31/2019   Lab Results  Component Value Date   CHOL 90 (L) 01/31/2019   HDL 29 (L) 01/31/2019   LDLCALC 38 01/31/2019   TRIG 128 01/31/2019   CHOLHDL 3.1 01/31/2019   No results found for: TSH Lab Results  Component Value Date   HGBA1C 5.5 12/24/2014   Lab Results  Component Value Date   WBC 4.3 12/23/2014   HGB 9.9 (L) 12/24/2014   HCT 31.7 (L) 12/23/2014   MCV 90.5 12/23/2014   PLT 384 12/23/2014   Lab Results  Component Value Date   ALT 40 07/21/2018   AST 36 07/21/2018   ALKPHOS 97 07/21/2018   BILITOT 0.6 07/21/2018     Review of Systems  Constitutional: Negative for chills and fever.  HENT: Negative for drooling, ear discharge, ear pain and sore throat.   Respiratory: Negative for cough, shortness of breath and wheezing.   Cardiovascular: Negative for chest pain, palpitations and leg swelling.  Gastrointestinal: Negative for abdominal pain, blood in stool, constipation, diarrhea and nausea.  Endocrine: Negative for polydipsia.  Genitourinary: Negative for  dysuria, frequency, hematuria and urgency.  Musculoskeletal: Negative for back pain, myalgias and neck pain.  Skin: Negative for rash.  Allergic/Immunologic: Negative for environmental allergies.  Neurological: Negative for dizziness, numbness and headaches.  Hematological: Does not bruise/bleed easily.  Psychiatric/Behavioral: Negative for suicidal ideas. The patient is not nervous/anxious.     Patient Active Problem List   Diagnosis Date Noted  . Pain in limb 02/12/2018  . Coronary artery disease involving coronary bypass graft of native heart without angina pectoris 07/30/2017  . Mixed hyperlipidemia 09/11/2016  . Benign prostatic hyperplasia with lower urinary tract symptoms 09/11/2016  . Hematospermia 07/10/2015  . Essential hypertension 06/12/2015  . Cervical disc disorder 04/10/2015  . Hypotension 12/24/2014  . Hyponatremia 12/24/2014  . Anemia 12/24/2014  . Hyperglycemia 12/24/2014  . Syncopal episodes 12/23/2014  . Acute postoperative pain 12/07/2014  . Cigarette smoker 12/06/2014  . Primary localized osteoarthrosis, hand 11/17/2013  . Chronic prostatitis 03/09/2012    Allergies  Allergen Reactions  . Oxycodone Hcl Other (See Comments)    Altered mental status  . Sulfa Antibiotics Rash    Past Surgical History:  Procedure Laterality Date  . APPENDECTOMY    . CARDIAC CATHETERIZATION N/A 11/08/2014   Procedure: Left Heart Cath and Coronary Angiography;  Surgeon: Yolonda Kida, MD;  Location: [place] [facility];  Service: Cardiovascular;  Laterality: N/A;  . CARDIOVERSION    . CATARACT  EXTRACTION W/PHACO Right 06/01/2018   Procedure: CATARACT EXTRACTION PHACO AND INTRAOCULAR LENS PLACEMENT ([place]) RIGHT;  Surgeon: Birder Robson, MD;  Location: [facility];  Service: Ophthalmology;  Laterality: Right;  [place] 00:50.5 CDE 7.01 Fluid Pack Lot # [id number]  . CATARACT EXTRACTION W/PHACO Left 08/10/2018   Procedure: CATARACT EXTRACTION PHACO AND INTRAOCULAR LENS PLACEMENT  ([place])  COMPLICATED LEFT MALYUGIN;  Surgeon: Birder Robson, MD;  Location: [place];  Service: Ophthalmology;  Laterality: Left;  MALYUGIN  . COLONOSCOPY    . COLONOSCOPY WITH PROPOFOL N/A 02/23/2017   Procedure: COLONOSCOPY WITH PROPOFOL;  Surgeon: Lollie Sails, MD;  Location: [facility];  Service: Endoscopy;  Laterality: N/A;  . CORONARY ANGIOPLASTY    . CORONARY ARTERY BYPASS GRAFT    . ELECTROPHYSIOLOGIC STUDY N/A 01/30/2015   Procedure: Cardioversion;  Surgeon: Yolonda Kida, MD;  Location: [facility];  Service: Cardiovascular;  Laterality: N/A;  . FRACTURE SURGERY Left    arm, knee, collar bone  . FRACTURE SURGERY    . GASTRIC RESECTION     partial  . open heart surgery  12/05/2014    Social History   Tobacco Use  . Smoking status: Former Smoker    Quit date: 11/07/1984    Years since quitting: 34.6  . Smokeless tobacco: Never Used  Substance Use Topics  . Alcohol use: Yes    Comment: less than 1 drink/week  . Drug use: No     Medication list has been reviewed and updated.  Current Meds  Medication Sig  . amLODipine (NORVASC) 5 MG tablet Take 1 tablet (5 mg total) by mouth daily.  Marland Kitchen aspirin EC 81 MG tablet Take 1 tablet (81 mg total) by mouth daily.  Marland Kitchen atorvastatin (LIPITOR) 40 MG tablet Take 1 tablet (40 mg total) by mouth daily.  . clotrimazole-betamethasone (LOTRISONE) cream Apply 1 application topically 2 (two) times daily.  . fluticasone (FLONASE) 50 MCG/ACT nasal spray Place 2 sprays into both nostrils daily.  . indomethacin (INDOCIN) 50 MG capsule Take 1 capsule (50 mg total) by mouth 3 (three) times daily with meals.  . meloxicam (MOBIC) 15 MG tablet Take 1 tablet (15 mg total) by mouth daily.  . metoprolol succinate (TOPROL-XL) 25 MG 24 hr tablet TAKE 1 TABLET BY MOUTH EVERYDAY AT BEDTIME  . tamsulosin (FLOMAX) 0.4 MG CAPS capsule Take 1 capsule (0.4 mg total) by mouth daily.    PHQ 2/9 Scores 06/20/2019 01/31/2019 07/21/2018 02/04/2018    PHQ - 2 Score 0 0 0 0  PHQ- 9 Score 0 2 0 0    BP Readings from Last 3 Encounters:  06/20/19 130/88  05/09/19 128/78  01/31/19 100/60    Physical Exam HENT:     Head: Normocephalic.     Right Ear: Tympanic membrane and external ear normal.     Left Ear: Tympanic membrane and external ear normal.     Nose: Nose normal.  Eyes:     General: No scleral icterus.       Right eye: No discharge.        Left eye: No discharge.     Conjunctiva/sclera: Conjunctivae normal.     Pupils: Pupils are equal, round, and reactive to light.  Neck:     Thyroid: No thyromegaly.     Vascular: No JVD.     Trachea: No tracheal deviation.  Cardiovascular:     Rate and Rhythm: Normal rate and regular rhythm.     Pulses:  Dorsalis pedis pulses are 2+ on the right side.       Posterior tibial pulses are 2+ on the right side.     Heart sounds: Normal heart sounds. No murmur. No friction rub. No gallop.   Pulmonary:     Effort: No respiratory distress.     Breath sounds: Normal breath sounds. No wheezing or rales.  Abdominal:     General: Bowel sounds are normal.     Palpations: Abdomen is soft. There is no mass.     Tenderness: There is no abdominal tenderness. There is no guarding or rebound.  Musculoskeletal:        General: No tenderness. Normal range of motion.     Cervical back: Normal range of motion and neck supple.     Right foot: No swelling, tenderness or bony tenderness.  Feet:     Right foot:     Skin integrity: No ulcer, blister, skin breakdown, erythema, warmth, callus, dry skin or fissure.     Comments: No palpable tenderness Lymphadenopathy:     Cervical: No cervical adenopathy.  Skin:    General: Skin is warm.     Findings: No rash.  Neurological:     Mental Status: He is alert and oriented to person, place, and time.     Cranial Nerves: No cranial nerve deficit.     Deep Tendon Reflexes: Reflexes are normal and symmetric.     Wt Readings from Last 3  Encounters:  06/20/19 162 lb (73.5 kg)  05/09/19 167 lb (75.8 kg)  01/31/19 160 lb (72.6 kg)    BP 130/88   Pulse 64   Ht 6\' 1"  (1.854 m)   Wt 162 lb (73.5 kg)   BMI 21.37 kg/m   Assessment and Plan:  1. Acute gout of foot, unspecified cause, unspecified laterality Acute.  Resolved.  Stable.  Patient awoke Thursday with exquisite tenderness of his right MP joint of the foot.  This is consistent with an acute gout attack which he had on one other occasion 10 years ago.  Patient was treated with indomethacin 50 mg 3 times a day and within 3 doses the condition had resolved.  We will check a uric acid to determine if there is elevation of it.  We rechecked also his creatinine BUN and GFR and these are all in normal range and there is no risk of nephro toxicity secondary to uric acid nephropathy. - Uric acid

## 2019-06-20 NOTE — Patient Instructions (Addendum)
Gout  Gout is a condition that causes painful swelling of the joints. Gout is a type of inflammation of the joints (arthritis). This condition is caused by having too much uric acid in the body. Uric acid is a chemical that forms when the body breaks down substances called purines. Purines are important for building body proteins. When the body has too much uric acid, sharp crystals can form and build up inside the joints. This causes pain and swelling. Gout attacks can happen quickly and may be very painful (acute gout). Over time, the attacks can affect more joints and become more frequent (chronic gout). Gout can also cause uric acid to build up under the skin and inside the kidneys. What are the causes? This condition is caused by too much uric acid in your blood. This can happen because:  Your kidneys do not remove enough uric acid from your blood. This is the most common cause.  Your body makes too much uric acid. This can happen with some cancers and cancer treatments. It can also occur if your body is breaking down too many red blood cells (hemolytic anemia).  You eat too many foods that are high in purines. These foods include organ meats and some seafood. Alcohol, especially beer, is also high in purines. A gout attack may be triggered by trauma or stress. What increases the risk? You are more likely to develop this condition if you:  Have a family history of gout.  Are male and middle-aged.  Are male and have gone through menopause.  Are obese.  Frequently drink alcohol, especially beer.  Are dehydrated.  Lose weight too quickly.  Have an organ transplant.  Have lead poisoning.  Take certain medicines, including aspirin, cyclosporine, diuretics, levodopa, and niacin.  Have kidney disease.  Have a skin condition called psoriasis. What are the signs or symptoms? An attack of acute gout happens quickly. It usually occurs in just one joint. The most common place is  the big toe. Attacks often start at night. Other joints that may be affected include joints of the feet, ankle, knee, fingers, wrist, or elbow. Symptoms of this condition may include:  Severe pain.  Warmth.  Swelling.  Stiffness.  Tenderness. The affected joint may be very painful to touch.  Shiny, red, or purple skin.  Chills and fever. Chronic gout may cause symptoms more frequently. More joints may be involved. You may also have white or yellow lumps (tophi) on your hands or feet or in other areas near your joints. How is this diagnosed? This condition is diagnosed based on your symptoms, medical history, and physical exam. You may have tests, such as:  Blood tests to measure uric acid levels.  Removal of joint fluid with a thin needle (aspiration) to look for uric acid crystals.  X-rays to look for joint damage. How is this treated? Treatment for this condition has two phases: treating an acute attack and preventing future attacks. Acute gout treatment may include medicines to reduce pain and swelling, including:  NSAIDs.  Steroids. These are strong anti-inflammatory medicines that can be taken by mouth (orally) or injected into a joint.  Colchicine. This medicine relieves pain and swelling when it is taken soon after an attack. It can be given by mouth or through an IV. Preventive treatment may include:  Daily use of smaller doses of NSAIDs or colchicine.  Use of a medicine that reduces uric acid levels in your blood.  Changes to your diet. You may   need to see a dietitian about what to eat and drink to prevent gout. Follow these instructions at home: During a gout attack   If directed, put ice on the affected area: ? Put ice in a plastic bag. ? Place a towel between your skin and the bag. ? Leave the ice on for 20 minutes, 2-3 times a day.  Raise (elevate) the affected joint above the level of your heart as often as possible.  Rest the joint as much as possible.  If the affected joint is in your leg, you may be given crutches to use.  Follow instructions from your health care provider about eating or drinking restrictions. Avoiding future gout attacks  Follow a low-purine diet as told by your dietitian or health care provider. Avoid foods and drinks that are high in purines, including liver, kidney, anchovies, asparagus, herring, mushrooms, mussels, and beer.  Maintain a healthy weight or lose weight if you are overweight. If you want to lose weight, talk with your health care provider. It is important that you do not lose weight too quickly.  Start or maintain an exercise program as told by your health care provider. Eating and drinking  Drink enough fluids to keep your urine pale yellow.  If you drink alcohol: ? Limit how much you use to:  0-1 drink a day for women.  0-2 drinks a day for men. ? Be aware of how much alcohol is in your drink. In the [place] one drink equals one 12 oz bottle of beer (355 mL) one 5 oz glass of wine (148 mL), or one 1 oz glass of hard liquor (44 mL). General instructions  Take over-the-counter and prescription medicines only as told by your health care provider.  Do not drive or use heavy machinery while taking prescription pain medicine.  Return to your normal activities as told by your health care provider. Ask your health care provider what activities are safe for you.  Keep all follow-up visits as told by your health care provider. This is important. Contact a health care provider if you have:  Another gout attack.  Continuing symptoms of a gout attack after 10 days of treatment.  Side effects from your medicines.  Chills or a fever.  Burning pain when you urinate.  Pain in your lower back or belly. Get help right away if you:  Have severe or uncontrolled pain.  Cannot urinate. Summary  Gout is painful swelling of the joints caused by inflammation.  The most common site of pain is the big  toe, but it can affect other joints in the body.  Medicines and dietary changes can help to prevent and treat gout attacks. This information is not intended to replace advice given to you by your health care provider. Make sure you discuss any questions you have with your health care provider. Document Revised: 09/30/2017 Document Reviewed: 09/30/2017 Elsevier Patient Education  [place] A low-purine eating plan involves making food choices to limit your intake of purine. Purine is a kind of uric acid. Too much uric acid in your blood can cause certain conditions, such as gout and kidney stones. Eating a low-purine diet can help control these conditions. What are tips for following this plan? Reading food labels   Avoid foods with saturated or Trans fat.  Check the ingredient list of grains-based foods, such as bread and cereal, to make sure that they contain whole grains.  Check the ingredient list of  sauces or soups to make sure they do not contain meat or fish.  When choosing soft drinks, check the ingredient list to make sure they do not contain high-fructose corn syrup. Shopping  Buy plenty of fresh fruits and vegetables.  Avoid buying canned or fresh fish.  Buy dairy products labeled as low-fat or nonfat.  Avoid buying premade or processed foods. These foods are often high in fat, salt (sodium), and added sugar. Cooking  Use olive oil instead of butter when cooking. Oils like olive oil, canola oil, and sunflower oil contain healthy fats. Meal planning  Learn which foods do or do not affect you. If you find out that a food tends to cause your gout symptoms to flare up, avoid eating that food. You can enjoy foods that do not cause problems. If you have any questions about a food item, talk with your dietitian or health care provider.  Limit foods high in fat, especially saturated fat. Fat makes it harder for your body to get rid of uric  acid.  Choose foods that are lower in fat and are lean sources of protein. General guidelines  Limit alcohol intake to no more than 1 drink a day for nonpregnant women and 2 drinks a day for men. One drink equals 12 oz of beer, 5 oz of wine, or 1 oz of hard liquor. Alcohol can affect the way your body gets rid of uric acid.  Drink plenty of water to keep your urine clear or pale yellow. Fluids can help remove uric acid from your body.  If directed by your health care provider, take a vitamin C supplement.  Work with your health care provider and dietitian to develop a plan to achieve or maintain a healthy weight. Losing weight can help reduce uric acid in your blood. What foods are recommended? The items listed may not be a complete list. Talk with your dietitian about what dietary choices are best for you. Foods low in purines Foods low in purines do not need to be limited. These include:  All fruits.  All low-purine vegetables, pickles, and olives.  Breads, pasta, rice, cornbread, and popcorn. Cake and other baked goods.  All dairy foods.  Eggs, nuts, and nut butters.  Spices and condiments, such as salt, herbs, and vinegar.  Plant oils, butter, and margarine.  Water, sugar-free soft drinks, tea, coffee, and cocoa.  Vegetable-based soups, broths, sauces, and gravies. Foods moderate in purines Foods moderate in purines should be limited to the amounts listed.   cup of asparagus, cauliflower, spinach, mushrooms, or green peas, each day.  2/3 cup uncooked oatmeal, each day.   cup dry wheat bran or wheat germ, each day.  2-3 ounces of meat or poultry, each day.  4-6 ounces of shellfish, such as crab, lobster, oysters, or shrimp, each day.  1 cup cooked beans, peas, or lentils, each day.  Soup, broths, or bouillon made from meat or fish. Limit these foods as much as possible. What foods are not recommended? The items listed may not be a complete list. Talk with your  dietitian about what dietary choices are best for you. Limit your intake of foods high in purines, including:  Beer and other alcohol.  Meat-based gravy or sauce.  Canned or fresh fish, such as: ? Anchovies, sardines, herring, and tuna. ? Mussels and scallops. ? Codfish, trout, and haddock.  [place] Salines.  Organ meats, such as: ? Liver or kidney. ? Tripe. ? Sweetbreads (thymus gland or pancreas).  Wild game or goose.  Yeast or yeast extract supplements.  Drinks sweetened with high-fructose corn syrup. Summary  Eating a low-purine diet can help control conditions caused by too much uric acid in the body, such as gout or kidney stones.  Choose low-purine foods, limit alcohol, and limit foods high in fat.  You will learn over time which foods do or do not affect you. If you find out that a food tends to cause your gout symptoms to flare up, avoid eating that food. This information is not intended to replace advice given to you by your health care provider. Make sure you discuss any questions you have with your health care provider. Document Revised: 02/20/2017 Document Reviewed: 04/23/2016 Elsevier Patient Education  2020 [employer]  

## 2019-06-21 LAB — URIC ACID: Uric Acid: 6.4 mg/dL (ref 3.8–8.4)

## 2019-07-27 ENCOUNTER — Ambulatory Visit (INDEPENDENT_AMBULATORY_CARE_PROVIDER_SITE_OTHER): Payer: Medicare HMO

## 2019-07-27 VITALS — Ht 71.0 in | Wt 168.0 lb

## 2019-07-27 DIAGNOSIS — Z0000 Encounter for general adult medical examination without abnormal findings: Secondary | ICD-10-CM

## 2019-07-27 NOTE — Progress Notes (Signed)
Subjective:   Stuart Jenkins is a 79 y.o. male who presents for an Initial Medicare Annual Wellness Visit.  Virtual Visit via Telephone Note  I connected with  Stuart Jenkins on 07/27/19 at 11:20 AM EDT by telephone and verified that I am speaking with the correct person using two identifiers.  Medicare Annual Wellness visit completed telephonically due to Covid-19 pandemic.   Location: Patient: home Provider: office   I discussed the limitations, risks, security and privacy concerns of performing an evaluation and management service by telephone and the availability of in person appointments. The patient expressed understanding and agreed to proceed.  Unable to perform video visit due to patient does not have video capability.   Some vital signs may be absent or patient reported.   Clemetine Marker, LPN    Review of Systems   Cardiac Risk Factors include: advanced age (>78men, >56 women);dyslipidemia;male gender;hypertension    Objective:    Today's Vitals   07/27/19 1140  Weight: 168 lb (76.2 kg)  Height: 5\' 11"  (1.803 m)   Body mass index is 23.43 kg/m.  Advanced Directives 07/27/2019 08/10/2018 06/01/2018 02/23/2017 01/30/2015 12/23/2014 11/22/2014  Does Patient Have a Medical Advance Directive? No No No No No Yes;No No  Does patient want to make changes to medical advance directive? - - - - - No - Patient declined -  Would patient like information on creating a medical advance directive? Yes (MAU/Ambulatory/Procedural Areas - Information given) No - Patient declined Yes (ED - Information included in AVS) - No - patient declined information No - patient declined information No - patient declined information    Current Medications (verified) Outpatient Encounter Medications as of 07/27/2019  Medication Sig  . amLODipine (NORVASC) 5 MG tablet Take 1 tablet (5 mg total) by mouth daily.  Marland Kitchen aspirin EC 81 MG tablet Take 1 tablet (81 mg total) by mouth daily.  Marland Kitchen  atorvastatin (LIPITOR) 40 MG tablet Take 1 tablet (40 mg total) by mouth daily.  . fluticasone (FLONASE) 50 MCG/ACT nasal spray Place 2 sprays into both nostrils daily. (Patient taking differently: Place 2 sprays into both nostrils daily. PRN)  . metoprolol succinate (TOPROL-XL) 25 MG 24 hr tablet TAKE 1 TABLET BY MOUTH EVERYDAY AT BEDTIME  . tamsulosin (FLOMAX) 0.4 MG CAPS capsule Take 1 capsule (0.4 mg total) by mouth daily.  . traZODone (DESYREL) 50 MG tablet TAKE 1/2-1 TABLETS BY MOUTH AT BEDTIME AS NEEDED FOR SLEEP.  . [DISCONTINUED] clotrimazole-betamethasone (LOTRISONE) cream Apply 1 application topically 2 (two) times daily.  . [DISCONTINUED] indomethacin (INDOCIN) 50 MG capsule Take 1 capsule (50 mg total) by mouth 3 (three) times daily with meals.  . [DISCONTINUED] meloxicam (MOBIC) 15 MG tablet Take 1 tablet (15 mg total) by mouth daily.   No facility-administered encounter medications on file as of 07/27/2019.    Allergies (verified) Oxycodone hcl and Sulfa antibiotics   History: Past Medical History:  Diagnosis Date  . Atrial fibrillation ([place])   . BPH (benign prostatic hypertrophy)   . Colon polyp   . Coronary artery disease   . Dysrhythmia    AFIB POST CABG / CARDIOVERSION  . Gastric bleed    SURGERY  . Gout   . Hyperlipidemia   . Hypertension   . Peptic ulcer disease    Past Surgical History:  Procedure Laterality Date  . APPENDECTOMY    . CARDIAC CATHETERIZATION N/A 11/08/2014   Procedure: Left Heart Cath and Coronary Angiography;  Surgeon: Loran Senters  Callwood, MD;  Location: [place] [facility];  Service: Cardiovascular;  Laterality: N/A;  . CARDIOVERSION    . CATARACT EXTRACTION W/PHACO Right 06/01/2018   Procedure: CATARACT EXTRACTION PHACO AND INTRAOCULAR LENS PLACEMENT ([place]) RIGHT;  Surgeon: Birder Robson, MD;  Location: [facility];  Service: Ophthalmology;  Laterality: Right;  [place] 00:50.5 CDE 7.01 Fluid Pack Lot # [id number]  . CATARACT EXTRACTION W/PHACO  Left 08/10/2018   Procedure: CATARACT EXTRACTION PHACO AND INTRAOCULAR LENS PLACEMENT ([place])  COMPLICATED LEFT MALYUGIN;  Surgeon: Birder Robson, MD;  Location: [place];  Service: Ophthalmology;  Laterality: Left;  MALYUGIN  . COLONOSCOPY    . COLONOSCOPY WITH PROPOFOL N/A 02/23/2017   Procedure: COLONOSCOPY WITH PROPOFOL;  Surgeon: Lollie Sails, MD;  Location: [facility];  Service: Endoscopy;  Laterality: N/A;  . CORONARY ANGIOPLASTY    . CORONARY ARTERY BYPASS GRAFT    . ELECTROPHYSIOLOGIC STUDY N/A 01/30/2015   Procedure: Cardioversion;  Surgeon: Yolonda Kida, MD;  Location: [facility];  Service: Cardiovascular;  Laterality: N/A;  . FRACTURE SURGERY Left    arm, knee, collar bone  . FRACTURE SURGERY    . GASTRIC RESECTION     partial  . open heart surgery  12/05/2014   Family History  Problem Relation Age of Onset  . Hypertension Brother    Social History   Socioeconomic History  . Marital status: Married    Spouse name: Not on file  . Number of children: 2  . Years of education: Not on file  . Highest education level: Not on file  Occupational History  . Not on file  Tobacco Use  . Smoking status: Former Smoker    Quit date: 11/07/1984    Years since quitting: 34.7  . Smokeless tobacco: Never Used  Substance and Sexual Activity  . Alcohol use: Yes    Comment: less than 1 drink/week  . Drug use: No  . Sexual activity: Yes  Other Topics Concern  . Not on file  Social History Narrative  . Not on file   Social Determinants of Health   Financial Resource Strain: Low Risk   . Difficulty of Paying Living Expenses: Not hard at all  Food Insecurity: No Food Insecurity  . Worried About Charity fundraiser in the Last Year: Never true  . Ran Out of Food in the Last Year: Never true  Transportation Needs: No Transportation Needs  . Lack of Transportation (Medical): No  . Lack of Transportation (Non-Medical): No  Physical Activity: Insufficiently  Active  . Days of Exercise per Week: 2 days  . Minutes of Exercise per Session: 20 min  Stress: No Stress Concern Present  . Feeling of Stress : Not at all  Social Connections: Unknown  . Frequency of Communication with Friends and Family: Patient refused  . Frequency of Social Gatherings with Friends and Family: Patient refused  . Attends Religious Services: Patient refused  . Active Member of Clubs or Organizations: Patient refused  . Attends Archivist Meetings: Patient refused  . Marital Status: Married   Tobacco Counseling Counseling given: Not Answered   Clinical Intake:  Pre-visit preparation completed: Yes  Pain : No/denies pain     BMI - recorded: 23.43 Nutritional Status: BMI of 19-24  Normal Nutritional Risks: None Diabetes: No  How often do you need to have someone help you when you read instructions, pamphlets, or other written materials from your doctor or pharmacy?: 1 - Never  Interpreter Needed?: No  Information entered by :: Clemetine Marker LPN  Activities of Daily Living In your present state of health, do you have any difficulty performing the following activities: 07/27/2019 08/10/2018  Hearing? N N  Comment declines hearing aids -  Vision? N N  Difficulty concentrating or making decisions? N N  Walking or climbing stairs? N N  Dressing or bathing? N N  Doing errands, shopping? N -  Preparing Food and eating ? N -  Using the Toilet? N -  In the past six months, have you accidently leaked urine? N -  Do you have problems with loss of bowel control? N -  Managing your Medications? N -  Managing your Finances? N -  Housekeeping or managing your Housekeeping? N -  Some recent data might be hidden     Immunizations and Health Maintenance Immunization History  Administered Date(s) Administered  . Influenza, High Dose Seasonal PF 12/24/2016, 12/25/2017, 11/15/2018  . Influenza,inj,Quad PF,6+ Mos 11/22/2014  . Influenza-Unspecified  11/23/2014, 11/15/2018  . PFIZER SARS-COV-2 Vaccination 03/31/2019, 04/21/2019  . Pneumococcal Conjugate-13 12/31/2017  . Pneumococcal Polysaccharide-23 01/31/2019   There are no preventive care reminders to display for this patient.  Patient Care Team: Juline Patch, MD as PCP - General (Family Medicine)  Indicate any recent Medical Services you may have received from other than [facility] providers in the past year (date may be approximate).    Assessment:   This is a routine wellness examination for [place].  Hearing/Vision screen  Hearing Screening   125Hz  250Hz  500Hz  1000Hz  2000Hz  3000Hz  4000Hz  6000Hz  8000Hz   Right ear:           Left ear:           Comments: Pt denies hearing difficulty  Vision Screening Comments: Annual vision screenings done at [facility]  Dietary issues and exercise activities discussed: Current Exercise Habits: Home exercise routine, Type of exercise: walking, Time (Minutes): 20, Frequency (Times/Week): 2, Weekly Exercise (Minutes/Week): 40, Intensity: Mild, Exercise limited by: None identified  Goals    . DIET - INCREASE WATER INTAKE     Recommend drinking 6-8 glasses of water per day      Depression Screen PHQ 2/9 Scores 07/27/2019 06/20/2019 01/31/2019 07/21/2018  PHQ - 2 Score 0 0 0 0  PHQ- 9 Score - 0 2 0    Fall Risk Fall Risk  07/27/2019 06/20/2019 07/21/2018 12/31/2017 09/11/2016  Falls in the past year? 0 0 0 No No  Number falls in past yr: 0 - - - -  Injury with Fall? 0 - - - -  Risk for fall due to : No Fall Risks - - - -  Follow up Falls prevention discussed Falls evaluation completed - - -    FALL RISK PREVENTION PERTAINING TO THE HOME:  Any stairs in or around the home? Yes  If so, do they handrails? No  - 1 step outside  Home free of loose throw rugs in walkways, pet beds, electrical cords, etc? Yes  Adequate lighting in your home to reduce risk of falls? Yes   ASSISTIVE DEVICES UTILIZED TO PREVENT FALLS:  Life alert? No    Use of a cane, walker or w/c? No  Grab bars in the bathroom? Yes  Shower chair or bench in shower? Yes  Elevated toilet seat or a handicapped toilet? Yes   DME ORDERS:  DME order needed?  No   TIMED UP AND GO:  Was the test performed? No . Telephonic visit.  Education: Fall risk prevention has been discussed.  Intervention(s) required? No    Cognitive Function:     6CIT Screen 07/27/2019  What Year? 0 points  What month? 0 points  What time? 0 points  Count back from 20 0 points  Months in reverse 4 points  Repeat phrase 0 points  Total Score 4    Screening Tests Health Maintenance  Topic Date Due  . TETANUS/TDAP  01/31/2020 (Originally 01/08/1960)  . INFLUENZA VACCINE  10/23/2019  . COVID-19 Vaccine  Completed  . PNA vac Low Risk Adult  Completed    Qualifies for Shingles Vaccine? Yes . Due for Shingrix. Education has been provided regarding the importance of this vaccine. Pt has been advised to call insurance company to determine out of pocket expense. Advised may also receive vaccine at local pharmacy or Health Dept. Verbalized acceptance and understanding.  Tdap: Although this vaccine is not a covered service during a Wellness Exam, does the patient still wish to receive this vaccine today?  No .  Education has been provided regarding the importance of this vaccine. Advised may receive this vaccine at local pharmacy or Health Dept. Aware to provide a copy of the vaccination record if obtained from local pharmacy or Health Dept. Verbalized acceptance and understanding.  Flu Vaccine: Up to date  Pneumococcal Vaccine: Up to date  Covid-19 Vaccine: Up to date   Cancer Screenings:  Colorectal Screening: Completed 02/23/17. Repeat every 5 years;   Lung Cancer Screening: (Low Dose CT Chest recommended if Age 19-80 years, 30 pack-year currently smoking OR have quit w/in 15years.) does not qualify.    Additional Screening:  Hepatitis C Screening: no longer  required  Vision Screening: Recommended annual ophthalmology exams for early detection of glaucoma and other disorders of the eye. Is the patient up to date with their annual eye exam?  Yes  Who is the provider or what is the name of the office in which the pt attends annual eye exams? [place] Screening: Recommended annual dental exams for proper oral hygiene  Community Resource Referral:  CRR required this visit?  No       Plan:    I have personally reviewed and addressed the Medicare Annual Wellness questionnaire and have noted the following in the patient's chart:  A. Medical and social history B. Use of alcohol, tobacco or illicit drugs  C. Current medications and supplements D. Functional ability and status E.  Nutritional status F.  Physical activity G. Advance directives H. List of other physicians I.  Hospitalizations, surgeries, and ER visits in previous 12 months J.  [place] such as hearing and vision if needed, cognitive and depression L. Referrals and appointments   In addition, I have reviewed and discussed with patient certain preventive protocols, quality metrics, and best practice recommendations. A written personalized care plan for preventive services as well as general preventive health recommendations were provided to patient.   Signed,  Clemetine Marker, LPN Nurse Health Advisor   Nurse Notes: none

## 2019-07-27 NOTE — Patient Instructions (Signed)
Mr. Stuart Jenkins , Thank you for taking time to come for your Medicare Wellness Visit. I appreciate your ongoing commitment to your health goals. Please review the following plan we discussed and let me know if I can assist you in the future.   Screening recommendations/referrals: Colonoscopy: done 02/23/17 Recommended yearly ophthalmology/optometry visit for glaucoma screening and checkup Recommended yearly dental visit for hygiene and checkup  Vaccinations: Influenza vaccine: done 11/15/18 Pneumococcal vaccine: done 01/31/19 Tdap vaccine: due Shingles vaccine: Shingrix discussed. Please contact your pharmacy for coverage information.  Covid-19: done 03/31/19 & 04/21/19  Advanced directives: Please bring a copy of your health care power of attorney and living will to the office at your convenience once you have completed those documents.   Conditions/risks identified: Recommend drinking 6-8 glasses of water per day  Next appointment: Please follow up in one year for your Medicare Annual Wellness visit.    Preventive Care 79 Years and Older, Male Preventive care refers to lifestyle choices and visits with your health care Kel Senn that can promote health and wellness. What does preventive care include?  A yearly physical exam. This is also called an annual well check.  Dental exams once or twice a year.  Routine eye exams. Ask your health care Anthony Tamburo how often you should have your eyes checked.  Personal lifestyle choices, including:  Daily care of your teeth and gums.  Regular physical activity.  Eating a healthy diet.  Avoiding tobacco and drug use.  Limiting alcohol use.  Practicing safe sex.  Taking low doses of aspirin every day.  Taking vitamin and mineral supplements as recommended by your health care Milanie Rosenfield. What happens during an annual well check? The services and screenings done by your health care Jermane Brayboy during your annual well check will depend on your age,  overall health, lifestyle risk factors, and family history of disease. Counseling  Your health care Aidan Caloca may ask you questions about your:  Alcohol use.  Tobacco use.  Drug use.  Emotional well-being.  Home and relationship well-being.  Sexual activity.  Eating habits.  History of falls.  Memory and ability to understand (cognition).  Work and work Statistician. Screening  You may have the following tests or measurements:  Height, weight, and BMI.  Blood pressure.  Lipid and cholesterol levels. These may be checked every 5 years, or more frequently if you are over 79 years old.  Skin check.  Lung cancer screening. You may have this screening every year starting at age 38 if you have a 30-pack-year history of smoking and currently smoke or have quit within the past 15 years.  Fecal occult blood test (FOBT) of the stool. You may have this test every year starting at age 16.  Flexible sigmoidoscopy or colonoscopy. You may have a sigmoidoscopy every 5 years or a colonoscopy every 10 years starting at age 28.  Prostate cancer screening. Recommendations will vary depending on your family history and other risks.  Hepatitis C blood test.  Hepatitis B blood test.  Sexually transmitted disease (STD) testing.  Diabetes screening. This is done by checking your blood sugar (glucose) after you have not eaten for a while (fasting). You may have this done every 1-3 years.  Abdominal aortic aneurysm (AAA) screening. You may need this if you are a current or former smoker.  Osteoporosis. You may be screened starting at age 45 if you are at high risk. Talk with your health care Estevon Fluke about your test results, treatment options, and if necessary,  the need for more tests. Vaccines  Your health care Lindbergh Winkles may recommend certain vaccines, such as:  Influenza vaccine. This is recommended every year.  Tetanus, diphtheria, and acellular pertussis (Tdap, Td) vaccine. You may  need a Td booster every 10 years.  Zoster vaccine. You may need this after age 13.  Pneumococcal 13-valent conjugate (PCV13) vaccine. One dose is recommended after age 29.  Pneumococcal polysaccharide (PPSV23) vaccine. One dose is recommended after age 7. Talk to your health care Astria Jordahl about which screenings and vaccines you need and how often you need them. This information is not intended to replace advice given to you by your health care Cinthya Bors. Make sure you discuss any questions you have with your health care Fue Cervenka. Document Released: 04/06/2015 Document Revised: 11/28/2015 Document Reviewed: 01/09/2015 Elsevier Interactive Patient Education  2017 [place] Prevention in the Home Falls can cause injuries. They can happen to people of all ages. There are many things you can do to make your home safe and to help prevent falls. What can I do on the outside of my home?  Regularly fix the edges of walkways and driveways and fix any cracks.  Remove anything that might make you trip as you walk through a door, such as a raised step or threshold.  Trim any bushes or trees on the path to your home.  Use bright outdoor lighting.  Clear any walking paths of anything that might make someone trip, such as rocks or tools.  Regularly check to see if handrails are loose or broken. Make sure that both sides of any steps have handrails.  Any raised decks and porches should have guardrails on the edges.  Have any leaves, snow, or ice cleared regularly.  Use sand or salt on walking paths during winter.  Clean up any spills in your garage right away. This includes oil or grease spills. What can I do in the bathroom?  Use night lights.  Install grab bars by the toilet and in the tub and shower. Do not use towel bars as grab bars.  Use non-skid mats or decals in the tub or shower.  If you need to sit down in the shower, use a plastic, non-slip stool.  Keep the floor  dry. Clean up any water that spills on the floor as soon as it happens.  Remove soap buildup in the tub or shower regularly.  Attach bath mats securely with double-sided non-slip rug tape.  Do not have throw rugs and other things on the floor that can make you trip. What can I do in the bedroom?  Use night lights.  Make sure that you have a light by your bed that is easy to reach.  Do not use any sheets or blankets that are too big for your bed. They should not hang down onto the floor.  Have a firm chair that has side arms. You can use this for support while you get dressed.  Do not have throw rugs and other things on the floor that can make you trip. What can I do in the kitchen?  Clean up any spills right away.  Avoid walking on wet floors.  Keep items that you use a lot in easy-to-reach places.  If you need to reach something above you, use a strong step stool that has a grab bar.  Keep electrical cords out of the way.  Do not use floor polish or wax that makes floors slippery. If you must use  wax, use non-skid floor wax.  Do not have throw rugs and other things on the floor that can make you trip. What can I do with my stairs?  Do not leave any items on the stairs.  Make sure that there are handrails on both sides of the stairs and use them. Fix handrails that are broken or loose. Make sure that handrails are as long as the stairways.  Check any carpeting to make sure that it is firmly attached to the stairs. Fix any carpet that is loose or worn.  Avoid having throw rugs at the top or bottom of the stairs. If you do have throw rugs, attach them to the floor with carpet tape.  Make sure that you have a light switch at the top of the stairs and the bottom of the stairs. If you do not have them, ask someone to add them for you. What else can I do to help prevent falls?  Wear shoes that:  Do not have high heels.  Have rubber bottoms.  Are comfortable and fit you  well.  Are closed at the toe. Do not wear sandals.  If you use a stepladder:  Make sure that it is fully opened. Do not climb a closed stepladder.  Make sure that both sides of the stepladder are locked into place.  Ask someone to hold it for you, if possible.  Clearly mark and make sure that you can see:  Any grab bars or handrails.  First and last steps.  Where the edge of each step is.  Use tools that help you move around (mobility aids) if they are needed. These include:  Canes.  Walkers.  Scooters.  Crutches.  Turn on the lights when you go into a dark area. Replace any light bulbs as soon as they burn out.  Set up your furniture so you have a clear path. Avoid moving your furniture around.  If any of your floors are uneven, fix them.  If there are any pets around you, be aware of where they are.  Review your medicines with your doctor. Some medicines can make you feel dizzy. This can increase your chance of falling. Ask your doctor what other things that you can do to help prevent falls. This information is not intended to replace advice given to you by your health care Clenton Esper. Make sure you discuss any questions you have with your health care Meya Clutter. Document Released: 01/04/2009 Document Revised: 08/16/2015 Document Reviewed: 04/14/2014 Elsevier Interactive Patient Education  2017 [employer].

## 2019-08-02 ENCOUNTER — Ambulatory Visit (INDEPENDENT_AMBULATORY_CARE_PROVIDER_SITE_OTHER): Payer: Medicare HMO | Admitting: Family Medicine

## 2019-08-02 ENCOUNTER — Other Ambulatory Visit: Payer: Self-pay

## 2019-08-02 ENCOUNTER — Encounter: Payer: Self-pay | Admitting: Family Medicine

## 2019-08-02 VITALS — BP 120/70 | HR 68 | Ht 71.0 in | Wt 167.0 lb

## 2019-08-02 DIAGNOSIS — I10 Essential (primary) hypertension: Secondary | ICD-10-CM | POA: Diagnosis not present

## 2019-08-02 DIAGNOSIS — L989 Disorder of the skin and subcutaneous tissue, unspecified: Secondary | ICD-10-CM

## 2019-08-02 DIAGNOSIS — R69 Illness, unspecified: Secondary | ICD-10-CM | POA: Diagnosis not present

## 2019-08-02 DIAGNOSIS — F5102 Adjustment insomnia: Secondary | ICD-10-CM | POA: Diagnosis not present

## 2019-08-02 DIAGNOSIS — N401 Enlarged prostate with lower urinary tract symptoms: Secondary | ICD-10-CM | POA: Diagnosis not present

## 2019-08-02 DIAGNOSIS — E782 Mixed hyperlipidemia: Secondary | ICD-10-CM | POA: Diagnosis not present

## 2019-08-02 MED ORDER — TAMSULOSIN HCL 0.4 MG PO CAPS: 0.4000 mg | ORAL_CAPSULE | Freq: Every day | ORAL | 1 refills | Status: DC

## 2019-08-02 MED ORDER — AMLODIPINE BESYLATE 5 MG PO TABS: 5.0000 mg | ORAL_TABLET | Freq: Every day | ORAL | 1 refills | Status: DC

## 2019-08-02 MED ORDER — TRAZODONE HCL 50 MG PO TABS: ORAL_TABLET | ORAL | 0 refills | Status: DC

## 2019-08-02 MED ORDER — ATORVASTATIN CALCIUM 40 MG PO TABS: 40.0000 mg | ORAL_TABLET | Freq: Every day | ORAL | 1 refills | Status: DC

## 2019-08-02 MED ORDER — METOPROLOL SUCCINATE ER 25 MG PO TB24: ORAL_TABLET | ORAL | 1 refills | Status: DC

## 2019-08-02 NOTE — Progress Notes (Signed)
Date:  08/02/2019   Name:  Stuart Jenkins   DOB:  12/19/1940   MRN:  [id number]   Chief Complaint: Hypertension, Hyperlipidemia, Benign Prostatic Hypertrophy, and Insomnia  Hypertension This is a chronic problem. The current episode started more than 1 year ago. The problem has been gradually improving since onset. The problem is controlled. Pertinent negatives include no anxiety, blurred vision, chest pain, headaches, malaise/fatigue, neck pain, orthopnea, palpitations, peripheral edema, PND, shortness of breath or sweats. There are no associated agents to hypertension. Risk factors for coronary artery disease include dyslipidemia and post-menopausal state. Past treatments include calcium channel blockers and beta blockers. The current treatment provides moderate improvement. There are no compliance problems.  There is no history of angina, kidney disease, CAD/MI, CVA, heart failure, left ventricular hypertrophy, PVD or retinopathy. There is no history of chronic renal disease, a hypertension causing med or renovascular disease.  Hyperlipidemia This is a chronic problem. The current episode started more than 1 year ago. The problem is controlled. Recent lipid tests were reviewed and are normal. He has no history of chronic renal disease, diabetes, hypothyroidism, liver disease, obesity or nephrotic syndrome. Pertinent negatives include no chest pain, focal sensory loss, focal weakness, leg pain, myalgias or shortness of breath. Current antihyperlipidemic treatment includes statins. The current treatment provides moderate improvement of lipids. There are no compliance problems.  Risk factors for coronary artery disease include dyslipidemia and hypertension.  Benign Prostatic Hypertrophy This is a chronic problem. The current episode started more than 1 year ago. The problem has been gradually improving since onset. Irritative symptoms include nocturia. Irritative symptoms do not include frequency  or urgency. Obstructive symptoms do not include dribbling, incomplete emptying, an intermittent stream, a slower stream or a weak stream. Pertinent negatives include no chills, dysuria, hematuria or nausea. Past treatments include tamsulosin. The treatment provided moderate relief.  Insomnia Primary symptoms: no fragmented sleep, no difficulty falling asleep, no malaise/fatigue.  The problem occurs intermittently.    Lab Results  Component Value Date   CREATININE 1.09 01/31/2019   BUN 14 01/31/2019   NA 137 01/31/2019   K 4.5 01/31/2019   CL 101 01/31/2019   CO2 23 01/31/2019   Lab Results  Component Value Date   CHOL 90 (L) 01/31/2019   HDL 29 (L) 01/31/2019   LDLCALC 38 01/31/2019   TRIG 128 01/31/2019   CHOLHDL 3.1 01/31/2019   No results found for: TSH Lab Results  Component Value Date   HGBA1C 5.5 12/24/2014   Lab Results  Component Value Date   WBC 4.3 12/23/2014   HGB 9.9 (L) 12/24/2014   HCT 31.7 (L) 12/23/2014   MCV 90.5 12/23/2014   PLT 384 12/23/2014   Lab Results  Component Value Date   ALT 40 07/21/2018   AST 36 07/21/2018   ALKPHOS 97 07/21/2018   BILITOT 0.6 07/21/2018     Review of Systems  Constitutional: Negative for chills, fever and malaise/fatigue.  HENT: Negative for drooling, ear discharge, ear pain and sore throat.   Eyes: Negative for blurred vision.  Respiratory: Negative for cough, shortness of breath and wheezing.   Cardiovascular: Negative for chest pain, palpitations, orthopnea, leg swelling and PND.  Gastrointestinal: Negative for abdominal pain, blood in stool, constipation, diarrhea and nausea.  Endocrine: Negative for polydipsia.  Genitourinary: Positive for nocturia. Negative for dysuria, frequency, hematuria, incomplete emptying and urgency.  Musculoskeletal: Negative for back pain, myalgias and neck pain.  Skin: Negative for rash.  Allergic/Immunologic: Negative for environmental allergies.  Neurological: Negative for  dizziness, focal weakness and headaches.  Hematological: Does not bruise/bleed easily.  Psychiatric/Behavioral: Negative for suicidal ideas. The patient has insomnia. The patient is not nervous/anxious.     Patient Active Problem List   Diagnosis Date Noted  . Pain in limb 02/12/2018  . Coronary artery disease involving coronary bypass graft of native heart without angina pectoris 07/30/2017  . Mixed hyperlipidemia 09/11/2016  . Benign prostatic hyperplasia with lower urinary tract symptoms 09/11/2016  . Hematospermia 07/10/2015  . Essential hypertension 06/12/2015  . Cervical disc disorder 04/10/2015  . Hypotension 12/24/2014  . Hyponatremia 12/24/2014  . Anemia 12/24/2014  . Hyperglycemia 12/24/2014  . Syncopal episodes 12/23/2014  . Acute postoperative pain 12/07/2014  . Cigarette smoker 12/06/2014  . Primary localized osteoarthrosis, hand 11/17/2013  . Chronic prostatitis 03/09/2012    Allergies  Allergen Reactions  . Oxycodone Hcl Other (See Comments)    Altered mental status  . Sulfa Antibiotics Rash    Past Surgical History:  Procedure Laterality Date  . APPENDECTOMY    . CARDIAC CATHETERIZATION N/A 11/08/2014   Procedure: Left Heart Cath and Coronary Angiography;  Surgeon: Yolonda Kida, MD;  Location: [place] [facility];  Service: Cardiovascular;  Laterality: N/A;  . CARDIOVERSION    . CATARACT EXTRACTION W/PHACO Right 06/01/2018   Procedure: CATARACT EXTRACTION PHACO AND INTRAOCULAR LENS PLACEMENT ([place]) RIGHT;  Surgeon: Birder Robson, MD;  Location: [facility];  Service: Ophthalmology;  Laterality: Right;  [place] 00:50.5 CDE 7.01 Fluid Pack Lot # [id number]  . CATARACT EXTRACTION W/PHACO Left 08/10/2018   Procedure: CATARACT EXTRACTION PHACO AND INTRAOCULAR LENS PLACEMENT ([place])  COMPLICATED LEFT MALYUGIN;  Surgeon: Birder Robson, MD;  Location: [place];  Service: Ophthalmology;  Laterality: Left;  MALYUGIN  . COLONOSCOPY    . COLONOSCOPY WITH  PROPOFOL N/A 02/23/2017   Procedure: COLONOSCOPY WITH PROPOFOL;  Surgeon: Lollie Sails, MD;  Location[facility];  Service: Endoscopy;  Laterality: N/A;  . CORONARY ANGIOPLASTY    . CORONARY ARTERY BYPASS GRAFT    . ELECTROPHYSIOLOGIC STUDY N/A 01/30/2015   Procedure: Cardioversion;  Surgeon: Yolonda Kida, MD;  Location: [facility] ORS;  Service: Cardiovascular;  Laterality: N/A;  . FRACTURE SURGERY Left    arm, knee, collar bone  . FRACTURE SURGERY    . GASTRIC RESECTION     partial  . open heart surgery  12/05/2014    Social History   Tobacco Use  . Smoking status: Former Smoker    Quit date: 11/07/1984    Years since quitting: 34.7  . Smokeless tobacco: Never Used  Substance Use Topics  . Alcohol use: Yes    Comment: less than 1 drink/week  . Drug use: No     Medication list has been reviewed and updated.  Current Meds  Medication Sig  . amLODipine (NORVASC) 5 MG tablet Take 1 tablet (5 mg total) by mouth daily.  Marland Kitchen aspirin EC 81 MG tablet Take 1 tablet (81 mg total) by mouth daily.  Marland Kitchen atorvastatin (LIPITOR) 40 MG tablet Take 1 tablet (40 mg total) by mouth daily.  . fluticasone (FLONASE) 50 MCG/ACT nasal spray Place 2 sprays into both nostrils daily. (Patient taking differently: Place 2 sprays into both nostrils daily. PRN)  . metoprolol succinate (TOPROL-XL) 25 MG 24 hr tablet TAKE 1 TABLET BY MOUTH EVERYDAY AT BEDTIME  . tamsulosin (FLOMAX) 0.4 MG CAPS capsule Take 1 capsule (0.4 mg total) by mouth daily.  Marland Kitchen  traZODone (DESYREL) 50 MG tablet TAKE 1/2-1 TABLETS BY MOUTH AT BEDTIME AS NEEDED FOR SLEEP.    PHQ 2/9 Scores 08/02/2019 07/27/2019 06/20/2019 01/31/2019  PHQ - 2 Score 0 0 0 0  PHQ- 9 Score 0 - 0 2    BP Readings from Last 3 Encounters:  08/02/19 120/70  06/20/19 130/88  05/09/19 128/78    Physical Exam Vitals reviewed.  HENT:     Head: Normocephalic.     Right Ear: Tympanic membrane, ear canal and external ear normal.     Left Ear: Tympanic  membrane, ear canal and external ear normal.     Nose: Nose normal. No congestion or rhinorrhea.     Mouth/Throat:     Mouth: Mucous membranes are moist.  Eyes:     General: No scleral icterus.       Right eye: No discharge.        Left eye: No discharge.     Conjunctiva/sclera: Conjunctivae normal.     Pupils: Pupils are equal, round, and reactive to light.  Neck:     Thyroid: No thyromegaly.     Vascular: No JVD.     Trachea: No tracheal deviation.  Cardiovascular:     Rate and Rhythm: Normal rate and regular rhythm.     Heart sounds: Normal heart sounds. No murmur. No friction rub. No gallop.   Pulmonary:     Effort: No respiratory distress.     Breath sounds: Normal breath sounds. No wheezing, rhonchi or rales.  Abdominal:     General: Bowel sounds are normal.     Palpations: Abdomen is soft. There is no mass.     Tenderness: There is no abdominal tenderness. There is no guarding or rebound.  Musculoskeletal:        General: No tenderness. Normal range of motion.     Cervical back: Normal range of motion and neck supple.  Lymphadenopathy:     Cervical: No cervical adenopathy.  Skin:    General: Skin is warm.     Capillary Refill: Capillary refill takes less than 2 seconds.     Findings: Erythema and rash present. Rash is papular.     Comments: Right posterior colf  Neurological:     Mental Status: He is alert and oriented to person, place, and time.     Cranial Nerves: No cranial nerve deficit.     Deep Tendon Reflexes: Reflexes are normal and symmetric.     Wt Readings from Last 3 Encounters:  08/02/19 167 lb (75.8 kg)  07/27/19 168 lb (76.2 kg)  06/20/19 162 lb (73.5 kg)    BP 120/70   Pulse 68   Ht 5\' 11"  (1.803 m)   Wt 167 lb (75.8 kg)   BMI 23.29 kg/m   Assessment and Plan 1. Adjustment insomnia Chronic.  Persistent.  Episodic.  Patient is doing well on current dosing of trazodone 1/2 tablet nightly as needed for sleep with an occasional repeat of the  other half if not asleep by within an hour. - traZODone (DESYREL) 50 MG tablet; TAKE 1/2-1 TABLETS BY MOUTH AT BEDTIME AS NEEDED FOR SLEEP.  Dispense: 90 tablet; Refill: 0  2. Benign prostatic hyperplasia with lower urinary tract symptoms, symptom details unspecified Chronic.  Controlled.  Stable.  Except for occasional nocturia patient is doing well on current dosing of tamsulosin 0.4 mg daily. - tamsulosin (FLOMAX) 0.4 MG CAPS capsule; Take 1 capsule (0.4 mg total) by mouth daily.  Dispense: 90 capsule;  Refill: 1  3. Essential hypertension Chronic.  Controlled.  Stable.  Continue metoprolol XL 25 mg once a day amlodipine 5 mg once a day.  Reviewed renal panel from previous which is in acceptable limits and will repeat in 6 months renal function panel. - metoprolol succinate (TOPROL-XL) 25 MG 24 hr tablet; One a day  Dispense: 90 tablet; Refill: 1 - amLODipine (NORVASC) 5 MG tablet; Take 1 tablet (5 mg total) by mouth daily.  Dispense: 90 tablet; Refill: 1  4. Mixed hyperlipidemia Chronic.  Controlled.  Stable.  Continue atorvastatin 40 mg once a day.  Review of previous lipid panel was acceptable and will be repeated in 6 months. - atorvastatin (LIPITOR) 40 MG tablet; Take 1 tablet (40 mg total) by mouth daily.  Dispense: 90 tablet; Refill: 1  5. Leg skin lesion, right New onset patient has noticed an area on the back of his leg that is raised erythematous with some iridescent's.  This could be consistent with a basal cell although it may be a dermatofibroma.  We will proceed with dermatology consult for evaluation and possible treatment. - Ambulatory referral to Dermatology

## 2019-08-25 DIAGNOSIS — L821 Other seborrheic keratosis: Secondary | ICD-10-CM | POA: Diagnosis not present

## 2019-08-25 DIAGNOSIS — L570 Actinic keratosis: Secondary | ICD-10-CM | POA: Diagnosis not present

## 2019-08-25 DIAGNOSIS — D485 Neoplasm of uncertain behavior of skin: Secondary | ICD-10-CM | POA: Diagnosis not present

## 2019-08-25 DIAGNOSIS — C44722 Squamous cell carcinoma of skin of right lower limb, including hip: Secondary | ICD-10-CM | POA: Diagnosis not present

## 2019-09-05 DIAGNOSIS — I872 Venous insufficiency (chronic) (peripheral): Secondary | ICD-10-CM | POA: Diagnosis not present

## 2019-09-08 DIAGNOSIS — C44722 Squamous cell carcinoma of skin of right lower limb, including hip: Secondary | ICD-10-CM | POA: Diagnosis not present

## 2019-09-29 DIAGNOSIS — C4442 Squamous cell carcinoma of skin of scalp and neck: Secondary | ICD-10-CM | POA: Diagnosis not present

## 2019-09-29 DIAGNOSIS — L905 Scar conditions and fibrosis of skin: Secondary | ICD-10-CM | POA: Diagnosis not present

## 2019-09-29 DIAGNOSIS — D485 Neoplasm of uncertain behavior of skin: Secondary | ICD-10-CM | POA: Diagnosis not present

## 2019-10-25 DIAGNOSIS — C4442 Squamous cell carcinoma of skin of scalp and neck: Secondary | ICD-10-CM | POA: Diagnosis not present

## 2019-12-19 DIAGNOSIS — Z951 Presence of aortocoronary bypass graft: Secondary | ICD-10-CM | POA: Diagnosis not present

## 2019-12-19 DIAGNOSIS — E782 Mixed hyperlipidemia: Secondary | ICD-10-CM | POA: Diagnosis not present

## 2019-12-19 DIAGNOSIS — I2510 Atherosclerotic heart disease of native coronary artery without angina pectoris: Secondary | ICD-10-CM | POA: Diagnosis not present

## 2019-12-19 DIAGNOSIS — I10 Essential (primary) hypertension: Secondary | ICD-10-CM | POA: Diagnosis not present

## 2019-12-19 DIAGNOSIS — K279 Peptic ulcer, site unspecified, unspecified as acute or chronic, without hemorrhage or perforation: Secondary | ICD-10-CM | POA: Diagnosis not present

## 2019-12-19 DIAGNOSIS — Z720 Tobacco use: Secondary | ICD-10-CM | POA: Diagnosis not present

## 2019-12-20 ENCOUNTER — Other Ambulatory Visit: Payer: Self-pay

## 2019-12-20 ENCOUNTER — Ambulatory Visit (INDEPENDENT_AMBULATORY_CARE_PROVIDER_SITE_OTHER): Payer: Medicare HMO | Admitting: Family Medicine

## 2019-12-20 ENCOUNTER — Encounter: Payer: Self-pay | Admitting: Family Medicine

## 2019-12-20 VITALS — BP 130/80 | HR 64 | Ht 71.0 in | Wt 157.0 lb

## 2019-12-20 DIAGNOSIS — B159 Hepatitis A without hepatic coma: Secondary | ICD-10-CM | POA: Diagnosis not present

## 2019-12-20 DIAGNOSIS — N401 Enlarged prostate with lower urinary tract symptoms: Secondary | ICD-10-CM | POA: Diagnosis not present

## 2019-12-20 DIAGNOSIS — F5102 Adjustment insomnia: Secondary | ICD-10-CM

## 2019-12-20 DIAGNOSIS — E782 Mixed hyperlipidemia: Secondary | ICD-10-CM

## 2019-12-20 DIAGNOSIS — R69 Illness, unspecified: Secondary | ICD-10-CM | POA: Diagnosis not present

## 2019-12-20 DIAGNOSIS — Z23 Encounter for immunization: Secondary | ICD-10-CM | POA: Diagnosis not present

## 2019-12-20 DIAGNOSIS — I10 Essential (primary) hypertension: Secondary | ICD-10-CM | POA: Diagnosis not present

## 2019-12-20 DIAGNOSIS — Z205 Contact with and (suspected) exposure to viral hepatitis: Secondary | ICD-10-CM

## 2019-12-20 MED ORDER — AMLODIPINE BESYLATE 5 MG PO TABS: 5.0000 mg | ORAL_TABLET | Freq: Every day | ORAL | 1 refills | Status: DC

## 2019-12-20 MED ORDER — TRAZODONE HCL 50 MG PO TABS: ORAL_TABLET | ORAL | 1 refills | Status: DC

## 2019-12-20 MED ORDER — METOPROLOL SUCCINATE ER 25 MG PO TB24: ORAL_TABLET | ORAL | 1 refills | Status: DC

## 2019-12-20 MED ORDER — TAMSULOSIN HCL 0.4 MG PO CAPS: 0.4000 mg | ORAL_CAPSULE | Freq: Every day | ORAL | 1 refills | Status: DC

## 2019-12-20 MED ORDER — ATORVASTATIN CALCIUM 40 MG PO TABS: 40.0000 mg | ORAL_TABLET | Freq: Every day | ORAL | 1 refills | Status: DC

## 2019-12-20 NOTE — Progress Notes (Signed)
Date:  12/20/2019   Name:  Stuart Jenkins   DOB:  05/06/1940   MRN:  [id number]   Chief Complaint: Hyperlipidemia, Hypertension, Benign Prostatic Hypertrophy, Insomnia, and Flu Vaccine  Patient is a 79 year old male who presents for a exposure to hepatitis exam. The patient reports the following problems: no sx. Health maintenance has been reviewed up to date.  Hyperlipidemia This is a chronic problem. The current episode started more than 1 year ago. The problem is controlled. Recent lipid tests were reviewed and are normal. He has no history of chronic renal disease, diabetes, hypothyroidism, liver disease, obesity or nephrotic syndrome. There are no known factors aggravating his hyperlipidemia. Pertinent negatives include no chest pain, focal sensory loss, focal weakness, leg pain, myalgias or shortness of breath. He is currently on no antihyperlipidemic treatment. The current treatment provides moderate improvement of lipids. There are no compliance problems.   Hypertension This is a chronic problem. The current episode started more than 1 year ago. The problem has been gradually improving since onset. The problem is controlled. Pertinent negatives include no anxiety, blurred vision, chest pain, headaches, malaise/fatigue, neck pain, orthopnea, palpitations, peripheral edema, PND, shortness of breath or sweats. There are no associated agents to hypertension. There are no known risk factors for coronary artery disease. Past treatments include calcium channel blockers and beta blockers. The current treatment provides moderate improvement. There are no compliance problems.  There is no history of angina, kidney disease, CAD/MI, CVA, heart failure, left ventricular hypertrophy, PVD or retinopathy. There is no history of chronic renal disease, a hypertension causing med or renovascular disease.  Benign Prostatic Hypertrophy This is a chronic problem. The problem has been gradually improving  since onset. Irritative symptoms include nocturia. Irritative symptoms do not include frequency or urgency. Obstructive symptoms do not include dribbling, incomplete emptying, an intermittent stream, a slower stream, straining or a weak stream. Pertinent negatives include no chills, dysuria, genital pain, hematuria, hesitancy, nausea or vomiting. Past treatments include tamsulosin. The treatment provided moderate relief.  Insomnia Primary symptoms: fragmented sleep, no sleep disturbance, difficulty falling asleep, no somnolence, no frequent awakening, no premature morning awakening, no malaise/fatigue, no napping.   The onset quality is gradual.    Lab Results  Component Value Date   CREATININE 1.09 01/31/2019   BUN 14 01/31/2019   NA 137 01/31/2019   K 4.5 01/31/2019   CL 101 01/31/2019   CO2 23 01/31/2019   Lab Results  Component Value Date   CHOL 90 (L) 01/31/2019   HDL 29 (L) 01/31/2019   LDLCALC 38 01/31/2019   TRIG 128 01/31/2019   CHOLHDL 3.1 01/31/2019   No results found for: TSH Lab Results  Component Value Date   HGBA1C 5.5 12/24/2014   Lab Results  Component Value Date   WBC 4.3 12/23/2014   HGB 9.9 (L) 12/24/2014   HCT 31.7 (L) 12/23/2014   MCV 90.5 12/23/2014   PLT 384 12/23/2014   Lab Results  Component Value Date   ALT 40 07/21/2018   AST 36 07/21/2018   ALKPHOS 97 07/21/2018   BILITOT 0.6 07/21/2018     Review of Systems  Constitutional: Negative for chills, fever and malaise/fatigue.  HENT: Negative for drooling, ear discharge, ear pain and sore throat.   Eyes: Negative for blurred vision.  Respiratory: Negative for cough, shortness of breath and wheezing.   Cardiovascular: Negative for chest pain, palpitations, orthopnea, leg swelling and PND.  Gastrointestinal: Negative for abdominal  pain, blood in stool, constipation, diarrhea, nausea and vomiting.  Endocrine: Negative for polydipsia.  Genitourinary: Positive for nocturia. Negative for dysuria,  frequency, hematuria, hesitancy, incomplete emptying and urgency.  Musculoskeletal: Negative for back pain, myalgias and neck pain.  Skin: Negative for rash.  Allergic/Immunologic: Negative for environmental allergies.  Neurological: Negative for dizziness, focal weakness and headaches.  Hematological: Does not bruise/bleed easily.  Psychiatric/Behavioral: Negative for sleep disturbance and suicidal ideas. The patient has insomnia. The patient is not nervous/anxious.     Patient Active Problem List   Diagnosis Date Noted  . Pain in limb 02/12/2018  . Coronary artery disease involving coronary bypass graft of native heart without angina pectoris 07/30/2017  . Mixed hyperlipidemia 09/11/2016  . Benign prostatic hyperplasia with lower urinary tract symptoms 09/11/2016  . Hematospermia 07/10/2015  . Essential hypertension 06/12/2015  . Cervical disc disorder 04/10/2015  . Hypotension 12/24/2014  . Hyponatremia 12/24/2014  . Anemia 12/24/2014  . Hyperglycemia 12/24/2014  . Syncopal episodes 12/23/2014  . Acute postoperative pain 12/07/2014  . Cigarette smoker 12/06/2014  . Primary localized osteoarthrosis, hand 11/17/2013  . Chronic prostatitis 03/09/2012    Allergies  Allergen Reactions  . Oxycodone Hcl Other (See Comments)    Altered mental status  . Sulfa Antibiotics Rash    Past Surgical History:  Procedure Laterality Date  . APPENDECTOMY    . CARDIAC CATHETERIZATION N/A 11/08/2014   Procedure: Left Heart Cath and Coronary Angiography;  Surgeon: Yolonda Kida, MD;  Location: [place] [facility];  Service: Cardiovascular;  Laterality: N/A;  . CARDIOVERSION    . CATARACT EXTRACTION W/PHACO Right 06/01/2018   Procedure: CATARACT EXTRACTION PHACO AND INTRAOCULAR LENS PLACEMENT ([place]) RIGHT;  Surgeon: Birder Robson, MD;  Location: [facility];  Service: Ophthalmology;  Laterality: Right;  [place] 00:50.5 CDE 7.01 Fluid Pack Lot # [id number]  . CATARACT EXTRACTION W/PHACO Left  08/10/2018   Procedure: CATARACT EXTRACTION PHACO AND INTRAOCULAR LENS PLACEMENT ([place])  COMPLICATED LEFT MALYUGIN;  Surgeon: Birder Robson, MD;  Location: [place];  Service: Ophthalmology;  Laterality: Left;  MALYUGIN  . COLONOSCOPY    . COLONOSCOPY WITH PROPOFOL N/A 02/23/2017   Procedure: COLONOSCOPY WITH PROPOFOL;  Surgeon: Lollie Sails, MD;  Location: [facility]COPY;  Service: Endoscopy;  Laterality: N/A;  . CORONARY ANGIOPLASTY    . CORONARY ARTERY BYPASS GRAFT    . ELECTROPHYSIOLOGIC STUDY N/A 01/30/2015   Procedure: Cardioversion;  Surgeon: Yolonda Kida, MD;  Location: [facility];  Service: Cardiovascular;  Laterality: N/A;  . FRACTURE SURGERY Left    arm, knee, collar bone  . FRACTURE SURGERY    . GASTRIC RESECTION     partial  . open heart surgery  12/05/2014    Social History   Tobacco Use  . Smoking status: Former Smoker    Quit date: 11/07/1984    Years since quitting: 35.1  . Smokeless tobacco: Never Used  Vaping Use  . Vaping Use: Never used  Substance Use Topics  . Alcohol use: Yes    Comment: less than 1 drink/week  . Drug use: No     Medication list has been reviewed and updated.  Current Meds  Medication Sig  . amLODipine (NORVASC) 5 MG tablet Take 1 tablet (5 mg total) by mouth daily.  Marland Kitchen aspirin EC 81 MG tablet Take 1 tablet (81 mg total) by mouth daily.  Marland Kitchen atorvastatin (LIPITOR) 40 MG tablet Take 1 tablet (40 mg total) by mouth daily.  . fluticasone (FLONASE) 50  MCG/ACT nasal spray Place 2 sprays into both nostrils daily. (Patient taking differently: Place 2 sprays into both nostrils daily. PRN)  . metoprolol succinate (TOPROL-XL) 25 MG 24 hr tablet One a day  . tamsulosin (FLOMAX) 0.4 MG CAPS capsule Take 1 capsule (0.4 mg total) by mouth daily.  . traZODone (DESYREL) 50 MG tablet TAKE 1/2-1 TABLETS BY MOUTH AT BEDTIME AS NEEDED FOR SLEEP.    PHQ 2/9 Scores 12/20/2019 08/02/2019 07/27/2019 06/20/2019  PHQ - 2 Score 0 0 0 0  PHQ- 9  Score 2 0 - 0    GAD 7 : Generalized Anxiety Score 12/20/2019 08/02/2019 06/20/2019 01/31/2019  Nervous, Anxious, on Edge 0 0 0 0  Control/stop worrying 2 0 0 0  Worry too much - different things 2 0 0 2  Trouble relaxing 0 0 0 0  Restless 0 0 0 0  Easily annoyed or irritable 0 0 0 0  Afraid - awful might happen 0 0 0 0  Total GAD 7 Score 4 0 0 2  Anxiety Difficulty Somewhat difficult - - Not difficult at all    BP Readings from Last 3 Encounters:  12/20/19 130/80  08/02/19 120/70  06/20/19 130/88    Physical Exam Vitals and nursing note reviewed.  HENT:     Head: Normocephalic.     Right Ear: Tympanic membrane, ear canal and external ear normal.     Left Ear: Tympanic membrane, ear canal and external ear normal.     Nose: Nose normal.  Eyes:     General: No scleral icterus.       Right eye: No discharge.        Left eye: No discharge.     Conjunctiva/sclera: Conjunctivae normal.     Pupils: Pupils are equal, round, and reactive to light.  Neck:     Thyroid: No thyromegaly.     Vascular: No JVD.     Trachea: No tracheal deviation.  Cardiovascular:     Rate and Rhythm: Normal rate and regular rhythm.     Heart sounds: Normal heart sounds, S1 normal and S2 normal. No murmur heard.  No systolic murmur is present.  No diastolic murmur is present.  No friction rub. No gallop. No S3 sounds.   Pulmonary:     Effort: No respiratory distress.     Breath sounds: Normal breath sounds. No decreased air movement. No decreased breath sounds, wheezing, rhonchi or rales.  Abdominal:     General: Bowel sounds are normal.     Palpations: Abdomen is soft. There is no mass.     Tenderness: There is no abdominal tenderness. There is no guarding or rebound.  Musculoskeletal:        General: No tenderness. Normal range of motion.     Cervical back: Normal range of motion and neck supple.  Lymphadenopathy:     Cervical: No cervical adenopathy.  Skin:    General: Skin is warm.      Findings: No rash.  Neurological:     Mental Status: He is alert and oriented to person, place, and time.     Cranial Nerves: No cranial nerve deficit.     Deep Tendon Reflexes: Reflexes are normal and symmetric.     Wt Readings from Last 3 Encounters:  12/20/19 157 lb (71.2 kg)  08/02/19 167 lb (75.8 kg)  07/27/19 168 lb (76.2 kg)    BP 130/80   Pulse 64   Ht 5\' 11"  (1.803 m)  Wt 157 lb (71.2 kg)   BMI 21.90 kg/m   Assessment and Plan: 1. Essential hypertension Chronic.  Controlled.  Stable.  Patient will continue metoprolol XL 25 mg once a day and amlodipine 5 mg once a day.  Will check CMP in particular we will also be checking his transaminases as noted below. - Comprehensive Metabolic Panel (CMET) - metoprolol succinate (TOPROL-XL) 25 MG 24 hr tablet; One a day  Dispense: 90 tablet; Refill: 1 - amLODipine (NORVASC) 5 MG tablet; Take 1 tablet (5 mg total) by mouth daily.  Dispense: 90 tablet; Refill: 1  2. Adjustment insomnia Chronic.  Controlled.  Stable.  Continue trazodone 50 mg 1/2-1 nightly as needed for sleep. - traZODone (DESYREL) 50 MG tablet; TAKE 1/2-1 TABLETS BY MOUTH AT BEDTIME AS NEEDED FOR SLEEP.  Dispense: 90 tablet; Refill: 1  3. Benign prostatic hyperplasia with lower urinary tract symptoms, symptom details unspecified Chronic.  Controlled.  Stable.  No symptoms of hesitancy urgency nor slow stream.  Patient will continue tamsulosin 0.4 mg daily.  Recheck of previous PSA is acceptable. - tamsulosin (FLOMAX) 0.4 MG CAPS capsule; Take 1 capsule (0.4 mg total) by mouth daily.  Dispense: 90 capsule; Refill: 1  4. Mixed hyperlipidemia Chronic.  Controlled.  Stable.  Continue atorvastatin 40 mg once a day. - Lipid Panel With LDL/HDL Ratio - atorvastatin (LIPITOR) 40 MG tablet; Take 1 tablet (40 mg total) by mouth daily.  Dispense: 90 tablet; Refill: 1  5. Exposure to hepatitis A New onset with patient having a spouse that has a positive hepatitis A antibody  which does not necessarily mean that she had an infection at the time but at some point in time for his exposed hepatitis A.  Patient has concerns and we would get an examination is check for above and will check for hepatitis A antibody per her request. - Hepatitis A Ab, Total  6. Need for immunization against influenza Discussed and administered - Flu Vaccine QUAD High Dose(Fluad)

## 2019-12-21 LAB — COMPREHENSIVE METABOLIC PANEL
ALT: 42 IU/L (ref 0–44)
AST: 37 IU/L (ref 0–40)
Albumin/Globulin Ratio: 1.7 (ref 1.2–2.2)
Albumin: 4.4 g/dL (ref 3.7–4.7)
Alkaline Phosphatase: 99 IU/L (ref 44–121)
BUN/Creatinine Ratio: 13 (ref 10–24)
BUN: 13 mg/dL (ref 8–27)
Bilirubin Total: 0.5 mg/dL (ref 0.0–1.2)
CO2: 24 mmol/L (ref 20–29)
Calcium: 9.8 mg/dL (ref 8.6–10.2)
Chloride: 101 mmol/L (ref 96–106)
Creatinine, Ser: 1.03 mg/dL (ref 0.76–1.27)
GFR calc Af Amer: 80 mL/min/{1.73_m2} (ref >59)
GFR calc non Af Amer: 69 mL/min/{1.73_m2} (ref >59)
Globulin, Total: 2.6 g/dL (ref 1.5–4.5)
Glucose: 100 mg/dL — ABNORMAL HIGH (ref 65–99)
Potassium: 4.5 mmol/L (ref 3.5–5.2)
Sodium: 139 mmol/L (ref 134–144)
Total Protein: 7 g/dL (ref 6.0–8.5)

## 2019-12-21 LAB — LIPID PANEL WITH LDL/HDL RATIO
Cholesterol, Total: 88 mg/dL — ABNORMAL LOW (ref 100–199)
HDL: 29 mg/dL — ABNORMAL LOW (ref >39)
LDL Chol Calc (NIH): 32 mg/dL (ref 0–99)
LDL/HDL Ratio: 1.1 ratio (ref 0.0–3.6)
Triglycerides: 163 mg/dL — ABNORMAL HIGH (ref 0–149)
VLDL Cholesterol Cal: 27 mg/dL (ref 5–40)

## 2019-12-21 LAB — HEPATITIS A ANTIBODY, TOTAL: hep A Total Ab: POSITIVE — AB

## 2019-12-26 LAB — HEPATITIS A ANTIBODY, IGM: Hep A IgM: NEGATIVE

## 2019-12-26 LAB — SPECIMEN STATUS REPORT

## 2020-01-12 ENCOUNTER — Ambulatory Visit: Payer: Medicare HMO | Admitting: Family Medicine

## 2020-01-18 DIAGNOSIS — Z951 Presence of aortocoronary bypass graft: Secondary | ICD-10-CM | POA: Diagnosis not present

## 2020-01-18 DIAGNOSIS — I2510 Atherosclerotic heart disease of native coronary artery without angina pectoris: Secondary | ICD-10-CM | POA: Diagnosis not present

## 2020-02-07 DIAGNOSIS — Z951 Presence of aortocoronary bypass graft: Secondary | ICD-10-CM | POA: Diagnosis not present

## 2020-02-07 DIAGNOSIS — Z720 Tobacco use: Secondary | ICD-10-CM | POA: Diagnosis not present

## 2020-02-07 DIAGNOSIS — I2510 Atherosclerotic heart disease of native coronary artery without angina pectoris: Secondary | ICD-10-CM | POA: Diagnosis not present

## 2020-02-07 DIAGNOSIS — R69 Illness, unspecified: Secondary | ICD-10-CM | POA: Diagnosis not present

## 2020-02-07 DIAGNOSIS — E782 Mixed hyperlipidemia: Secondary | ICD-10-CM | POA: Diagnosis not present

## 2020-02-07 DIAGNOSIS — E785 Hyperlipidemia, unspecified: Secondary | ICD-10-CM | POA: Diagnosis not present

## 2020-02-07 DIAGNOSIS — K279 Peptic ulcer, site unspecified, unspecified as acute or chronic, without hemorrhage or perforation: Secondary | ICD-10-CM | POA: Diagnosis not present

## 2020-02-07 DIAGNOSIS — I10 Essential (primary) hypertension: Secondary | ICD-10-CM | POA: Diagnosis not present

## 2020-02-07 DIAGNOSIS — M159 Polyosteoarthritis, unspecified: Secondary | ICD-10-CM | POA: Diagnosis not present

## 2020-03-07 ENCOUNTER — Telehealth: Payer: Self-pay

## 2020-03-07 NOTE — Telephone Encounter (Unsigned)
Copied from [place] #[id number]. Topic: General - Other >> Mar 07, 2020  9:52 AM Leward Quan A wrote: Reason for CRM: Patient called in and scheduled an appointment for 11.00 AM 03/08/20 nothing available today, per patient he pulled something in his arm / shoulder while turning on his leaf blower please advise Ph#  [phone]

## 2020-03-08 ENCOUNTER — Ambulatory Visit (INDEPENDENT_AMBULATORY_CARE_PROVIDER_SITE_OTHER): Payer: Medicare HMO | Admitting: Family Medicine

## 2020-03-08 ENCOUNTER — Encounter: Payer: Self-pay | Admitting: Family Medicine

## 2020-03-08 ENCOUNTER — Other Ambulatory Visit: Payer: Self-pay

## 2020-03-08 VITALS — BP 130/78 | HR 64 | Ht 71.0 in | Wt 162.0 lb

## 2020-03-08 DIAGNOSIS — S46311A Strain of muscle, fascia and tendon of triceps, right arm, initial encounter: Secondary | ICD-10-CM | POA: Diagnosis not present

## 2020-03-08 MED ORDER — TRAMADOL HCL 50 MG PO TABS: 50.0000 mg | ORAL_TABLET | Freq: Two times a day (BID) | ORAL | 0 refills | Status: AC

## 2020-03-08 MED ORDER — MELOXICAM 15 MG PO TABS: 15.0000 mg | ORAL_TABLET | Freq: Every day | ORAL | 0 refills | Status: DC

## 2020-03-08 NOTE — Progress Notes (Signed)
ram   Date:  03/08/2020   Name:  Stuart Jenkins   DOB:  1940-06-03   MRN:  [id number]   Chief Complaint: Shoulder Pain (R) shoulder pain started x 2 days ago after pulling on a leaf blower chain)  Shoulder Pain  The pain is present in the right shoulder. This is a new problem. The current episode started in the past 7 days (Tuesday morning). There has been a history of trauma ("felt like it tore"). The problem occurs constantly. The problem has been gradually worsening. The quality of the pain is described as aching. The pain is at a severity of 8/10. The pain is moderate. Associated symptoms include a limited range of motion and stiffness. Pertinent negatives include no fever, inability to bear weight, joint locking, joint swelling, numbness or tingling. The symptoms are aggravated by activity. He has tried acetaminophen for the symptoms. The treatment provided no relief.    Lab Results  Component Value Date   CREATININE 1.03 12/20/2019   BUN 13 12/20/2019   NA 139 12/20/2019   K 4.5 12/20/2019   CL 101 12/20/2019   CO2 24 12/20/2019   Lab Results  Component Value Date   CHOL 88 (L) 12/20/2019   HDL 29 (L) 12/20/2019   LDLCALC 32 12/20/2019   TRIG 163 (H) 12/20/2019   CHOLHDL 3.1 01/31/2019   No results found for: TSH Lab Results  Component Value Date   HGBA1C 5.5 12/24/2014   Lab Results  Component Value Date   WBC 4.3 12/23/2014   HGB 9.9 (L) 12/24/2014   HCT 31.7 (L) 12/23/2014   MCV 90.5 12/23/2014   PLT 384 12/23/2014   Lab Results  Component Value Date   ALT 42 12/20/2019   AST 37 12/20/2019   ALKPHOS 99 12/20/2019   BILITOT 0.5 12/20/2019     Review of Systems  Constitutional: Negative for chills and fever.  HENT: Negative for drooling, ear discharge, ear pain and sore throat.   Respiratory: Negative for cough, shortness of breath and wheezing.   Cardiovascular: Negative for chest pain, palpitations and leg swelling.  Gastrointestinal: Negative for  abdominal pain, blood in stool, constipation, diarrhea and nausea.  Endocrine: Negative for polydipsia.  Genitourinary: Negative for dysuria, frequency, hematuria and urgency.  Musculoskeletal: Positive for stiffness. Negative for back pain, myalgias and neck pain.  Skin: Negative for rash.  Allergic/Immunologic: Negative for environmental allergies.  Neurological: Negative for dizziness, tingling, numbness and headaches.  Hematological: Does not bruise/bleed easily.  Psychiatric/Behavioral: Negative for suicidal ideas. The patient is not nervous/anxious.     Patient Active Problem List   Diagnosis Date Noted  . Pain in limb 02/12/2018  . Coronary artery disease involving coronary bypass graft of native heart without angina pectoris 07/30/2017  . Mixed hyperlipidemia 09/11/2016  . Benign prostatic hyperplasia with lower urinary tract symptoms 09/11/2016  . Hematospermia 07/10/2015  . Essential hypertension 06/12/2015  . Cervical disc disorder 04/10/2015  . Hypotension 12/24/2014  . Hyponatremia 12/24/2014  . Anemia 12/24/2014  . Hyperglycemia 12/24/2014  . Syncopal episodes 12/23/2014  . Acute postoperative pain 12/07/2014  . Cigarette smoker 12/06/2014  . Primary localized osteoarthrosis, hand 11/17/2013  . Chronic prostatitis 03/09/2012    Allergies  Allergen Reactions  . Oxycodone Hcl Other (See Comments)    Altered mental status  . Sulfa Antibiotics Rash    Past Surgical History:  Procedure Laterality Date  . APPENDECTOMY    . CARDIAC CATHETERIZATION N/A 11/08/2014   Procedure: Left  Heart Cath and Coronary Angiography;  Surgeon: Yolonda Kida, MD;  Location: [facility];  Service: Cardiovascular;  Laterality: N/A;  . CARDIOVERSION    . CATARACT EXTRACTION W/PHACO Right 06/01/2018   Procedure: CATARACT EXTRACTION PHACO AND INTRAOCULAR LENS PLACEMENT ([place]) RIGHT;  Surgeon: Birder Robson, MD;  Location: [facility];  Service: Ophthalmology;  Laterality:  Right;  [place] 00:50.5 CDE 7.01 Fluid Pack Lot # [id number]  . CATARACT EXTRACTION W/PHACO Left 08/10/2018   Procedure: CATARACT EXTRACTION PHACO AND INTRAOCULAR LENS PLACEMENT ([place])  COMPLICATED LEFT MALYUGIN;  Surgeon: Birder Robson, MD;  Location: [facility];  Service: Ophthalmology;  Laterality: Left;  MALYUGIN  . COLONOSCOPY    . COLONOSCOPY WITH PROPOFOL N/A 02/23/2017   Procedure: COLONOSCOPY WITH PROPOFOL;  Surgeon: Lollie Sails, MD;  Location: [facility];  Service: Endoscopy;  Laterality: N/A;  . CORONARY ANGIOPLASTY    . CORONARY ARTERY BYPASS GRAFT    . ELECTROPHYSIOLOGIC STUDY N/A 01/30/2015   Procedure: Cardioversion;  Surgeon: Yolonda Kida, MD;  Location: [facility];  Service: Cardiovascular;  Laterality: N/A;  . FRACTURE SURGERY Left    arm, knee, collar bone  . FRACTURE SURGERY    . GASTRIC RESECTION     partial  . open heart surgery  12/05/2014    Social History   Tobacco Use  . Smoking status: Former Smoker    Quit date: 11/07/1984    Years since quitting: 35.3  . Smokeless tobacco: Never Used  Vaping Use  . Vaping Use: Never used  Substance Use Topics  . Alcohol use: Yes    Comment: less than 1 drink/week  . Drug use: No     Medication list has been reviewed and updated.  Current Meds  Medication Sig  . amLODipine (NORVASC) 5 MG tablet Take 1 tablet (5 mg total) by mouth daily.  Marland Kitchen aspirin EC 81 MG tablet Take 1 tablet (81 mg total) by mouth daily.  Marland Kitchen atorvastatin (LIPITOR) 40 MG tablet Take 1 tablet (40 mg total) by mouth daily.  . fluticasone (FLONASE) 50 MCG/ACT nasal spray Place 2 sprays into both nostrils daily. (Patient taking differently: Place 2 sprays into both nostrils daily. PRN)  . metoprolol succinate (TOPROL-XL) 25 MG 24 hr tablet One a day  . tamsulosin (FLOMAX) 0.4 MG CAPS capsule Take 1 capsule (0.4 mg total) by mouth daily.  . traZODone (DESYREL) 50 MG tablet TAKE 1/2-1 TABLETS BY MOUTH AT BEDTIME AS NEEDED FOR SLEEP.     PHQ 2/9 Scores 03/08/2020 12/20/2019 08/02/2019 07/27/2019  PHQ - 2 Score 0 0 0 0  PHQ- 9 Score 0 2 0 -    GAD 7 : Generalized Anxiety Score 03/08/2020 12/20/2019 08/02/2019 06/20/2019  Nervous, Anxious, on Edge 0 0 0 0  Control/stop worrying 0 2 0 0  Worry too much - different things 0 2 0 0  Trouble relaxing 0 0 0 0  Restless 0 0 0 0  Easily annoyed or irritable 0 0 0 0  Afraid - awful might happen 0 0 0 0  Total GAD 7 Score 0 4 0 0  Anxiety Difficulty - Somewhat difficult - -    BP Readings from Last 3 Encounters:  03/08/20 130/78  12/20/19 130/80  08/02/19 120/70    Physical Exam Vitals and nursing note reviewed.  HENT:     Head: Normocephalic.     Right Ear: External ear normal.     Left Ear: Tympanic membrane and external ear normal.  Nose: Nose normal. No congestion or rhinorrhea.     Mouth/Throat:     Mouth: Oropharynx is clear and moist.  Eyes:     General: No scleral icterus.       Right eye: No discharge.        Left eye: No discharge.     Extraocular Movements: EOM normal.     Conjunctiva/sclera: Conjunctivae normal.     Pupils: Pupils are equal, round, and reactive to light.  Neck:     Thyroid: No thyromegaly.     Vascular: No JVD.     Trachea: No tracheal deviation.  Cardiovascular:     Rate and Rhythm: Normal rate and regular rhythm.     Pulses: Intact distal pulses.     Heart sounds: Normal heart sounds. No murmur heard. No friction rub. No gallop.   Pulmonary:     Effort: No respiratory distress.     Breath sounds: Normal breath sounds. No wheezing, rhonchi or rales.  Abdominal:     General: Bowel sounds are normal.     Palpations: Abdomen is soft. There is no hepatosplenomegaly or mass.     Tenderness: There is no abdominal tenderness. There is no CVA tenderness, guarding or rebound.  Musculoskeletal:        General: No tenderness or edema.     Right shoulder: No swelling, deformity, effusion, tenderness, bony tenderness or crepitus.  Decreased range of motion. Decreased strength.     Cervical back: Normal range of motion and neck supple.     Comments: Inability to extend right arm  Lymphadenopathy:     Cervical: No cervical adenopathy.  Skin:    General: Skin is warm.     Findings: No rash.  Neurological:     Mental Status: He is alert and oriented to person, place, and time.     Cranial Nerves: No cranial nerve deficit.     Deep Tendon Reflexes: Strength normal and reflexes are normal and symmetric.     Wt Readings from Last 3 Encounters:  03/08/20 162 lb (73.5 kg)  12/20/19 157 lb (71.2 kg)  08/02/19 167 lb (75.8 kg)    BP 130/78   Pulse 64   Ht 5\' 11"  (1.803 m)   Wt 162 lb (73.5 kg)   BMI 22.59 kg/m   Assessment and Plan:  1. Triceps strain, right, initial encounter New onset.  Persistent.  Pain uncontrolled.  Significant decrease in range of motion secondary to pain.  Patient was trying to start a week for and felt a tear in the back of his right shoulder.  There is no bruising or palpable tenderness but I suspect there is either a avulsion or tear of the triceps tendon.  Will place on meloxicam 15 mg once a day.  Patient's been given instructions to ice the area and to use it limitedly.  Patient is also been given some tramadol to take at night primarily.  Referral has been made with orthopedics for evaluation by Reche Dixon who is seen in the past. - meloxicam (MOBIC) 15 MG tablet; Take 1 tablet (15 mg total) by mouth daily.  Dispense: 30 tablet; Refill: 0 - Ambulatory referral to Orthopedic Surgery

## 2020-03-08 NOTE — Patient Instructions (Signed)
Triceps Tendinitis  Triceps tendinitis is inflammation of the triceps tendon, which is located behind the elbow. The triceps tendon is a strong cord of tissue that connects the triceps muscle, on the back of the upper arm, to a bone in the elbow (ulna). The triceps muscle helps to bend and straighten the elbow. Triceps tendinitis can make it difficult to do both of these movements. Triceps tendinitis may include a grade 1 or grade 2 strain of the tendon.  A grade 1 strain is mild, and involves a slight pull of the tendon without any stretching or noticeable tearing of the tendon. There is usually no loss of triceps muscle strength.  A grade 2 strain is moderate, and involves a small tear in the tendon. The tendon is stretched and triceps strength is usually decreased. What are the causes? This condition may be caused by:  Overuse of the triceps muscle.  A direct, forceful hit or injury to the triceps tendon. What increases the risk? You are more likely to develop this condition if:  You participate in sports or activities where you need to suddenly tighten the triceps muscle, such as biking or motorcycle riding.  You play sports where you must move your arm against resistance, such as weight lifting or bodybuilding.  You have poor strength and flexibility of the arm and shoulder.  You use steroids. What are the signs or symptoms? Symptoms of this condition include:  Pain and inflammation in the back of the elbow and upper arm.  Bruising in the back of the elbow and upper arm.  Decreased strength when straightening the elbow or gripping objects.  A crackling sound (crepitation) when moving or touching the elbow or upper arm. In some cases, symptoms may return after treatment, and they may be long-lasting. How is this diagnosed? This condition is diagnosed based on your symptoms, your medical history, and a physical exam. You may have tests, including X-rays or an MRI. How is this  treated? This condition is treated by resting and icing the injured area, and by doing exercises that help with arm movement and strength (physical therapy). Treatment may also include:  Taking over-the-counter medicines that help to relieve pain and inflammation.  Keeping the elbow in place for a period of time (immobilization). This may be done by wearing a brace or a sling on your elbow.  Having an injection of an anti-inflammatory medicine (steroid) mixed with a numbing medicine (local anesthetic).  Having surgery to repair the triceps tendon. This is rare. Follow these instructions at home: If you have a brace or sling:  Wear it as told by your health care provider. Remove it only as told by your health care provider.  Loosen it if your fingers tingle, become numb, or turn cold and blue.  Keep it clean and dry. Bathing  Do not take baths, swim, or use a hot tub until your health care provider approves.  If the brace or sling is not waterproof: ? Do not let it get wet. ? Cover it with a waterproof covering when you take a bath or shower. Managing pain, stiffness, and swelling      If directed, put ice on the injured area. ? If you have a removable brace or sling, remove it as told by your health care provider. ? Put ice in a plastic bag. ? Place a towel between your skin and the bag. ? Leave the ice on for 20 minutes, 2-3 times a day.  If directed,  apply heat to the affected area before you exercise. Use the heat source that your health care provider recommends, such as a moist heat pack or a heating pad. ? If you have a removable brace or sling, remove it as told by your health care provider. ? Place a towel between your skin and the heat source. ? Leave the heat on for 20-30 minutes. ? Remove the heat if your skin turns bright red. This is especially important if you are unable to feel pain, heat, or cold. You may have a greater risk of getting burned.  Move your  fingers often to reduce stiffness and swelling.  Raise (elevate) the injured area above the level of your heart while you are sitting or lying down. Activity  Do not lift anything that is heavier than 10 lb (4.5 kg) until your health care provider says that it is safe.  Ask your health care provider when it is safe to drive if you have a brace or sling on your arm.  Avoid activities that cause pain or make your condition worse.  Do exercises as told by your health care provider.  Return to your normal activities as told by your health care provider. Ask your health care provider what activities are safe for you. General instructions  Take over-the-counter and prescription medicines only as told by your health care provider.  Keep all follow-up visits as told by your health care provider. This is important. How is this prevented?  Warm up and stretch before being active.  Cool down and stretch after being active.  Give your body time to rest between periods of activity.  Maintain physical fitness, including strength and flexibility.  Wear a brace or tape your arm when playing contact sports, as told by your health care provider.  Be safe and responsible while being active. This will help you to avoid falls. Contact a health care provider if:  You have symptoms that get worse or do not get better after 2 weeks of treatment.  You have new symptoms. Get help right away if:  You have severe pain.  You have numbness or tingling in your hand.  Your hand feels unusually cold.  Your fingernails turn a dark color, such as blue or gray. Summary  Triceps tendinitis is inflammation of the triceps tendon, which is located behind the elbow.  This condition may be caused by overuse or injury to the area.  Symptoms may include pain, bruising, decreased strength when straightening the elbow or gripping objects, and a crackling noise when moving or touching the elbow or upper  arm.  This condition may be treated with rest, ice, medicines, exercises to help with movement and strength (physical therapy), and keeping the elbow in place for a period of time (immobilization). This information is not intended to replace advice given to you by your health care provider. Make sure you discuss any questions you have with your health care provider. Document Revised: 07/06/2018 Document Reviewed: 06/09/2018 Elsevier Patient Education  [place].

## 2020-03-14 DIAGNOSIS — H43813 Vitreous degeneration, bilateral: Secondary | ICD-10-CM | POA: Diagnosis not present

## 2020-03-31 ENCOUNTER — Other Ambulatory Visit: Payer: Self-pay | Admitting: Family Medicine

## 2020-03-31 DIAGNOSIS — S46311A Strain of muscle, fascia and tendon of triceps, right arm, initial encounter: Secondary | ICD-10-CM

## 2020-04-03 DIAGNOSIS — M25511 Pain in right shoulder: Secondary | ICD-10-CM | POA: Diagnosis not present

## 2020-04-03 DIAGNOSIS — M7541 Impingement syndrome of right shoulder: Secondary | ICD-10-CM | POA: Diagnosis not present

## 2020-04-26 DIAGNOSIS — Z872 Personal history of diseases of the skin and subcutaneous tissue: Secondary | ICD-10-CM | POA: Diagnosis not present

## 2020-04-26 DIAGNOSIS — L578 Other skin changes due to chronic exposure to nonionizing radiation: Secondary | ICD-10-CM | POA: Diagnosis not present

## 2020-04-26 DIAGNOSIS — Z859 Personal history of malignant neoplasm, unspecified: Secondary | ICD-10-CM | POA: Diagnosis not present

## 2020-04-26 DIAGNOSIS — L570 Actinic keratosis: Secondary | ICD-10-CM | POA: Diagnosis not present

## 2020-06-07 ENCOUNTER — Other Ambulatory Visit: Payer: Self-pay

## 2020-06-07 ENCOUNTER — Encounter: Payer: Self-pay | Admitting: Family Medicine

## 2020-06-07 ENCOUNTER — Ambulatory Visit (INDEPENDENT_AMBULATORY_CARE_PROVIDER_SITE_OTHER): Payer: Medicare HMO | Admitting: Family Medicine

## 2020-06-07 VITALS — BP 120/80 | HR 72 | Ht 71.0 in | Wt 162.0 lb

## 2020-06-07 DIAGNOSIS — I10 Essential (primary) hypertension: Secondary | ICD-10-CM

## 2020-06-07 DIAGNOSIS — E782 Mixed hyperlipidemia: Secondary | ICD-10-CM | POA: Diagnosis not present

## 2020-06-07 DIAGNOSIS — N401 Enlarged prostate with lower urinary tract symptoms: Secondary | ICD-10-CM | POA: Diagnosis not present

## 2020-06-07 MED ORDER — TAMSULOSIN HCL 0.4 MG PO CAPS: 0.4000 mg | ORAL_CAPSULE | Freq: Every day | ORAL | 1 refills | Status: DC

## 2020-06-07 MED ORDER — AMLODIPINE BESYLATE 5 MG PO TABS: 5.0000 mg | ORAL_TABLET | Freq: Every day | ORAL | 1 refills | Status: DC

## 2020-06-07 MED ORDER — METOPROLOL SUCCINATE ER 25 MG PO TB24: ORAL_TABLET | ORAL | 1 refills | Status: DC

## 2020-06-07 MED ORDER — ATORVASTATIN CALCIUM 40 MG PO TABS: 40.0000 mg | ORAL_TABLET | Freq: Every day | ORAL | 1 refills | Status: DC

## 2020-06-07 NOTE — Progress Notes (Signed)
Date:  06/07/2020   Name:  Stuart Jenkins   DOB:  12-02-1940   MRN:  [id number]   Chief Complaint: Hypertension, Hyperlipidemia, and Benign Prostatic Hypertrophy  Hypertension This is a chronic problem. The current episode started more than 1 year ago. The problem has been gradually improving since onset. The problem is controlled. Pertinent negatives include no anxiety, blurred vision, chest pain, headaches, malaise/fatigue, neck pain, orthopnea, palpitations, peripheral edema, PND, shortness of breath or sweats. There are no associated agents to hypertension. Risk factors for coronary artery disease include dyslipidemia. Past treatments include calcium channel blockers and beta blockers. The current treatment provides moderate improvement. There are no compliance problems.  There is no history of angina, kidney disease, CAD/MI, CVA, heart failure, left ventricular hypertrophy, PVD or retinopathy. There is no history of chronic renal disease, a hypertension causing med or renovascular disease.  Hyperlipidemia This is a chronic problem. The current episode started more than 1 year ago. The problem is controlled. Recent lipid tests were reviewed and are normal. He has no history of chronic renal disease, diabetes, hypothyroidism, liver disease, obesity or nephrotic syndrome. Pertinent negatives include no chest pain, focal sensory loss, focal weakness, leg pain, myalgias or shortness of breath. Current antihyperlipidemic treatment includes statins. The current treatment provides moderate improvement of lipids. There are no compliance problems.  Risk factors for coronary artery disease include dyslipidemia and hypertension.  Benign Prostatic Hypertrophy This is a chronic problem. The current episode started more than 1 year ago. The problem has been gradually improving since onset. Irritative symptoms do not include frequency, nocturia or urgency. Obstructive symptoms do not include dribbling,  incomplete emptying, an intermittent stream, a slower stream, straining or a weak stream. Pertinent negatives include no chills, dysuria, hematuria, hesitancy, nausea or vomiting.    Lab Results  Component Value Date   CREATININE 1.03 12/20/2019   BUN 13 12/20/2019   NA 139 12/20/2019   K 4.5 12/20/2019   CL 101 12/20/2019   CO2 24 12/20/2019   Lab Results  Component Value Date   CHOL 88 (L) 12/20/2019   HDL 29 (L) 12/20/2019   LDLCALC 32 12/20/2019   TRIG 163 (H) 12/20/2019   CHOLHDL 3.1 01/31/2019   No results found for: TSH Lab Results  Component Value Date   HGBA1C 5.5 12/24/2014   Lab Results  Component Value Date   WBC 4.3 12/23/2014   HGB 9.9 (L) 12/24/2014   HCT 31.7 (L) 12/23/2014   MCV 90.5 12/23/2014   PLT 384 12/23/2014   Lab Results  Component Value Date   ALT 42 12/20/2019   AST 37 12/20/2019   ALKPHOS 99 12/20/2019   BILITOT 0.5 12/20/2019     Review of Systems  Constitutional: Negative for chills, fever and malaise/fatigue.  HENT: Negative for drooling, ear discharge, ear pain and sore throat.   Eyes: Negative for blurred vision.  Respiratory: Negative for cough, shortness of breath and wheezing.   Cardiovascular: Negative for chest pain, palpitations, orthopnea, leg swelling and PND.  Gastrointestinal: Negative for abdominal pain, blood in stool, constipation, diarrhea, nausea and vomiting.  Endocrine: Negative for polydipsia.  Genitourinary: Negative for dysuria, frequency, hematuria, hesitancy, incomplete emptying, nocturia and urgency.  Musculoskeletal: Negative for back pain, myalgias and neck pain.  Skin: Negative for rash.  Allergic/Immunologic: Negative for environmental allergies.  Neurological: Negative for dizziness, focal weakness and headaches.  Hematological: Does not bruise/bleed easily.  Psychiatric/Behavioral: Negative for suicidal ideas. The patient is not  nervous/anxious.     Patient Active Problem List   Diagnosis Date  Noted  . Pain in limb 02/12/2018  . Coronary artery disease involving coronary bypass graft of native heart without angina pectoris 07/30/2017  . Mixed hyperlipidemia 09/11/2016  . Benign prostatic hyperplasia with lower urinary tract symptoms 09/11/2016  . Hematospermia 07/10/2015  . Essential hypertension 06/12/2015  . Cervical disc disorder 04/10/2015  . Hypotension 12/24/2014  . Hyponatremia 12/24/2014  . Anemia 12/24/2014  . Hyperglycemia 12/24/2014  . Syncopal episodes 12/23/2014  . Acute postoperative pain 12/07/2014  . Cigarette smoker 12/06/2014  . Primary localized osteoarthrosis, hand 11/17/2013  . Chronic prostatitis 03/09/2012    Allergies  Allergen Reactions  . Oxycodone Hcl Other (See Comments)    Altered mental status  . Sulfa Antibiotics Rash    Past Surgical History:  Procedure Laterality Date  . APPENDECTOMY    . CARDIAC CATHETERIZATION N/A 11/08/2014   Procedure: Left Heart Cath and Coronary Angiography;  Surgeon: Yolonda Kida, MD;  Location: [facility];  Service: Cardiovascular;  Laterality: N/A;  . CARDIOVERSION    . CATARACT EXTRACTION W/PHACO Right 06/01/2018   Procedure: CATARACT EXTRACTION PHACO AND INTRAOCULAR LENS PLACEMENT ([place]) RIGHT;  Surgeon: Birder Robson, MD;  Location: [facility];  Service: Ophthalmology;  Laterality: Right;  [place] 00:50.5 CDE 7.01 Fluid Pack Lot # [id number]  . CATARACT EXTRACTION W/PHACO Left 08/10/2018   Procedure: CATARACT EXTRACTION PHACO AND INTRAOCULAR LENS PLACEMENT ([place])  COMPLICATED LEFT MALYUGIN;  Surgeon: Birder Robson, MD;  Location: [facility];  Service: Ophthalmology;  Laterality: Left;  MALYUGIN  . COLONOSCOPY    . COLONOSCOPY WITH PROPOFOL N/A 02/23/2017   Procedure: COLONOSCOPY WITH PROPOFOL;  Surgeon: Lollie Sails, MD;  Location: [facility];  Service: Endoscopy;  Laterality: N/A;  . CORONARY ANGIOPLASTY    . CORONARY ARTERY BYPASS GRAFT    . ELECTROPHYSIOLOGIC STUDY N/A  01/30/2015   Procedure: Cardioversion;  Surgeon: Yolonda Kida, MD;  Location: [facility];  Service: Cardiovascular;  Laterality: N/A;  . FRACTURE SURGERY Left    arm, knee, collar bone  . FRACTURE SURGERY    . GASTRIC RESECTION     partial  . open heart surgery  12/05/2014    Social History   Tobacco Use  . Smoking status: Former Smoker    Quit date: 11/07/1984    Years since quitting: 35.6  . Smokeless tobacco: Never Used  Vaping Use  . Vaping Use: Never used  Substance Use Topics  . Alcohol use: Yes    Comment: less than 1 drink/week  . Drug use: No     Medication list has been reviewed and updated.  Current Meds  Medication Sig  . amLODipine (NORVASC) 5 MG tablet Take 1 tablet (5 mg total) by mouth daily.  Marland Kitchen aspirin EC 81 MG tablet Take 1 tablet (81 mg total) by mouth daily.  Marland Kitchen atorvastatin (LIPITOR) 40 MG tablet Take 1 tablet (40 mg total) by mouth daily.  . fluticasone (FLONASE) 50 MCG/ACT nasal spray Place 2 sprays into both nostrils daily. (Patient taking differently: Place 2 sprays into both nostrils daily. PRN)  . meloxicam (MOBIC) 15 MG tablet TAKE 1 TABLET (15 MG TOTAL) BY MOUTH DAILY.  . metoprolol succinate (TOPROL-XL) 25 MG 24 hr tablet One a day  . tamsulosin (FLOMAX) 0.4 MG CAPS capsule Take 1 capsule (0.4 mg total) by mouth daily.  . traZODone (DESYREL) 50 MG tablet TAKE 1/2-1 TABLETS BY MOUTH AT BEDTIME AS  NEEDED FOR SLEEP.    PHQ 2/9 Scores 06/07/2020 03/08/2020 12/20/2019 08/02/2019  PHQ - 2 Score 0 0 0 0  PHQ- 9 Score 0 0 2 0    GAD 7 : Generalized Anxiety Score 06/07/2020 03/08/2020 12/20/2019 08/02/2019  Nervous, Anxious, on Edge 0 0 0 0  Control/stop worrying 0 0 2 0  Worry too much - different things 0 0 2 0  Trouble relaxing 0 0 0 0  Restless 0 0 0 0  Easily annoyed or irritable 0 0 0 0  Afraid - awful might happen 0 0 0 0  Total GAD 7 Score 0 0 4 0  Anxiety Difficulty - - Somewhat difficult -    BP Readings from Last 3 Encounters:   06/07/20 120/80  03/08/20 130/78  12/20/19 130/80    Physical Exam Vitals and nursing note reviewed.  HENT:     Head: Normocephalic.     Right Ear: Tympanic membrane and external ear normal.     Left Ear: Tympanic membrane and external ear normal.     Nose: Nose normal.  Eyes:     General: No scleral icterus.       Right eye: No discharge.        Left eye: No discharge.     Conjunctiva/sclera: Conjunctivae normal.     Pupils: Pupils are equal, round, and reactive to light.  Neck:     Thyroid: No thyromegaly.     Vascular: No JVD.     Trachea: No tracheal deviation.  Cardiovascular:     Rate and Rhythm: Normal rate and regular rhythm.     Heart sounds: Normal heart sounds, S1 normal and S2 normal. No murmur heard.  No systolic murmur is present.  No diastolic murmur is present. No friction rub. No gallop. No S3 or S4 sounds.   Pulmonary:     Effort: No respiratory distress.     Breath sounds: Normal breath sounds. No wheezing or rales.  Abdominal:     General: Bowel sounds are normal.     Palpations: Abdomen is soft. There is no mass.     Tenderness: There is no abdominal tenderness. There is no guarding or rebound.  Musculoskeletal:        General: No tenderness. Normal range of motion.     Cervical back: Normal range of motion and neck supple.  Lymphadenopathy:     Cervical: No cervical adenopathy.  Skin:    General: Skin is warm.     Findings: No rash.  Neurological:     Mental Status: He is alert and oriented to person, place, and time.     Cranial Nerves: No cranial nerve deficit.     Deep Tendon Reflexes: Reflexes are normal and symmetric.     Wt Readings from Last 3 Encounters:  06/07/20 162 lb (73.5 kg)  03/08/20 162 lb (73.5 kg)  12/20/19 157 lb (71.2 kg)    BP 120/80   Pulse 72   Ht 5\' 11"  (1.803 m)   Wt 162 lb (73.5 kg)   BMI 22.59 kg/m   Assessment and Plan:  1. Essential hypertension Chronic.  Controlled.  Stable.  Blood pressure is  120/80.  Refill amlodipine 5 mg once a day and metoprolol XL 25 mg once a day.  Will recheck in 6 months.  Today we will check an renal function panel. - amLODipine (NORVASC) 5 MG tablet; Take 1 tablet (5 mg total) by mouth daily.  Dispense: 90 tablet; Refill:  1 - metoprolol succinate (TOPROL-XL) 25 MG 24 hr tablet; One a day  Dispense: 90 tablet; Refill: 1 - Renal Function Panel  2. Mixed hyperlipidemia Chronic.  Controlled.  Stable.  Continue atorvastatin 40 mg once a day.  Review of previous lipid panel was acceptable.  We will recheck patient in 6 months. - atorvastatin (LIPITOR) 40 MG tablet; Take 1 tablet (40 mg total) by mouth daily.  Dispense: 90 tablet; Refill: 1  3. Benign prostatic hyperplasia with lower urinary tract symptoms, symptom details unspecified Chronic.  Controlled.  Stable.  Symptoms are minimal on tamsulosin 0.4 mg once a day.  Will recheck with DRE in 6 months. - tamsulosin (FLOMAX) 0.4 MG CAPS capsule; Take 1 capsule (0.4 mg total) by mouth daily.  Dispense: 90 capsule; Refill: 1

## 2020-06-08 LAB — RENAL FUNCTION PANEL
Albumin: 4.4 g/dL (ref 3.7–4.7)
BUN/Creatinine Ratio: 14 (ref 10–24)
BUN: 14 mg/dL (ref 8–27)
CO2: 23 mmol/L (ref 20–29)
Calcium: 9.2 mg/dL (ref 8.6–10.2)
Chloride: 101 mmol/L (ref 96–106)
Creatinine, Ser: 1.03 mg/dL (ref 0.76–1.27)
Glucose: 92 mg/dL (ref 65–99)
Phosphorus: 3.2 mg/dL (ref 2.8–4.1)
Potassium: 4.8 mmol/L (ref 3.5–5.2)
Sodium: 139 mmol/L (ref 134–144)
eGFR: 74 mL/min/{1.73_m2} (ref >59)

## 2020-06-23 DIAGNOSIS — I10 Essential (primary) hypertension: Secondary | ICD-10-CM | POA: Diagnosis not present

## 2020-06-23 DIAGNOSIS — E785 Hyperlipidemia, unspecified: Secondary | ICD-10-CM | POA: Diagnosis not present

## 2020-06-23 DIAGNOSIS — N400 Enlarged prostate without lower urinary tract symptoms: Secondary | ICD-10-CM | POA: Diagnosis not present

## 2020-06-23 DIAGNOSIS — Z008 Encounter for other general examination: Secondary | ICD-10-CM | POA: Diagnosis not present

## 2020-06-23 DIAGNOSIS — Z8249 Family history of ischemic heart disease and other diseases of the circulatory system: Secondary | ICD-10-CM | POA: Diagnosis not present

## 2020-06-23 DIAGNOSIS — Z7982 Long term (current) use of aspirin: Secondary | ICD-10-CM | POA: Diagnosis not present

## 2020-06-23 DIAGNOSIS — I2510 Atherosclerotic heart disease of native coronary artery without angina pectoris: Secondary | ICD-10-CM | POA: Diagnosis not present

## 2020-06-23 DIAGNOSIS — Z7722 Contact with and (suspected) exposure to environmental tobacco smoke (acute) (chronic): Secondary | ICD-10-CM | POA: Diagnosis not present

## 2020-06-23 DIAGNOSIS — M1990 Unspecified osteoarthritis, unspecified site: Secondary | ICD-10-CM | POA: Diagnosis not present

## 2020-06-23 DIAGNOSIS — Z803 Family history of malignant neoplasm of breast: Secondary | ICD-10-CM | POA: Diagnosis not present

## 2020-06-23 DIAGNOSIS — Z85828 Personal history of other malignant neoplasm of skin: Secondary | ICD-10-CM | POA: Diagnosis not present

## 2020-07-02 DIAGNOSIS — I2510 Atherosclerotic heart disease of native coronary artery without angina pectoris: Secondary | ICD-10-CM | POA: Diagnosis not present

## 2020-07-02 DIAGNOSIS — Z87891 Personal history of nicotine dependence: Secondary | ICD-10-CM | POA: Diagnosis not present

## 2020-07-02 DIAGNOSIS — Z720 Tobacco use: Secondary | ICD-10-CM | POA: Diagnosis not present

## 2020-07-02 DIAGNOSIS — E782 Mixed hyperlipidemia: Secondary | ICD-10-CM | POA: Diagnosis not present

## 2020-07-02 DIAGNOSIS — E785 Hyperlipidemia, unspecified: Secondary | ICD-10-CM | POA: Diagnosis not present

## 2020-07-02 DIAGNOSIS — I10 Essential (primary) hypertension: Secondary | ICD-10-CM | POA: Diagnosis not present

## 2020-07-02 DIAGNOSIS — K279 Peptic ulcer, site unspecified, unspecified as acute or chronic, without hemorrhage or perforation: Secondary | ICD-10-CM | POA: Diagnosis not present

## 2020-07-02 DIAGNOSIS — Z951 Presence of aortocoronary bypass graft: Secondary | ICD-10-CM | POA: Diagnosis not present

## 2020-07-30 ENCOUNTER — Ambulatory Visit (INDEPENDENT_AMBULATORY_CARE_PROVIDER_SITE_OTHER): Payer: Medicare HMO

## 2020-07-30 ENCOUNTER — Other Ambulatory Visit: Payer: Self-pay

## 2020-07-30 VITALS — BP 130/80 | HR 60 | Temp 97.7°F | Resp 16 | Ht 71.0 in | Wt 158.8 lb

## 2020-07-30 DIAGNOSIS — Z0000 Encounter for general adult medical examination without abnormal findings: Secondary | ICD-10-CM | POA: Diagnosis not present

## 2020-07-30 NOTE — Progress Notes (Signed)
Subjective:   Stuart Jenkins is a 80 y.o. male who presents for Medicare Annual/Subsequent preventive examination.  Review of Systems     Cardiac Risk Factors include: advanced age (>37men, >63 women);dyslipidemia;hypertension;male gender     Objective:    Today's Vitals   07/30/20 1123  BP: 130/80  Pulse: 60  Resp: 16  Temp: 97.7 F (36.5 C)  TempSrc: Oral  SpO2: 99%  Weight: 158 lb 12.8 oz (72 kg)  Height: 5\' 11"  (1.803 m)   Body mass index is 22.15 kg/m.  Advanced Directives 07/30/2020 07/27/2019 08/10/2018 06/01/2018 02/23/2017 01/30/2015 12/23/2014  Does Patient Have a Medical Advance Directive? No No No No No No Yes;No  Does patient want to make changes to medical advance directive? - - - - - - No - Patient declined  Would patient like information on creating a medical advance directive? Yes (MAU/Ambulatory/Procedural Areas - Information given) Yes (MAU/Ambulatory/Procedural Areas - Information given) No - Patient declined Yes (ED - Information included in AVS) - No - patient declined information No - patient declined information    Current Medications (verified) Outpatient Encounter Medications as of 07/30/2020  Medication Sig  . amLODipine (NORVASC) 5 MG tablet Take 1 tablet (5 mg total) by mouth daily.  Marland Kitchen aspirin EC 81 MG tablet Take 1 tablet (81 mg total) by mouth daily.  Marland Kitchen atorvastatin (LIPITOR) 40 MG tablet Take 1 tablet (40 mg total) by mouth daily.  . fluticasone (FLONASE) 50 MCG/ACT nasal spray Place 2 sprays into both nostrils daily. (Patient taking differently: Place 2 sprays into both nostrils daily. PRN)  . metoprolol succinate (TOPROL-XL) 25 MG 24 hr tablet One a day  . tamsulosin (FLOMAX) 0.4 MG CAPS capsule Take 1 capsule (0.4 mg total) by mouth daily.  . traZODone (DESYREL) 50 MG tablet TAKE 1/2-1 TABLETS BY MOUTH AT BEDTIME AS NEEDED FOR SLEEP.  . [DISCONTINUED] meloxicam (MOBIC) 15 MG tablet TAKE 1 TABLET (15 MG TOTAL) BY MOUTH DAILY.   No  facility-administered encounter medications on file as of 07/30/2020.    Allergies (verified) Oxycodone hcl and Sulfa antibiotics   History: Past Medical History:  Diagnosis Date  . Atrial fibrillation ([place])   . BPH (benign prostatic hypertrophy)   . Colon polyp   . Coronary artery disease   . Dysrhythmia    AFIB POST CABG / CARDIOVERSION  . Gastric bleed    SURGERY  . Gout   . Hyperlipidemia   . Hypertension   . Peptic ulcer disease    Past Surgical History:  Procedure Laterality Date  . APPENDECTOMY    . CARDIAC CATHETERIZATION N/A 11/08/2014   Procedure: Left Heart Cath and Coronary Angiography;  Surgeon: Yolonda Kida, MD;  Location: [place] [facility];  Service: Cardiovascular;  Laterality: N/A;  . CARDIOVERSION    . CATARACT EXTRACTION W/PHACO Right 06/01/2018   Procedure: CATARACT EXTRACTION PHACO AND INTRAOCULAR LENS PLACEMENT ([place]) RIGHT;  Surgeon: Birder Robson, MD;  Location: [facility];  Service: Ophthalmology;  Laterality: Right;  [place] 00:50.5 CDE 7.01 Fluid Pack Lot # [id number]  . CATARACT EXTRACTION W/PHACO Left 08/10/2018   Procedure: CATARACT EXTRACTION PHACO AND INTRAOCULAR LENS PLACEMENT ([place])  COMPLICATED LEFT MALYUGIN;  Surgeon: Birder Robson, MD;  Location: [place];  Service: Ophthalmology;  Laterality: Left;  MALYUGIN  . COLONOSCOPY    . COLONOSCOPY WITH PROPOFOL N/A 02/23/2017   Procedure: COLONOSCOPY WITH PROPOFOL;  Surgeon: Lollie Sails, MD;  Location[facility] ENDOSCOPY;  Service: Endoscopy;  Laterality:  N/A;  . CORONARY ANGIOPLASTY    . CORONARY ARTERY BYPASS GRAFT    . ELECTROPHYSIOLOGIC STUDY N/A 01/30/2015   Procedure: Cardioversion;  Surgeon: Yolonda Kida, MD;  Location: [facility] ORS;  Service: Cardiovascular;  Laterality: N/A;  . FRACTURE SURGERY Left    arm, knee, collar bone  . FRACTURE SURGERY    . GASTRIC RESECTION     partial  . open heart surgery  12/05/2014   Family History  Problem Relation Age of Onset  .  Hypertension Brother    Social History   Socioeconomic History  . Marital status: Widowed    Spouse name: Not on file  . Number of children: 2  . Years of education: Not on file  . Highest education level: Not on file  Occupational History  . Not on file  Tobacco Use  . Smoking status: Former Smoker    Quit date: 11/07/1984    Years since quitting: 35.7  . Smokeless tobacco: Never Used  Vaping Use  . Vaping Use: Never used  Substance and Sexual Activity  . Alcohol use: Yes    Comment: less than 1 drink/week  . Drug use: No  . Sexual activity: Yes  Other Topics Concern  . Not on file  Social History Narrative   Pt lives alone.    Social Determinants of Health   Financial Resource Strain: Low Risk   . Difficulty of Paying Living Expenses: Not hard at all  Food Insecurity: No Food Insecurity  . Worried About Charity fundraiser in the Last Year: Never true  . Ran Out of Food in the Last Year: Never true  Transportation Needs: No Transportation Needs  . Lack of Transportation (Medical): No  . Lack of Transportation (Non-Medical): No  Physical Activity: Insufficiently Active  . Days of Exercise per Week: 2 days  . Minutes of Exercise per Session: 30 min  Stress: No Stress Concern Present  . Feeling of Stress : Not at all  Social Connections: Socially Isolated  . Frequency of Communication with Friends and Family: More than three times a week  . Frequency of Social Gatherings with Friends and Family: Three times a week  . Attends Religious Services: Never  . Active Member of Clubs or Organizations: No  . Attends Archivist Meetings: Never  . Marital Status: Widowed    Tobacco Counseling Counseling given: Not Answered   Clinical Intake:  Pre-visit preparation completed: Yes  Pain : No/denies pain     BMI - recorded: 22.15 Nutritional Status: BMI of 19-24  Normal Nutritional Risks: None Diabetes: No  How often do you need to have someone help you  when you read instructions, pamphlets, or other written materials from your doctor or pharmacy?: 1 - Never    Interpreter Needed?: No  Information entered by :: Clemetine Marker LPN   Activities of Daily Living In your present state of health, do you have any difficulty performing the following activities: 07/30/2020  Hearing? N  Comment declines hearing aids  Vision? N  Difficulty concentrating or making decisions? N  Walking or climbing stairs? N  Dressing or bathing? N  Doing errands, shopping? N  Preparing Food and eating ? N  Using the Toilet? N  In the past six months, have you accidently leaked urine? N  Do you have problems with loss of bowel control? N  Managing your Medications? N  Managing your Finances? N  Housekeeping or managing your Housekeeping? N  Some recent  data might be hidden    Patient Care Team: Juline Patch, MD as PCP - General (Family Medicine) Yolonda Kida, MD as Consulting Physician (Cardiology) Jannet Mantis, MD (Dermatology) Birder Robson, MD as Referring Physician (Ophthalmology)  Indicate any recent Medical Services you may have received from other than [facility] providers in the past year (date may be approximate).     Assessment:   This is a routine wellness examination for Miranda.  Hearing/Vision screen  Hearing Screening   125Hz  250Hz  500Hz  1000Hz  2000Hz  3000Hz  4000Hz  6000Hz  8000Hz   Right ear:           Left ear:           Comments: Pt denies hearing difficulty  Vision Screening Comments: Annual vision screenings done at [facility]  Dietary issues and exercise activities discussed: Current Exercise Habits: Home exercise routine, Type of exercise: walking, Time (Minutes): 30, Frequency (Times/Week): 2, Weekly Exercise (Minutes/Week): 60, Intensity: Mild, Exercise limited by: None identified  Goals Addressed            This Visit's Progress   . DIET - INCREASE WATER INTAKE   Not on track    Recommend  drinking 6-8 glasses of water per day      Depression Screen PHQ 2/9 Scores 07/30/2020 06/07/2020 03/08/2020 12/20/2019 08/02/2019 07/27/2019 06/20/2019  PHQ - 2 Score 0 0 0 0 0 0 0  PHQ- 9 Score - 0 0 2 0 - 0    Fall Risk Fall Risk  07/30/2020 03/08/2020 12/20/2019 07/27/2019 06/20/2019  Falls in the past year? 0 0 0 0 0  Number falls in past yr: 0 - - 0 -  Injury with Fall? 0 - - 0 -  Risk for fall due to : No Fall Risks - - No Fall Risks -  Follow up Falls prevention discussed Falls evaluation completed Falls evaluation completed Falls prevention discussed Falls evaluation completed    [place]:  Any stairs in or around the home? No  If so, are there any without handrails? No  Home free of loose throw rugs in walkways, pet beds, electrical cords, etc? Yes  Adequate lighting in your home to reduce risk of falls? Yes   ASSISTIVE DEVICES UTILIZED TO PREVENT FALLS:  Life alert? No  Use of a cane, walker or w/c? No  Grab bars in the bathroom? No  Shower chair or bench in shower? Yes  Elevated toilet seat or a handicapped toilet? Yes   TIMED UP AND GO:  Was the test performed? Yes .  Length of time to ambulate 10 feet: 5 sec.   Gait steady and fast without use of assistive device  Cognitive Function:     6CIT Screen 07/30/2020 07/27/2019  What Year? 0 points 0 points  What month? 0 points 0 points  What time? 0 points 0 points  Count back from 20 0 points 0 points  Months in reverse 0 points 4 points  Repeat phrase 2 points 0 points  Total Score 2 4    Immunizations Immunization History  Administered Date(s) Administered  . Fluad Quad(high Dose 65+) 12/20/2019  . Influenza, High Dose Seasonal PF 12/24/2016, 12/25/2017, 11/15/2018  . Influenza,inj,Quad PF,6+ Mos 11/22/2014  . Influenza-Unspecified 11/23/2014, 11/15/2018  . PFIZER(Purple Top)SARS-COV-2 Vaccination 03/31/2019, 04/21/2019, 01/30/2020  . Pneumococcal Conjugate-13 12/31/2017  .  Pneumococcal Polysaccharide-23 01/31/2019    TDAP status: Due, Education has been provided regarding the importance of this vaccine. Advised  may receive this vaccine at local pharmacy or Health Dept. Aware to provide a copy of the vaccination record if obtained from local pharmacy or Health Dept. Verbalized acceptance and understanding.  Flu Vaccine status: Up to date  Pneumococcal vaccine status: Up to date  Covid-19 vaccine status: Completed vaccines  Qualifies for Shingles Vaccine? Yes   Zostavax completed No   Shingrix Completed?: No.    Education has been provided regarding the importance of this vaccine. Patient has been advised to call insurance company to determine out of pocket expense if they have not yet received this vaccine. Advised may also receive vaccine at local pharmacy or Health Dept. Verbalized acceptance and understanding.  Screening Tests Health Maintenance  Topic Date Due  . Hepatitis C Screening  12/19/2020 (Originally 01/08/1959)  . TETANUS/TDAP  03/08/2021 (Originally 01/08/1960)  . INFLUENZA VACCINE  10/22/2020  . COVID-19 Vaccine  Completed  . PNA vac Low Risk Adult  Completed  . HPV VACCINES  Aged Out    Health Maintenance  There are no preventive care reminders to display for this patient.  Colorectal cancer screening: Type of screening: Colonoscopy. Completed 02/23/17/. Repeat every 5 years  Lung Cancer Screening: (Low Dose CT Chest recommended if Age 43-80 years, 30 pack-year currently smoking OR have quit w/in 15years.) does not qualify.   Additional Screening:  Hepatitis C Screening: does qualify; postponed  Vision Screening: Recommended annual ophthalmology exams for early detection of glaucoma and other disorders of the eye. Is the patient up to date with their annual eye exam?  Yes  Who is the provider or what is the name of the office in which the patient attends annual eye exams? [facility] Screening: Recommended annual  dental exams for proper oral hygiene  Community Resource Referral / Chronic Care Management: CRR required this visit?  No   CCM required this visit?  No      Plan:     I have personally reviewed and noted the following in the patient's chart:   . Medical and social history . Use of alcohol, tobacco or illicit drugs  . Current medications and supplements including opioid prescriptions. Patient is not currently taking opioid prescriptions. . Functional ability and status . Nutritional status . Physical activity . Advanced directives . List of other physicians . Hospitalizations, surgeries, and ER visits in previous 12 months . Vitals . Screenings to include cognitive, depression, and falls . Referrals and appointments  In addition, I have reviewed and discussed with patient certain preventive protocols, quality metrics, and best practice recommendations. A written personalized care plan for preventive services as well as general preventive health recommendations were provided to patient.     Clemetine Marker, LPN   [id number]   Nurse Notes: none

## 2020-07-30 NOTE — Patient Instructions (Signed)
Stuart Jenkins , Thank you for taking time to come for your Medicare Wellness Visit. I appreciate your ongoing commitment to your health goals. Please review the following plan we discussed and let me know if I can assist you in the future.   Screening recommendations/referrals: Colonoscopy: done 02/23/17. Repeat in 2023 Recommended yearly ophthalmology/optometry visit for glaucoma screening and checkup Recommended yearly dental visit for hygiene and checkup  Vaccinations: Influenza vaccine: done 12/20/19 Pneumococcal vaccine: done 01/31/19 Tdap vaccine: due Shingles vaccine: Shingrix discussed. Please contact your pharmacy for coverage information.  Covid-19: done 03/31/19, 04/21/19 7 01/30/20  Advanced directives: Advance directive discussed with you today. I have provided a copy for you to complete at home and have notarized. Once this is complete please bring a copy in to our office so we can scan it into your chart.  Conditions/risks identified: Recommend drinking 6-8 glasses of water per day   Next appointment: Follow up in one year for your annual wellness visit.   Preventive Care 55 Years and Older, Male Preventive care refers to lifestyle choices and visits with your health care provider that can promote health and wellness. What does preventive care include?  A yearly physical exam. This is also called an annual well check.  Dental exams once or twice a year.  Routine eye exams. Ask your health care provider how often you should have your eyes checked.  Personal lifestyle choices, including:  Daily care of your teeth and gums.  Regular physical activity.  Eating a healthy diet.  Avoiding tobacco and drug use.  Limiting alcohol use.  Practicing safe sex.  Taking low doses of aspirin every day.  Taking vitamin and mineral supplements as recommended by your health care provider. What happens during an annual well check? The services and screenings done by your health  care provider during your annual well check will depend on your age, overall health, lifestyle risk factors, and family history of disease. Counseling  Your health care provider may ask you questions about your:  Alcohol use.  Tobacco use.  Drug use.  Emotional well-being.  Home and relationship well-being.  Sexual activity.  Eating habits.  History of falls.  Memory and ability to understand (cognition).  Work and work Statistician. Screening  You may have the following tests or measurements:  Height, weight, and BMI.  Blood pressure.  Lipid and cholesterol levels. These may be checked every 5 years, or more frequently if you are over 6 years old.  Skin check.  Lung cancer screening. You may have this screening every year starting at age 11 if you have a 30-pack-year history of smoking and currently smoke or have quit within the past 15 years.  Fecal occult blood test (FOBT) of the stool. You may have this test every year starting at age 22.  Flexible sigmoidoscopy or colonoscopy. You may have a sigmoidoscopy every 5 years or a colonoscopy every 10 years starting at age 57.  Prostate cancer screening. Recommendations will vary depending on your family history and other risks.  Hepatitis C blood test.  Hepatitis B blood test.  Sexually transmitted disease (STD) testing.  Diabetes screening. This is done by checking your blood sugar (glucose) after you have not eaten for a while (fasting). You may have this done every 1-3 years.  Abdominal aortic aneurysm (AAA) screening. You may need this if you are a current or former smoker.  Osteoporosis. You may be screened starting at age 73 if you are at high risk.  Talk with your health care provider about your test results, treatment options, and if necessary, the need for more tests. Vaccines  Your health care provider may recommend certain vaccines, such as:  Influenza vaccine. This is recommended every  year.  Tetanus, diphtheria, and acellular pertussis (Tdap, Td) vaccine. You may need a Td booster every 10 years.  Zoster vaccine. You may need this after age 42.  Pneumococcal 13-valent conjugate (PCV13) vaccine. One dose is recommended after age 87.  Pneumococcal polysaccharide (PPSV23) vaccine. One dose is recommended after age 55. Talk to your health care provider about which screenings and vaccines you need and how often you need them. This information is not intended to replace advice given to you by your health care provider. Make sure you discuss any questions you have with your health care provider. Document Released: 04/06/2015 Document Revised: 11/28/2015 Document Reviewed: 01/09/2015 Elsevier Interactive Patient Education  2017 [place] Prevention in the Home Falls can cause injuries. They can happen to people of all ages. There are many things you can do to make your home safe and to help prevent falls. What can I do on the outside of my home?  Regularly fix the edges of walkways and driveways and fix any cracks.  Remove anything that might make you trip as you walk through a door, such as a raised step or threshold.  Trim any bushes or trees on the path to your home.  Use bright outdoor lighting.  Clear any walking paths of anything that might make someone trip, such as rocks or tools.  Regularly check to see if handrails are loose or broken. Make sure that both sides of any steps have handrails.  Any raised decks and porches should have guardrails on the edges.  Have any leaves, snow, or ice cleared regularly.  Use sand or salt on walking paths during winter.  Clean up any spills in your garage right away. This includes oil or grease spills. What can I do in the bathroom?  Use night lights.  Install grab bars by the toilet and in the tub and shower. Do not use towel bars as grab bars.  Use non-skid mats or decals in the tub or shower.  If you  need to sit down in the shower, use a plastic, non-slip stool.  Keep the floor dry. Clean up any water that spills on the floor as soon as it happens.  Remove soap buildup in the tub or shower regularly.  Attach bath mats securely with double-sided non-slip rug tape.  Do not have throw rugs and other things on the floor that can make you trip. What can I do in the bedroom?  Use night lights.  Make sure that you have a light by your bed that is easy to reach.  Do not use any sheets or blankets that are too big for your bed. They should not hang down onto the floor.  Have a firm chair that has side arms. You can use this for support while you get dressed.  Do not have throw rugs and other things on the floor that can make you trip. What can I do in the kitchen?  Clean up any spills right away.  Avoid walking on wet floors.  Keep items that you use a lot in easy-to-reach places.  If you need to reach something above you, use a strong step stool that has a grab bar.  Keep electrical cords out of the way.  Do not use floor polish or wax that makes floors slippery. If you must use wax, use non-skid floor wax.  Do not have throw rugs and other things on the floor that can make you trip. What can I do with my stairs?  Do not leave any items on the stairs.  Make sure that there are handrails on both sides of the stairs and use them. Fix handrails that are broken or loose. Make sure that handrails are as long as the stairways.  Check any carpeting to make sure that it is firmly attached to the stairs. Fix any carpet that is loose or worn.  Avoid having throw rugs at the top or bottom of the stairs. If you do have throw rugs, attach them to the floor with carpet tape.  Make sure that you have a light switch at the top of the stairs and the bottom of the stairs. If you do not have them, ask someone to add them for you. What else can I do to help prevent falls?  Wear shoes  that:  Do not have high heels.  Have rubber bottoms.  Are comfortable and fit you well.  Are closed at the toe. Do not wear sandals.  If you use a stepladder:  Make sure that it is fully opened. Do not climb a closed stepladder.  Make sure that both sides of the stepladder are locked into place.  Ask someone to hold it for you, if possible.  Clearly mark and make sure that you can see:  Any grab bars or handrails.  First and last steps.  Where the edge of each step is.  Use tools that help you move around (mobility aids) if they are needed. These include:  Canes.  Walkers.  Scooters.  Crutches.  Turn on the lights when you go into a dark area. Replace any light bulbs as soon as they burn out.  Set up your furniture so you have a clear path. Avoid moving your furniture around.  If any of your floors are uneven, fix them.  If there are any pets around you, be aware of where they are.  Review your medicines with your doctor. Some medicines can make you feel dizzy. This can increase your chance of falling. Ask your doctor what other things that you can do to help prevent falls. This information is not intended to replace advice given to you by your health care provider. Make sure you discuss any questions you have with your health care provider. Document Released: 01/04/2009 Document Revised: 08/16/2015 Document Reviewed: 04/14/2014 Elsevier Interactive Patient Education  2017 [employer].

## 2020-11-23 ENCOUNTER — Other Ambulatory Visit: Payer: Self-pay

## 2020-11-23 ENCOUNTER — Encounter: Payer: Self-pay | Admitting: Family Medicine

## 2020-11-23 ENCOUNTER — Ambulatory Visit (INDEPENDENT_AMBULATORY_CARE_PROVIDER_SITE_OTHER): Payer: Medicare HMO | Admitting: Family Medicine

## 2020-11-23 VITALS — BP 130/80 | HR 60 | Ht 71.0 in | Wt 160.0 lb

## 2020-11-23 DIAGNOSIS — E782 Mixed hyperlipidemia: Secondary | ICD-10-CM | POA: Diagnosis not present

## 2020-11-23 DIAGNOSIS — N401 Enlarged prostate with lower urinary tract symptoms: Secondary | ICD-10-CM | POA: Diagnosis not present

## 2020-11-23 DIAGNOSIS — R69 Illness, unspecified: Secondary | ICD-10-CM | POA: Diagnosis not present

## 2020-11-23 DIAGNOSIS — F5102 Adjustment insomnia: Secondary | ICD-10-CM | POA: Diagnosis not present

## 2020-11-23 DIAGNOSIS — Z23 Encounter for immunization: Secondary | ICD-10-CM | POA: Diagnosis not present

## 2020-11-23 DIAGNOSIS — I10 Essential (primary) hypertension: Secondary | ICD-10-CM

## 2020-11-23 MED ORDER — AMLODIPINE BESYLATE 5 MG PO TABS: 5.0000 mg | ORAL_TABLET | Freq: Every day | ORAL | 1 refills | Status: DC

## 2020-11-23 MED ORDER — METOPROLOL SUCCINATE ER 25 MG PO TB24: ORAL_TABLET | ORAL | 1 refills | Status: DC

## 2020-11-23 MED ORDER — ATORVASTATIN CALCIUM 40 MG PO TABS: 40.0000 mg | ORAL_TABLET | Freq: Every day | ORAL | 1 refills | Status: DC

## 2020-11-23 MED ORDER — TAMSULOSIN HCL 0.4 MG PO CAPS: 0.4000 mg | ORAL_CAPSULE | Freq: Every day | ORAL | 1 refills | Status: DC

## 2020-11-23 NOTE — Progress Notes (Signed)
Date:  11/23/2020   Name:  Stuart Jenkins   DOB:  Nov 03, 1940   MRN:  [id number]   Chief Complaint: Flu Vaccine, Insomnia, Benign Prostatic Hypertrophy, Hypertension, and Hyperlipidemia  Insomnia Primary symptoms: no fragmented sleep, no sleep disturbance, no difficulty falling asleep, no somnolence, no frequent awakening, no premature morning awakening, no malaise/fatigue, no napping.   The problem occurs rarely. The symptoms are relieved by medication. The treatment provided moderate relief.  Benign Prostatic Hypertrophy This is a chronic problem. The problem has been gradually improving since onset. Irritative symptoms include nocturia. Irritative symptoms do not include frequency or urgency. Obstructive symptoms include dribbling. Obstructive symptoms do not include incomplete emptying, an intermittent stream or straining. Associated symptoms include chills. Pertinent negatives include no dysuria, genital pain, hematuria, hesitancy, nausea or vomiting. Past treatments include tamsulosin.  Hypertension This is a chronic problem. The current episode started more than 1 year ago. Pertinent negatives include no anxiety, blurred vision, chest pain, headaches, malaise/fatigue, neck pain, orthopnea, palpitations, peripheral edema, shortness of breath or sweats. Past treatments include calcium channel blockers and beta blockers. There are no compliance problems.  There is no history of angina, kidney disease, CAD/MI, CVA, heart failure, left ventricular hypertrophy, PVD or retinopathy. There is no history of chronic renal disease, a hypertension causing med or renovascular disease.  Hyperlipidemia This is a chronic problem. The current episode started more than 1 year ago. He has no history of chronic renal disease, diabetes, hypothyroidism or obesity. Pertinent negatives include no chest pain, focal weakness, leg pain, myalgias or shortness of breath. Current antihyperlipidemic treatment includes  statins.   Lab Results  Component Value Date   CREATININE 1.03 06/07/2020   BUN 14 06/07/2020   NA 139 06/07/2020   K 4.8 06/07/2020   CL 101 06/07/2020   CO2 23 06/07/2020   Lab Results  Component Value Date   CHOL 88 (L) 12/20/2019   HDL 29 (L) 12/20/2019   LDLCALC 32 12/20/2019   TRIG 163 (H) 12/20/2019   CHOLHDL 3.1 01/31/2019   No results found for: TSH Lab Results  Component Value Date   HGBA1C 5.5 12/24/2014   Lab Results  Component Value Date   WBC 4.3 12/23/2014   HGB 9.9 (L) 12/24/2014   HCT 31.7 (L) 12/23/2014   MCV 90.5 12/23/2014   PLT 384 12/23/2014   Lab Results  Component Value Date   ALT 42 12/20/2019   AST 37 12/20/2019   ALKPHOS 99 12/20/2019   BILITOT 0.5 12/20/2019     Review of Systems  Constitutional:  Positive for chills. Negative for fever and malaise/fatigue.  HENT:  Negative for drooling, ear discharge, ear pain and sore throat.   Eyes:  Negative for blurred vision.  Respiratory:  Negative for cough, shortness of breath and wheezing.   Cardiovascular:  Negative for chest pain, palpitations, orthopnea and leg swelling.  Gastrointestinal:  Negative for abdominal pain, blood in stool, constipation, diarrhea, nausea and vomiting.  Endocrine: Negative for polydipsia.  Genitourinary:  Positive for nocturia. Negative for dysuria, frequency, hematuria, hesitancy, incomplete emptying and urgency.  Musculoskeletal:  Negative for back pain, myalgias and neck pain.  Skin:  Negative for rash.  Allergic/Immunologic: Negative for environmental allergies.  Neurological:  Negative for dizziness, focal weakness and headaches.  Hematological:  Does not bruise/bleed easily.  Psychiatric/Behavioral:  Negative for sleep disturbance and suicidal ideas. The patient has insomnia. The patient is not nervous/anxious.    Patient Active Problem List  Diagnosis Date Noted   Pain in limb 02/12/2018   Coronary artery disease involving coronary bypass graft of  native heart without angina pectoris 07/30/2017   Mixed hyperlipidemia 09/11/2016   Benign prostatic hyperplasia with lower urinary tract symptoms 09/11/2016   Hematospermia 07/10/2015   Essential hypertension 06/12/2015   Cervical disc disorder 04/10/2015   Hypotension 12/24/2014   Hyponatremia 12/24/2014   Anemia 12/24/2014   Hyperglycemia 12/24/2014   Syncopal episodes 12/23/2014   Acute postoperative pain 12/07/2014   Cigarette smoker 12/06/2014   Primary localized osteoarthrosis, hand 11/17/2013   Chronic prostatitis 03/09/2012    Allergies  Allergen Reactions   Oxycodone Hcl Other (See Comments)    Altered mental status   Sulfa Antibiotics Rash    Past Surgical History:  Procedure Laterality Date   APPENDECTOMY     CARDIAC CATHETERIZATION N/A 11/08/2014   Procedure: Left Heart Cath and Coronary Angiography;  Surgeon: Yolonda Kida, MD;  Location: [facility];  Service: Cardiovascular;  Laterality: N/A;   CARDIOVERSION     CATARACT EXTRACTION W/PHACO Right 06/01/2018   Procedure: CATARACT EXTRACTION PHACO AND INTRAOCULAR LENS PLACEMENT ([place]) RIGHT;  Surgeon: Birder Robson, MD;  Location: [facility];  Service: Ophthalmology;  Laterality: Right;  [place] 00:50.5 CDE 7.01 Fluid Pack Lot # [id number]   CATARACT EXTRACTION W/PHACO Left 08/10/2018   Procedure: CATARACT EXTRACTION PHACO AND INTRAOCULAR LENS PLACEMENT ([place])  COMPLICATED LEFT MALYUGIN;  Surgeon: Birder Robson, MD;  Location: [facility];  Service: Ophthalmology;  Laterality: Left;  MALYUGIN   COLONOSCOPY     COLONOSCOPY WITH PROPOFOL N/A 02/23/2017   Procedure: COLONOSCOPY WITH PROPOFOL;  Surgeon: Lollie Sails, MD;  Location: [facility];  Service: Endoscopy;  Laterality: N/A;   CORONARY ANGIOPLASTY     CORONARY ARTERY BYPASS GRAFT     ELECTROPHYSIOLOGIC STUDY N/A 01/30/2015   Procedure: Cardioversion;  Surgeon: Yolonda Kida, MD;  Location: [facility];  Service: Cardiovascular;   Laterality: N/A;   FRACTURE SURGERY Left    arm, knee, collar bone   FRACTURE SURGERY     GASTRIC RESECTION     partial   open heart surgery  12/05/2014    Social History   Tobacco Use   Smoking status: Former    Types: Cigarettes    Quit date: 11/07/1984    Years since quitting: 36.0   Smokeless tobacco: Never  Vaping Use   Vaping Use: Never used  Substance Use Topics   Alcohol use: Yes    Comment: less than 1 drink/week   Drug use: No     Medication list has been reviewed and updated.  Current Meds  Medication Sig   amLODipine (NORVASC) 5 MG tablet Take 1 tablet (5 mg total) by mouth daily.   aspirin EC 81 MG tablet Take 1 tablet (81 mg total) by mouth daily.   atorvastatin (LIPITOR) 40 MG tablet Take 1 tablet (40 mg total) by mouth daily.   metoprolol succinate (TOPROL-XL) 25 MG 24 hr tablet One a day   tamsulosin (FLOMAX) 0.4 MG CAPS capsule Take 1 capsule (0.4 mg total) by mouth daily.   traZODone (DESYREL) 50 MG tablet TAKE 1/2-1 TABLETS BY MOUTH AT BEDTIME AS NEEDED FOR SLEEP.    PHQ 2/9 Scores 11/23/2020 07/30/2020 06/07/2020 03/08/2020  PHQ - 2 Score 0 0 0 0  PHQ- 9 Score 0 - 0 0    GAD 7 : Generalized Anxiety Score 11/23/2020 06/07/2020 03/08/2020 12/20/2019  Nervous, Anxious, on Edge 0 0  0 0  Control/stop worrying 0 0 0 2  Worry too much - different things 0 0 0 2  Trouble relaxing 0 0 0 0  Restless 0 0 0 0  Easily annoyed or irritable 0 0 0 0  Afraid - awful might happen 0 0 0 0  Total GAD 7 Score 0 0 0 4  Anxiety Difficulty - - - Somewhat difficult    BP Readings from Last 3 Encounters:  07/30/20 130/80  06/07/20 120/80  03/08/20 130/78    Physical Exam Vitals and nursing note reviewed.  HENT:     Head: Normocephalic.     Right Ear: Tympanic membrane, ear canal and external ear normal. There is no impacted cerumen.     Left Ear: Tympanic membrane, ear canal and external ear normal. There is no impacted cerumen.     Nose: Nose normal. No congestion  or rhinorrhea.  Eyes:     General: No scleral icterus.       Right eye: No discharge.        Left eye: No discharge.     Conjunctiva/sclera: Conjunctivae normal.     Pupils: Pupils are equal, round, and reactive to light.  Neck:     Thyroid: No thyromegaly.     Vascular: No JVD.     Trachea: No tracheal deviation.  Cardiovascular:     Rate and Rhythm: Normal rate and regular rhythm.     Heart sounds: Normal heart sounds, S1 normal and S2 normal. No murmur heard. No systolic murmur is present.  No diastolic murmur is present.    No friction rub. No gallop. No S3 or S4 sounds.  Pulmonary:     Effort: No respiratory distress.     Breath sounds: Normal breath sounds. No decreased breath sounds, wheezing, rhonchi or rales.  Abdominal:     General: Bowel sounds are normal.     Palpations: Abdomen is soft. There is no mass.     Tenderness: There is no abdominal tenderness. There is no guarding or rebound.  Musculoskeletal:        General: No tenderness. Normal range of motion.     Cervical back: Normal range of motion and neck supple.     Right lower leg: No edema.     Left lower leg: No edema.  Lymphadenopathy:     Cervical: No cervical adenopathy.  Skin:    General: Skin is warm.     Findings: No rash.  Neurological:     Mental Status: He is alert and oriented to person, place, and time.     Cranial Nerves: No cranial nerve deficit.     Deep Tendon Reflexes: Reflexes are normal and symmetric.    Wt Readings from Last 3 Encounters:  11/23/20 160 lb (72.6 kg)  07/30/20 158 lb 12.8 oz (72 kg)  06/07/20 162 lb (73.5 kg)    Ht '5\' 11"'$  (1.803 m)   Wt 160 lb (72.6 kg)   BMI 22.32 kg/m   Assessment and Plan:  1. Essential hypertension Chronic.  Controlled.  Stable.  Blood pressure today is 130/80.  Continue amlodipine 5 mg and metoprolol XL 25 mg once a day.  Check CMP for electrolytes and GFR. - amLODipine (NORVASC) 5 MG tablet; Take 1 tablet (5 mg total) by mouth daily.   Dispense: 90 tablet; Refill: 1 - metoprolol succinate (TOPROL-XL) 25 MG 24 hr tablet; One a day  Dispense: 90 tablet; Refill: 1 - Comprehensive Metabolic Panel (CMET)  2.  Mixed hyperlipidemia .  Controlled.  Stable.  Continue atorvastatin 40 mg daily.  With checking of LDL with lipid panel for adjustment. - atorvastatin (LIPITOR) 40 MG tablet; Take 1 tablet (40 mg total) by mouth daily.  Dispense: 90 tablet; Refill: 1 - Lipid Panel With LDL/HDL Ratio  3. Benign prostatic hyperplasia with lower urinary tract symptoms, symptom details unspecified .  Controlled.  Stable.  Symptomatically patient is doing better we will continue tamsulosin 0.4 mg once a day. - tamsulosin (FLOMAX) 0.4 MG CAPS capsule; Take 1 capsule (0.4 mg total) by mouth daily.  Dispense: 90 capsule; Refill: 1  4. Adjustment insomnia And has had insomnia in the past but is doing well is not necessitating taking medication such as his trazodone.  Patient is only having to use a Tylenol PM as needed.  5. Need for immunization against influenza Discussed and administered. - Flu Vaccine QUAD High Dose(Fluad)

## 2020-11-24 LAB — COMPREHENSIVE METABOLIC PANEL
ALT: 45 IU/L — ABNORMAL HIGH (ref 0–44)
AST: 46 IU/L — ABNORMAL HIGH (ref 0–40)
Albumin/Globulin Ratio: 1.6 (ref 1.2–2.2)
Albumin: 4.3 g/dL (ref 3.7–4.7)
Alkaline Phosphatase: 104 IU/L (ref 44–121)
BUN/Creatinine Ratio: 11 (ref 10–24)
BUN: 12 mg/dL (ref 8–27)
Bilirubin Total: 0.5 mg/dL (ref 0.0–1.2)
CO2: 24 mmol/L (ref 20–29)
Calcium: 9.4 mg/dL (ref 8.6–10.2)
Chloride: 102 mmol/L (ref 96–106)
Creatinine, Ser: 1.11 mg/dL (ref 0.76–1.27)
Globulin, Total: 2.7 g/dL (ref 1.5–4.5)
Glucose: 90 mg/dL (ref 65–99)
Potassium: 4.5 mmol/L (ref 3.5–5.2)
Sodium: 139 mmol/L (ref 134–144)
Total Protein: 7 g/dL (ref 6.0–8.5)
eGFR: 68 mL/min/{1.73_m2} (ref >59)

## 2020-11-24 LAB — LIPID PANEL WITH LDL/HDL RATIO
Cholesterol, Total: 86 mg/dL — ABNORMAL LOW (ref 100–199)
HDL: 29 mg/dL — ABNORMAL LOW (ref >39)
LDL Chol Calc (NIH): 35 mg/dL (ref 0–99)
LDL/HDL Ratio: 1.2 ratio (ref 0.0–3.6)
Triglycerides: 122 mg/dL (ref 0–149)
VLDL Cholesterol Cal: 22 mg/dL (ref 5–40)

## 2020-12-31 DIAGNOSIS — I2510 Atherosclerotic heart disease of native coronary artery without angina pectoris: Secondary | ICD-10-CM | POA: Diagnosis not present

## 2020-12-31 DIAGNOSIS — Z720 Tobacco use: Secondary | ICD-10-CM | POA: Diagnosis not present

## 2020-12-31 DIAGNOSIS — Z951 Presence of aortocoronary bypass graft: Secondary | ICD-10-CM | POA: Diagnosis not present

## 2020-12-31 DIAGNOSIS — K279 Peptic ulcer, site unspecified, unspecified as acute or chronic, without hemorrhage or perforation: Secondary | ICD-10-CM | POA: Diagnosis not present

## 2020-12-31 DIAGNOSIS — E782 Mixed hyperlipidemia: Secondary | ICD-10-CM | POA: Diagnosis not present

## 2020-12-31 DIAGNOSIS — R001 Bradycardia, unspecified: Secondary | ICD-10-CM | POA: Diagnosis not present

## 2020-12-31 DIAGNOSIS — I10 Essential (primary) hypertension: Secondary | ICD-10-CM | POA: Diagnosis not present

## 2021-01-28 DIAGNOSIS — M7541 Impingement syndrome of right shoulder: Secondary | ICD-10-CM | POA: Diagnosis not present

## 2021-02-27 DIAGNOSIS — Z872 Personal history of diseases of the skin and subcutaneous tissue: Secondary | ICD-10-CM | POA: Diagnosis not present

## 2021-02-27 DIAGNOSIS — D485 Neoplasm of uncertain behavior of skin: Secondary | ICD-10-CM | POA: Diagnosis not present

## 2021-02-27 DIAGNOSIS — Z859 Personal history of malignant neoplasm, unspecified: Secondary | ICD-10-CM | POA: Diagnosis not present

## 2021-02-27 DIAGNOSIS — L578 Other skin changes due to chronic exposure to nonionizing radiation: Secondary | ICD-10-CM | POA: Diagnosis not present

## 2021-02-27 DIAGNOSIS — L853 Xerosis cutis: Secondary | ICD-10-CM | POA: Diagnosis not present

## 2021-05-23 ENCOUNTER — Other Ambulatory Visit: Payer: Self-pay

## 2021-05-23 ENCOUNTER — Encounter: Payer: Self-pay | Admitting: Family Medicine

## 2021-05-23 ENCOUNTER — Ambulatory Visit (INDEPENDENT_AMBULATORY_CARE_PROVIDER_SITE_OTHER): Payer: Medicare HMO | Admitting: Family Medicine

## 2021-05-23 VITALS — BP 120/80 | HR 76 | Ht 71.0 in | Wt 169.0 lb

## 2021-05-23 DIAGNOSIS — N401 Enlarged prostate with lower urinary tract symptoms: Secondary | ICD-10-CM | POA: Diagnosis not present

## 2021-05-23 DIAGNOSIS — F5102 Adjustment insomnia: Secondary | ICD-10-CM

## 2021-05-23 DIAGNOSIS — R69 Illness, unspecified: Secondary | ICD-10-CM | POA: Diagnosis not present

## 2021-05-23 DIAGNOSIS — E782 Mixed hyperlipidemia: Secondary | ICD-10-CM

## 2021-05-23 DIAGNOSIS — I10 Essential (primary) hypertension: Secondary | ICD-10-CM

## 2021-05-23 MED ORDER — ATORVASTATIN CALCIUM 40 MG PO TABS: 40.0000 mg | ORAL_TABLET | Freq: Every day | ORAL | 1 refills | Status: DC

## 2021-05-23 MED ORDER — TAMSULOSIN HCL 0.4 MG PO CAPS: 0.4000 mg | ORAL_CAPSULE | Freq: Every day | ORAL | 1 refills | Status: DC

## 2021-05-23 MED ORDER — METOPROLOL SUCCINATE ER 25 MG PO TB24: ORAL_TABLET | ORAL | 1 refills | Status: DC

## 2021-05-23 MED ORDER — AMLODIPINE BESYLATE 5 MG PO TABS: 5.0000 mg | ORAL_TABLET | Freq: Every day | ORAL | 1 refills | Status: DC

## 2021-05-23 NOTE — Progress Notes (Signed)
Date:  05/23/2021   Name:  Stuart Jenkins   DOB:  15-Dec-1940   MRN:  [id number]   Chief Complaint: Benign Prostatic Hypertrophy, Hypertension, and Hyperlipidemia  Benign Prostatic Hypertrophy This is a chronic problem. The current episode started more than 1 year ago. The problem has been gradually worsening since onset. Irritative symptoms do not include frequency, nocturia or urgency. Obstructive symptoms do not include dribbling, incomplete emptying, an intermittent stream, a slower stream, straining or a weak stream. Pertinent negatives include no chills, dysuria, genital pain, hematuria, hesitancy, nausea or vomiting. Nothing aggravates the symptoms. Past treatments include tamsulosin. The treatment provided moderate relief.  Hypertension This is a chronic problem. The problem has been gradually improving since onset. The problem is controlled. Pertinent negatives include no anxiety, blurred vision, chest pain, headaches, malaise/fatigue, neck pain, orthopnea, palpitations, peripheral edema, PND, shortness of breath or sweats. There are no associated agents to hypertension. Past treatments include beta blockers and calcium channel blockers. The current treatment provides moderate improvement. There are no compliance problems.  There is no history of angina, kidney disease, CVA, heart failure, PVD or retinopathy. There is no history of chronic renal disease, a hypertension causing med or renovascular disease.  Hyperlipidemia This is a chronic problem. The current episode started more than 1 year ago. The problem is controlled. Recent lipid tests were reviewed and are normal. He has no history of chronic renal disease, diabetes, hypothyroidism, liver disease, obesity or nephrotic syndrome. Factors aggravating his hyperlipidemia include thiazides. Pertinent negatives include no chest pain, focal sensory loss, focal weakness, leg pain, myalgias or shortness of breath. Current antihyperlipidemic  treatment includes statins. The current treatment provides moderate improvement of lipids. There are no compliance problems.    Lab Results  Component Value Date   NA 139 11/23/2020   K 4.5 11/23/2020   CO2 24 11/23/2020   GLUCOSE 90 11/23/2020   BUN 12 11/23/2020   CREATININE 1.11 11/23/2020   CALCIUM 9.4 11/23/2020   EGFR 68 11/23/2020   GFRNONAA 69 12/20/2019   Lab Results  Component Value Date   CHOL 86 (L) 11/23/2020   HDL 29 (L) 11/23/2020   LDLCALC 35 11/23/2020   TRIG 122 11/23/2020   CHOLHDL 3.1 01/31/2019   No results found for: TSH Lab Results  Component Value Date   HGBA1C 5.5 12/24/2014   Lab Results  Component Value Date   WBC 4.3 12/23/2014   HGB 9.9 (L) 12/24/2014   HCT 31.7 (L) 12/23/2014   MCV 90.5 12/23/2014   PLT 384 12/23/2014   Lab Results  Component Value Date   ALT 45 (H) 11/23/2020   AST 46 (H) 11/23/2020   ALKPHOS 104 11/23/2020   BILITOT 0.5 11/23/2020   No results found for: 25OHVITD2, 25OHVITD3, VD25OH   Review of Systems  Constitutional:  Negative for chills, fever and malaise/fatigue.  HENT:  Negative for drooling, ear discharge, ear pain and sore throat.   Eyes:  Negative for blurred vision.  Respiratory:  Negative for cough, shortness of breath and wheezing.   Cardiovascular:  Negative for chest pain, palpitations, orthopnea, leg swelling and PND.  Gastrointestinal:  Negative for abdominal pain, blood in stool, constipation, diarrhea, nausea and vomiting.  Endocrine: Negative for polydipsia.  Genitourinary:  Negative for dysuria, frequency, hematuria, hesitancy, incomplete emptying, nocturia and urgency.  Musculoskeletal:  Negative for back pain, myalgias and neck pain.  Skin:  Negative for rash.  Allergic/Immunologic: Negative for environmental allergies.  Neurological:  Negative  for dizziness, focal weakness and headaches.  Hematological:  Does not bruise/bleed easily.  Psychiatric/Behavioral:  Negative for suicidal ideas.  The patient is not nervous/anxious.    Patient Active Problem List   Diagnosis Date Noted   Pain in limb 02/12/2018   Coronary artery disease involving coronary bypass graft of native heart without angina pectoris 07/30/2017   Mixed hyperlipidemia 09/11/2016   Benign prostatic hyperplasia with lower urinary tract symptoms 09/11/2016   Hematospermia 07/10/2015   Essential hypertension 06/12/2015   Cervical disc disorder 04/10/2015   Hypotension 12/24/2014   Hyponatremia 12/24/2014   Anemia 12/24/2014   Hyperglycemia 12/24/2014   Syncopal episodes 12/23/2014   Acute postoperative pain 12/07/2014   Cigarette smoker 12/06/2014   Primary localized osteoarthrosis, hand 11/17/2013   Chronic prostatitis 03/09/2012    Allergies  Allergen Reactions   Oxycodone Hcl Other (See Comments)    Altered mental status   Sulfa Antibiotics Rash    Past Surgical History:  Procedure Laterality Date   APPENDECTOMY     CARDIAC CATHETERIZATION N/A 11/08/2014   Procedure: Left Heart Cath and Coronary Angiography;  Surgeon: Yolonda Kida, MD;  Location: [place] [facility];  Service: Cardiovascular;  Laterality: N/A;   CARDIOVERSION     CATARACT EXTRACTION W/PHACO Right 06/01/2018   Procedure: CATARACT EXTRACTION PHACO AND INTRAOCULAR LENS PLACEMENT ([place]) RIGHT;  Surgeon: Birder Robson, MD;  Location: [facility];  Service: Ophthalmology;  Laterality: Right;  [place] 00:50.5 CDE 7.01 Fluid Pack Lot # [id number]   CATARACT EXTRACTION W/PHACO Left 08/10/2018   Procedure: CATARACT EXTRACTION PHACO AND INTRAOCULAR LENS PLACEMENT ([place])  COMPLICATED LEFT MALYUGIN;  Surgeon: Birder Robson, MD;  Location: [place];  Service: Ophthalmology;  Laterality: Left;  MALYUGIN   COLONOSCOPY     COLONOSCOPY WITH PROPOFOL N/A 02/23/2017   Procedure: COLONOSCOPY WITH PROPOFOL;  Surgeon: Lollie Sails, MD;  Location: [facility];  Service: Endoscopy;  Laterality: N/A;   CORONARY ANGIOPLASTY      CORONARY ARTERY BYPASS GRAFT     ELECTROPHYSIOLOGIC STUDY N/A 01/30/2015   Procedure: Cardioversion;  Surgeon: Yolonda Kida, MD;  Location: [facility];  Service: Cardiovascular;  Laterality: N/A;   FRACTURE SURGERY Left    arm, knee, collar bone   FRACTURE SURGERY     GASTRIC RESECTION     partial   open heart surgery  12/05/2014    Social History   Tobacco Use   Smoking status: Former    Types: Cigarettes    Quit date: 11/07/1984    Years since quitting: 36.5   Smokeless tobacco: Never  Vaping Use   Vaping Use: Never used  Substance Use Topics   Alcohol use: Yes    Comment: less than 1 drink/week   Drug use: No     Medication list has been reviewed and updated.  Current Meds  Medication Sig   amLODipine (NORVASC) 5 MG tablet Take 1 tablet (5 mg total) by mouth daily.   aspirin EC 81 MG tablet Take 1 tablet (81 mg total) by mouth daily.   atorvastatin (LIPITOR) 40 MG tablet Take 1 tablet (40 mg total) by mouth daily.   metoprolol succinate (TOPROL-XL) 25 MG 24 hr tablet One a day   tamsulosin (FLOMAX) 0.4 MG CAPS capsule Take 1 capsule (0.4 mg total) by mouth daily.    PHQ 2/9 Scores 05/23/2021 11/23/2020 07/30/2020 06/07/2020  PHQ - 2 Score 0 0 0 0  PHQ- 9 Score 1 0 - 0    GAD  7 : Generalized Anxiety Score 05/23/2021 11/23/2020 06/07/2020 03/08/2020  Nervous, Anxious, on Edge 0 0 0 0  Control/stop worrying 0 0 0 0  Worry too much - different things 0 0 0 0  Trouble relaxing 0 0 0 0  Restless 0 0 0 0  Easily annoyed or irritable 0 0 0 0  Afraid - awful might happen 0 0 0 0  Total GAD 7 Score 0 0 0 0  Anxiety Difficulty Not difficult at all - - -    BP Readings from Last 3 Encounters:  05/23/21 120/80  11/23/20 130/80  07/30/20 130/80    Physical Exam Vitals and nursing note reviewed.  HENT:     Head: Normocephalic.     Right Ear: Tympanic membrane, ear canal and external ear normal.     Left Ear: Tympanic membrane, ear canal and external ear normal.     Nose:  Nose normal. No congestion or rhinorrhea.     Mouth/Throat:     Mouth: Mucous membranes are moist.  Eyes:     General: No scleral icterus.       Right eye: No discharge.        Left eye: No discharge.     Conjunctiva/sclera: Conjunctivae normal.     Pupils: Pupils are equal, round, and reactive to light.  Neck:     Thyroid: No thyromegaly.     Vascular: No JVD.     Trachea: No tracheal deviation.  Cardiovascular:     Rate and Rhythm: Normal rate and regular rhythm.     Heart sounds: Normal heart sounds. No murmur heard.   No friction rub. No gallop.  Pulmonary:     Effort: No respiratory distress.     Breath sounds: Normal breath sounds. No wheezing, rhonchi or rales.  Abdominal:     General: Bowel sounds are normal.     Palpations: Abdomen is soft. There is no hepatomegaly, splenomegaly, mass or pulsatile mass.     Tenderness: There is no abdominal tenderness. There is no guarding or rebound.  Musculoskeletal:        General: No tenderness. Normal range of motion.     Cervical back: Normal range of motion and neck supple.  Lymphadenopathy:     Cervical: No cervical adenopathy.  Skin:    General: Skin is warm.     Findings: No rash.  Neurological:     Mental Status: He is alert and oriented to person, place, and time.     Cranial Nerves: No cranial nerve deficit.     Deep Tendon Reflexes: Reflexes are normal and symmetric.    Wt Readings from Last 3 Encounters:  05/23/21 169 lb (76.7 kg)  11/23/20 160 lb (72.6 kg)  07/30/20 158 lb 12.8 oz (72 kg)    BP 120/80    Pulse 76    Ht _0  (1.803 m)    Wt 169 lb (76.7 kg)    BMI 23.57 kg/m   Assessment and Plan:  1. Essential hypertension Chronic.  Controlled.  Stable.  Blood pressure 120/80.  Continue amlodipine 5 mg once a day and metoprolol XL 25 mg once a day.  We will recheck in 6 months.  Review of renal panel notes normal electrolytes and creatinine. - amLODipine (NORVASC) 5 MG tablet; Take 1 tablet (5 mg total) by  mouth daily.  Dispense: 90 tablet; Refill: 1 - metoprolol succinate (TOPROL-XL) 25 MG 24 hr tablet; One a day  Dispense: 90 tablet; Refill: 1  2. Mixed hyperlipidemia Chronic.  Controlled.  Stable.  Continue atorvastatin 40 mg once a day.  Review of previous lipid panel is acceptable. - atorvastatin (LIPITOR) 40 MG tablet; Take 1 tablet (40 mg total) by mouth daily.  Dispense: 90 tablet; Refill: 1  3. Benign prostatic hyperplasia with lower urinary tract symptoms, symptom details unspecified Chronic.  Controlled.  Stable.  Except for nocturia patient is doing well with current dosing of tamsulosin 0.4 mg once a day. - tamsulosin (FLOMAX) 0.4 MG CAPS capsule; Take 1 capsule (0.4 mg total) by mouth daily.  Dispense: 90 capsule; Refill: 1  4. Adjustment insomnia Chronic.  Controlled.  Stable.  Continue trazodone at current dosing of 50 mg 1/2 to 1 tablet nightly.Marland Kitchen

## 2021-07-11 DIAGNOSIS — Z961 Presence of intraocular lens: Secondary | ICD-10-CM | POA: Diagnosis not present

## 2021-07-31 ENCOUNTER — Ambulatory Visit: Payer: Medicare HMO

## 2021-07-31 DIAGNOSIS — E782 Mixed hyperlipidemia: Secondary | ICD-10-CM | POA: Diagnosis not present

## 2021-07-31 DIAGNOSIS — R001 Bradycardia, unspecified: Secondary | ICD-10-CM | POA: Diagnosis not present

## 2021-07-31 DIAGNOSIS — Z951 Presence of aortocoronary bypass graft: Secondary | ICD-10-CM | POA: Diagnosis not present

## 2021-07-31 DIAGNOSIS — Z87891 Personal history of nicotine dependence: Secondary | ICD-10-CM | POA: Diagnosis not present

## 2021-07-31 DIAGNOSIS — I959 Hypotension, unspecified: Secondary | ICD-10-CM | POA: Diagnosis not present

## 2021-07-31 DIAGNOSIS — I2510 Atherosclerotic heart disease of native coronary artery without angina pectoris: Secondary | ICD-10-CM | POA: Diagnosis not present

## 2021-07-31 DIAGNOSIS — I10 Essential (primary) hypertension: Secondary | ICD-10-CM | POA: Diagnosis not present

## 2021-08-28 DIAGNOSIS — L578 Other skin changes due to chronic exposure to nonionizing radiation: Secondary | ICD-10-CM | POA: Diagnosis not present

## 2021-08-28 DIAGNOSIS — L570 Actinic keratosis: Secondary | ICD-10-CM | POA: Diagnosis not present

## 2021-08-28 DIAGNOSIS — Z872 Personal history of diseases of the skin and subcutaneous tissue: Secondary | ICD-10-CM | POA: Diagnosis not present

## 2021-08-28 DIAGNOSIS — L853 Xerosis cutis: Secondary | ICD-10-CM | POA: Diagnosis not present

## 2021-08-28 DIAGNOSIS — Z859 Personal history of malignant neoplasm, unspecified: Secondary | ICD-10-CM | POA: Diagnosis not present

## 2021-09-03 ENCOUNTER — Telehealth: Payer: Self-pay

## 2021-09-03 NOTE — Telephone Encounter (Signed)
Copied from [place]. Topic: General - Inquiry >> Sep 03, 2021 10:56 AM Marcellus Scott wrote: Reason for CRM: Pt requesting to cancel the upcoming AWV. Declined to reschedule.

## 2021-09-03 NOTE — Telephone Encounter (Signed)
Left msg for pt that appt would be canceled and may reach me directly at [place]

## 2021-09-04 ENCOUNTER — Ambulatory Visit: Payer: Medicare HMO

## 2021-10-19 ENCOUNTER — Ambulatory Visit
Admission: EM | Admit: 2021-10-19 | Discharge: 2021-10-19 | Disposition: A | Discharge status: Home / Self Care | Payer: Medicare HMO | Attending: Emergency Medicine | Admitting: Emergency Medicine

## 2021-10-19 DIAGNOSIS — T148XXA Other injury of unspecified body region, initial encounter: Secondary | ICD-10-CM

## 2021-10-19 DIAGNOSIS — Z23 Encounter for immunization: Secondary | ICD-10-CM | POA: Diagnosis not present

## 2021-10-19 DIAGNOSIS — S50811A Abrasion of right forearm, initial encounter: Secondary | ICD-10-CM | POA: Diagnosis not present

## 2021-10-19 MED ORDER — TETANUS-DIPHTHERIA TOXOIDS TD 5-2 LFU IM INJ
0.5000 mL | INJECTION | Freq: Once | INTRAMUSCULAR | Status: AC
  Administered 2021-10-19: 0.5 mL via INTRAMUSCULAR

## 2021-10-19 MED ORDER — CEPHALEXIN 500 MG PO CAPS: 500.0000 mg | ORAL_CAPSULE | Freq: Four times a day (QID) | ORAL | 0 refills | Status: AC

## 2021-10-19 NOTE — Discharge Instructions (Signed)
Keep your abrasion clean and dry.  Take the Keflex 4 times a day woth food for 5 days to prevent infection.  Keep a dressing on your skin tear for the next 2 days until  a scab forms.  Once a scab forms leave the wound open to air and cover loosely when out in public.  If you develop any swelling, pain, drainage, red streaks up your arm, or fever you need to return for re-evaluation or see your PCP.

## 2021-10-19 NOTE — ED Provider Notes (Signed)
[facility]    CSN: [id number] Arrival date & time: 10/19/21  6213      History   Chief Complaint Chief Complaint  Patient presents with   Abrasion    HPI Stuart Jenkins is a 81 y.o. male.   HPI  81 year old male here for evaluation of skin injury.  Patient reports that he was going outside to walk his dog last night and he tripped which caused him to strike the frame of his screen door with his right forearm.  He sustained a large skin tear but states he did not completely fall, hit his head, or have a loss of consciousness.  The patient did debride the wound himself.  There is a large open, red patch of skin on the upper forearm but no active bleeding.  He is unsure when his last tetanus shot was.  Past Medical History:  Diagnosis Date   Atrial fibrillation (HCC)    BPH (benign prostatic hypertrophy)    Colon polyp    Coronary artery disease    Dysrhythmia    AFIB POST CABG / CARDIOVERSION   Gastric bleed    SURGERY   Gout    Hyperlipidemia    Hypertension    Peptic ulcer disease     Patient Active Problem List   Diagnosis Date Noted   Pain in limb 02/12/2018   Coronary artery disease involving coronary bypass graft of native heart without angina pectoris 07/30/2017   Mixed hyperlipidemia 09/11/2016   Benign prostatic hyperplasia with lower urinary tract symptoms 09/11/2016   Hematospermia 07/10/2015   Essential hypertension 06/12/2015   Cervical disc disorder 04/10/2015   Hypotension 12/24/2014   Hyponatremia 12/24/2014   Anemia 12/24/2014   Hyperglycemia 12/24/2014   Syncopal episodes 12/23/2014   Acute postoperative pain 12/07/2014   Cigarette smoker 12/06/2014   Primary localized osteoarthrosis, hand 11/17/2013   Chronic prostatitis 03/09/2012    Past Surgical History:  Procedure Laterality Date   APPENDECTOMY     CARDIAC CATHETERIZATION N/A 11/08/2014   Procedure: Left Heart Cath and Coronary Angiography;  Surgeon: Yolonda Kida, MD;  Location: [place] [facility];  Service: Cardiovascular;  Laterality: N/A;   CARDIOVERSION     CATARACT EXTRACTION W/PHACO Right 06/01/2018   Procedure: CATARACT EXTRACTION PHACO AND INTRAOCULAR LENS PLACEMENT ([place]) RIGHT;  Surgeon: Birder Robson, MD;  Location: [facility];  Service: Ophthalmology;  Laterality: Right;  [place] 00:50.5 CDE 7.01 Fluid Pack Lot # [id number]   CATARACT EXTRACTION W/PHACO Left 08/10/2018   Procedure: CATARACT EXTRACTION PHACO AND INTRAOCULAR LENS PLACEMENT ([place])  COMPLICATED LEFT MALYUGIN;  Surgeon: Birder Robson, MD;  Location: [place];  Service: Ophthalmology;  Laterality: Left;  MALYUGIN   COLONOSCOPY     COLONOSCOPY WITH PROPOFOL N/A 02/23/2017   Procedure: COLONOSCOPY WITH PROPOFOL;  Surgeon: Lollie Sails, MD;  Location: [facility] END[facility];  Service: Endoscopy;  Laterality: N/A;   CORONARY ANGIOPLASTY     CORONARY ARTERY BYPASS GRAFT     ELECTROPHYSIOLOGIC STUDY N/A 01/30/2015   Procedure: Cardioversion;  Surgeon: Yolonda Kida, MD;  Location: [facility];  Service: Cardiovascular;  Laterality: N/A;   FRACTURE SURGERY Left    arm, knee, collar bone   FRACTURE SURGERY     GASTRIC RESECTION     partial   open heart surgery  12/05/2014       Home Medications    Prior to Admission medications   Medication Sig Start Date End Date Taking? Authorizing Provider  cephALEXin (  KEFLEX) 500 MG capsule Take 1 capsule (500 mg total) by mouth 4 (four) times daily for 5 days. 10/19/21 10/24/21 Yes Margarette Canada, NP  amLODipine (NORVASC) 5 MG tablet Take 1 tablet (5 mg total) by mouth daily. 05/23/21   Juline Patch, MD  aspirin EC 81 MG tablet Take 1 tablet (81 mg total) by mouth daily. 07/30/17   Juline Patch, MD  atorvastatin (LIPITOR) 40 MG tablet Take 1 tablet (40 mg total) by mouth daily. 05/23/21   Juline Patch, MD  fluticasone (FLONASE) 50 MCG/ACT nasal spray Place 2 sprays into both nostrils daily. Patient not taking: Reported on  11/23/2020 05/09/19   Juline Patch, MD  metoprolol succinate (TOPROL-XL) 25 MG 24 hr tablet One a day 05/23/21   Juline Patch, MD  tamsulosin (FLOMAX) 0.4 MG CAPS capsule Take 1 capsule (0.4 mg total) by mouth daily. 05/23/21   Juline Patch, MD  traZODone (DESYREL) 50 MG tablet TAKE 1/2-1 TABLETS BY MOUTH AT BEDTIME AS NEEDED FOR SLEEP. Patient not taking: Reported on 05/23/2021 12/20/19   Juline Patch, MD    Family History Family History  Problem Relation Age of Onset   Hypertension Brother     Social History Social History   Tobacco Use   Smoking status: Former    Types: Cigarettes    Quit date: 11/07/1984    Years since quitting: 36.9   Smokeless tobacco: Never  Vaping Use   Vaping Use: Never used  Substance Use Topics   Alcohol use: Yes    Comment: less than 1 drink/week   Drug use: No     Allergies   Oxycodone hcl and Sulfa antibiotics   Review of Systems Review of Systems  Constitutional:  Negative for fever.  Skin:  Positive for color change and wound.  Neurological:  Negative for syncope.  Hematological: Negative.   Psychiatric/Behavioral: Negative.       Physical Exam Triage Vital Signs ED Triage Vitals [10/19/21 0834]  Enc Vitals Group     BP      Pulse      Resp      Temp      Temp src      SpO2      Weight      Height      Head Circumference      Peak Flow      Pain Score 2     Pain Loc      Pain Edu?      Excl. in [place]?    No data found.  Updated Vital Signs BP (!) 158/95 (BP Location: Left Arm)   Pulse (!) 58   Temp 97.8 F (36.6 C) (Oral)   Resp 18   SpO2 99%   Visual Acuity Right Eye Distance:   Left Eye Distance:   Bilateral Distance:    Right Eye Near:   Left Eye Near:    Bilateral Near:     Physical Exam Vitals and nursing note reviewed.  Constitutional:      Appearance: Normal appearance. He is not ill-appearing.  HENT:     Head: Normocephalic and atraumatic.  Pulmonary:     Effort: Pulmonary effort is  normal.  Musculoskeletal:        General: Signs of injury present. No swelling, tenderness or deformity. Normal range of motion.  Skin:    General: Skin is warm and dry.     Capillary Refill: Capillary refill takes less than  2 seconds.     Findings: Bruising and erythema present.  Neurological:     General: No focal deficit present.     Mental Status: He is alert and oriented to person, place, and time.  Psychiatric:        Mood and Affect: Mood normal.        Behavior: Behavior normal.        Thought Content: Thought content normal.        Judgment: Judgment normal.      UC Treatments / Results  Labs (all labs ordered are listed, but only abnormal results are displayed) Labs Reviewed - No data to display  EKG   Radiology No results found.  Procedures Procedures (including critical care time)  Medications Ordered in UC Medications  tetanus & diphtheria toxoids (adult) (TENIVAC) injection 0.5 mL (0.5 mLs Intramuscular Given 10/19/21 0853)    Initial Impression / Assessment and Plan / UC Course  I have reviewed the triage vital signs and the nursing notes.  Pertinent labs & imaging results that were available during my care of the patient were reviewed by me and considered in my medical decision making (see chart for details).  Patient is a very pleasant, nontoxic-appearing 81 year old male who is here for evaluation of a large abrasion on his right proximal lateral forearm that he sustained last night after tripping and striking the door frame of his screen door.  Patient denies syncope and he remembers entire event.  He did not completely fall, hit his head, or have loss of consciousness.  The wound itself is the size of a silver dollar and the wound bed is beefy red.  The surrounding tissue is ecchymotic but there is no edema, erythema, or warmth.  There is no drainage from the wound or active bleeding.  Patient has full range of motion and sensation in his hand and  forearm.  I scrub the wound bed and surrounding tissue with wound spray, applied bacitracin ointment to the wound bed, a nonadherent pad, secured with Coban.  There is nothing to repair as patient debrided the wound himself and cut the wound flap off.  I will place the patient on Keflex 500 mg 4 times daily for 5 days to prevent infection.  We will also update his Td as he is unsure when the last time he had a tetanus shot was.  I have given the patient wound care instructions and also return or ear precautions.  Patient verbalizes understanding.   Final Clinical Impressions(s) / UC Diagnoses   Final diagnoses:  Skin abrasion     Discharge Instructions      Keep your abrasion clean and dry.  Take the Keflex 4 times a day woth food for 5 days to prevent infection.  Keep a dressing on your skin tear for the next 2 days until  a scab forms.  Once a scab forms leave the wound open to air and cover loosely when out in public.  If you develop any swelling, pain, drainage, red streaks up your arm, or fever you need to return for re-evaluation or see your PCP.      ED Prescriptions     Medication Sig Dispense Auth. Provider   cephALEXin (KEFLEX) 500 MG capsule Take 1 capsule (500 mg total) by mouth 4 (four) times daily for 5 days. 20 capsule Margarette Canada, NP      PDMP not reviewed this encounter.   Margarette Canada, NP 10/19/21 [phone]

## 2021-10-19 NOTE — ED Triage Notes (Signed)
Pt present right arm abrasion from hitting the screen door of his home last night.

## 2021-10-21 ENCOUNTER — Ambulatory Visit (INDEPENDENT_AMBULATORY_CARE_PROVIDER_SITE_OTHER): Payer: Medicare HMO | Admitting: Family Medicine

## 2021-10-21 ENCOUNTER — Ambulatory Visit: Payer: Self-pay

## 2021-10-21 ENCOUNTER — Encounter: Payer: Self-pay | Admitting: Family Medicine

## 2021-10-21 VITALS — BP 130/82 | HR 72 | Ht 71.0 in | Wt 160.8 lb

## 2021-10-21 DIAGNOSIS — L089 Local infection of the skin and subcutaneous tissue, unspecified: Secondary | ICD-10-CM | POA: Diagnosis not present

## 2021-10-21 DIAGNOSIS — S50811A Abrasion of right forearm, initial encounter: Secondary | ICD-10-CM | POA: Diagnosis not present

## 2021-10-21 NOTE — Telephone Encounter (Signed)
    Chief Complaint: Tripped Friday night at home and hit right arm. Seen in UC with large skin tear. Today area is oozing blood. Asking to be worked in today. Appointment given per Levada Dy. Symptoms: Above Frequency: Friday night Pertinent Negatives: Patient denies fever Disposition: '[]'$ ED /'[]'$ Urgent Care (no appt availability in office) / '[x]'$ Appointment(In office/virtual)/ '[]'$  [facility]/ '[]'$ Home Care/ '[]'$ Refused Recommended Disposition /'[]'$  Mobile Bus/ '[]'$  Follow-up with PCP Additional Notes:   Reason for Disposition  [1] High-risk adult (e.g., age > 70 years, osteoporosis, chronic steroid use) AND [2] MILD to MODERATE pain  Answer Assessment - Initial Assessment Questions 1. MECHANISM: "How did the injury happen?"     Fell Saturday 2. ONSET: "When did the injury happen?" (Minutes or hours ago)      [address] at home 3. LOCATION: "Where is the injury located?" "Which arm?"     Right arm 4. APPEARANCE of INJURY: "What does the injury look like?"      Skin tear 5. SEVERITY: "Can you use the arm normally?"      Yes 6. SWELLING or BRUISING: "is there any swelling or bruising?" If Yes, ask: "How large is it? (e.g., inches, centimeters)      Bruised 7. PAIN: "Is there pain?" If Yes, ask: "How bad is the pain?"    (Scale 1-10; or mild, moderate, severe)   - NONE (0): No pain.   - MILD (1-3): Doesn't interfere with normal activities.   - MODERATE (4-7): Interferes with normal activities (e.g., work or school) or awakens from sleep.   - SEVERE (8-10): Excruciating pain, unable to do any normal activities, unable to hold a cup of water.     Mild 8. TETANUS: For any breaks in the skin, ask: "When was the last tetanus booster?"     Unsure 9. OTHER SYMPTOMS: "Do you have any other symptoms?"  (e.g., numbness in hand)     No 10. PREGNANCY: "Is there any chance you are pregnant?" "When was your last menstrual period?"       N/A  Protocols used: Arm Injury-A-AH

## 2021-10-22 DIAGNOSIS — S50811A Abrasion of right forearm, initial encounter: Secondary | ICD-10-CM | POA: Insufficient documentation

## 2021-10-22 NOTE — Progress Notes (Signed)
     Primary Care / Sports Medicine Office Visit  Patient Information:  Patient ID: Stuart Jenkins, male DOB: 1941-02-28 Age: 81 y.o. MRN: [id number]   [name] is a pleasant 81 y.o. male presenting with the following:  Chief Complaint  Patient presents with   Wound Check    Patient reports that he was going outside to walk his dog last night and he tripped which caused him to strike the frame of his screen door with his right forearm.     Vitals:   10/21/21 1603  BP: 130/82  Pulse: 72  SpO2: 99%   Vitals:   10/21/21 1603  Weight: 160 lb 12.8 oz (72.9 kg)  Height: '5\' 11"'$  (1.803 m)   Body mass index is 22.43 kg/m.  No results found.   Independent interpretation of notes and tests performed by another provider:   None  Procedures performed:   None  Pertinent History, Exam, Impression, and Recommendations:   Problem List Items Addressed This Visit       Musculoskeletal and Integument   Infected abrasion of skin of right forearm - Primary    Patient presents for wound check for right forearm skin abrasion, injury occurred last night, did go to urgent care where he was placed on antibiotics, Tdap administered.  Examination today reveals roughly 6 x 6 superficial abrasion with skin sloughed at the forearm, no fluctuance or streaking surrounding this area, granulation tissue noted at the base.  I have advised patient to continue with antibiotics, appropriate wound care guidance provided and the wound was dressed today.  Indications for further evaluation such as infection discussed.       I provided a total time of 31 minutes including both face-to-face and non-face-to-face time on 10/22/2021 inclusive of time utilized for medical chart review, information gathering, care coordination with staff, and documentation completion.   Orders & Medications No orders of the defined types were placed in this encounter.  No orders of the defined types were placed in  this encounter.    No follow-ups on file.     Montel Culver, MD   Primary Care Sports Medicine [place]

## 2021-10-22 NOTE — Assessment & Plan Note (Signed)
Patient presents for wound check for right forearm skin abrasion, injury occurred last night, did go to urgent care where he was placed on antibiotics, Tdap administered.  Examination today reveals roughly 6 x 6 superficial abrasion with skin sloughed at the forearm, no fluctuance or streaking surrounding this area, granulation tissue noted at the base.  I have advised patient to continue with antibiotics, appropriate wound care guidance provided and the wound was dressed today.  Indications for further evaluation such as infection discussed.

## 2021-11-05 ENCOUNTER — Ambulatory Visit (INDEPENDENT_AMBULATORY_CARE_PROVIDER_SITE_OTHER): Payer: Medicare HMO

## 2021-11-05 VITALS — BP 138/82 | HR 62 | Resp 17 | Ht 67.75 in | Wt 163.0 lb

## 2021-11-05 DIAGNOSIS — Z0000 Encounter for general adult medical examination without abnormal findings: Secondary | ICD-10-CM

## 2021-11-05 NOTE — Progress Notes (Cosign Needed Addendum)
Subjective:   Stuart Jenkins is a 81 y.o. male who presents for Medicare Annual/Subsequent preventive examination.  Review of Systems    Per HPI unless specifically indicated below Cardiac Risk Factors include: advanced age (>26mn, >>15women);male gender, hypertension, hyperlipidemia and CAD.          Objective:    Today's Vitals   11/05/21 1352  BP: 138/82  Pulse: 62  Resp: 17  SpO2: 98%  Weight: 163 lb (73.9 kg)  Height: 5' 7.75" (1.721 m)  PainSc: 0-No pain   Body mass index is 24.97 kg/m.     11/05/2021    2:13 PM 07/30/2020   11:27 AM 07/27/2019   11:28 AM 08/10/2018    9:46 AM 06/01/2018    9:24 AM 02/23/2017    9:45 AM 01/30/2015    6:52 AM  Advanced Directives  Does Patient Have a Medical Advance Directive? No No No No No No No  Would patient like information on creating a medical advance directive? Yes (MAU/Ambulatory/Procedural Areas - Information given) Yes (MAU/Ambulatory/Procedural Areas - Information given) Yes (MAU/Ambulatory/Procedural Areas - Information given) No - Patient declined Yes (ED - Information included in AVS)  No - patient declined information    Current Medications (verified) Outpatient Encounter Medications as of 11/05/2021  Medication Sig   amLODipine (NORVASC) 2.5 MG tablet Take 2.5 mg by mouth daily.   aspirin EC 81 MG tablet Take 1 tablet (81 mg total) by mouth daily.   atorvastatin (LIPITOR) 40 MG tablet Take 1 tablet (40 mg total) by mouth daily.   metoprolol succinate (TOPROL-XL) 25 MG 24 hr tablet One a day (Patient taking differently: 12.5 mg. One a day)   tamsulosin (FLOMAX) 0.4 MG CAPS capsule Take 1 capsule (0.4 mg total) by mouth daily.   [DISCONTINUED] amLODipine (NORVASC) 5 MG tablet Take 1 tablet (5 mg total) by mouth daily. (Patient taking differently: Take 2.5 mg by mouth daily.)   SHINGRIX injection    No facility-administered encounter medications on file as of 11/05/2021.    Allergies (verified) Oxycodone hcl and  Sulfa antibiotics   History: Past Medical History:  Diagnosis Date   Atrial fibrillation (HCC)    BPH (benign prostatic hypertrophy)    Colon polyp    Coronary artery disease    Dysrhythmia    AFIB POST CABG / CARDIOVERSION   Gastric bleed    SURGERY   Gout    Hyperlipidemia    Hypertension    Peptic ulcer disease    Past Surgical History:  Procedure Laterality Date   APPENDECTOMY     CARDIAC CATHETERIZATION N/A 11/08/2014   Procedure: Left Heart Cath and Coronary Angiography;  Surgeon: DYolonda Kida MD;  [facility];  Service: Cardiovascular;  Laterality: N/A;   CARDIOVERSION     CATARACT EXTRACTION W/PHACO Right 06/01/2018   Procedure: CATARACT EXTRACTION PHACO AND INTRAOCULAR LENS PLACEMENT ([facility] RIGHT;  Surgeon: PBirder Robson MD;  Location: [facility];  Service: Ophthalmology;  Laterality: Right;  [place]00:50.5 CDE 7.01 Fluid Pack Lot # [id number]   CATARACT EXTRACTION W/PHACO Left 08/10/2018   Procedure: CATARACT EXTRACTION PHACO AND INTRAOCULAR LENS PLACEMENT ([place]  COMPLICATED LEFT MALYUGIN;  Surgeon: PBirder Robson MD;  [facility]  Service: Ophthalmology;  Laterality: Left;  MALYUGIN   COLONOSCOPY     COLONOSCOPY WITH PROPOFOL N/A 02/23/2017   Procedure: COLONOSCOPY WITH PROPOFOL;  Surgeon: SLollie Sails MD;  [facility];  Service: Endoscopy;  Laterality: N/A;  CORONARY ANGIOPLASTY     CORONARY ARTERY BYPASS GRAFT     ELECTROPHYSIOLOGIC STUDY N/A 01/30/2015   Procedure: Cardioversion;  Surgeon: Yolonda Kida, MD;  Location: [facility];  Service: Cardiovascular;  Laterality: N/A;   FRACTURE SURGERY Left    arm, knee, collar bone   FRACTURE SURGERY     GASTRIC RESECTION     partial   open heart surgery  12/05/2014   Family History  Problem Relation Age of Onset   Hypertension Brother    Social History   Socioeconomic History   Marital status: Widowed    Spouse name: Not on file   Number of  children: 2   Years of education: Not on file   Highest education level: Not on file  Occupational History   Not on file  Tobacco Use   Smoking status: Former    Types: Cigarettes    Quit date: 11/07/1984    Years since quitting: 37.0   Smokeless tobacco: Never  Vaping Use   Vaping Use: Never used  Substance and Sexual Activity   Alcohol use: Yes    Comment: less than 1 drink/week   Drug use: No   Sexual activity: Yes  Other Topics Concern   Not on file  Social History Narrative   Pt lives alone.    Social Determinants of Health   Financial Resource Strain: Low Risk  (07/30/2020)   Overall Financial Resource Strain (CARDIA)    Difficulty of Paying Living Expenses: Not hard at all  Food Insecurity: No Food Insecurity (07/30/2020)   Hunger Vital Sign    Worried About Running Out of Food in the Last Year: Never true    Barre in the Last Year: Never true  Transportation Needs: No Transportation Needs (07/30/2020)   PRAPARE - Hydrologist (Medical): No    Lack of Transportation (Non-Medical): No  Physical Activity: Insufficiently Active (07/30/2020)   Exercise Vital Sign    Days of Exercise per Week: 2 days    Minutes of Exercise per Session: 30 min  Stress: No Stress Concern Present (07/30/2020)   [place]    Feeling of Stress : Not at all  Social Connections: Socially Isolated (07/30/2020)   Social Connection and Isolation Panel [NHANES]    Frequency of Communication with Friends and Family: More than three times a week    Frequency of Social Gatherings with Friends and Family: Three times a week    Attends Religious Services: Never    Active Member of Clubs or Organizations: No    Attends Archivist Meetings: Never    Marital Status: Widowed    Tobacco Counseling Counseling given: Not Answered  No counseling needed  Clinical Intake:     Pain : 0-10 Pain  Score: 0-No pain     Nutritional Risks: None Diabetes: No  How often do you need to have someone help you when you read instructions, pamphlets, or other written materials from your doctor or pharmacy?: 1 - Never  Diabetic?no     Information entered by :: Donnie Mesa, CMA   Activities of Daily Living    11/05/2021    1:54 PM  In your present state of health, do you have any difficulty performing the following activities:  Hearing? 0  Vision? 0  Comment cataract surgery  Difficulty concentrating or making decisions? 0  Walking or climbing stairs? 0  Dressing or bathing?  0  Doing errands, shopping? 0    Patient Care Team: Juline Patch, MD as PCP - General (Family Medicine) Yolonda Kida, MD as Consulting Physician (Cardiology) Ree Edman, MD (Dermatology) Birder Robson, MD as Referring Physician (Ophthalmology)  Indicate any recent Medical Services you may have received from other than [facility] providers in the past year (date may be approximate).    Pt seen at [facility] on 10/19/2021 for a skin abrasion to his Rt forearm. Assessment:   This is a routine wellness examination for [place].  Hearing/Vision screen Pt denies hearing difficulty  Vision Screening:Annual vision screenings done at [place] issues and exercise activities discussed: Current Exercise Habits: Structured exercise class, Type of exercise: walking, Time (Minutes): 60, Frequency (Times/Week): 2, Weekly Exercise (Minutes/Week): 120, Intensity: Moderate, Exercise limited by: orthopedic condition(s)   Goals Addressed   None    Depression Screen    11/05/2021    2:17 PM 10/21/2021    4:02 PM 05/23/2021    7:53 AM 11/23/2020    8:02 AM 07/30/2020   11:26 AM 06/07/2020    8:07 AM 03/08/2020   11:06 AM  PHQ 2/9 Scores  PHQ - 2 Score 0 0 0 0 0 0 0  PHQ- 9 Score '1 1 1 '$ 0  0 0    Fall Risk    11/05/2021    1:53 PM 10/21/2021    4:03 PM 11/23/2020    8:02 AM  07/30/2020   11:28 AM 03/08/2020   11:06 AM  Fall Risk   Falls in the past year? 0 0 0 0 0  Number falls in past yr: 0  0 0   Injury with Fall? 0 0 0 0   Risk for fall due to : No Fall Risks  No Fall Risks No Fall Risks   Follow up Falls evaluation completed  Falls evaluation completed Falls prevention discussed Falls evaluation completed    [place]:  Any stairs in or around the home? No  If so, are there any without handrails? No  Home free of loose throw rugs in walkways, pet beds, electrical cords, etc? Yes  Adequate lighting in your home to reduce risk of falls? Yes   ASSISTIVE DEVICES UTILIZED TO PREVENT FALLS:  Life alert? No  Use of a cane, walker or w/c? No  Grab bars in the bathroom? No  Shower chair or bench in shower? Yes  Elevated toilet seat or a handicapped toilet? Yes     DME ORDERS:   DME order needed?  No   TIMED UP AND GO:  Was the test performed? Yes .  Length of time to ambulate 10 feet: 10 sec.   Gait slow and steady without use of assistive device  Cognitive Function:        11/05/2021    2:01 PM 07/30/2020   11:29 AM 07/27/2019   11:30 AM  6CIT Screen  What Year? 0 points 0 points 0 points  What month? 0 points 0 points 0 points  What time? 0 points 0 points 0 points  Count back from 20 0 points 0 points 0 points  Months in reverse 0 points 0 points 4 points  Repeat phrase 0 points 2 points 0 points  Total Score 0 points 2 points 4 points    Immunizations Immunization History  Administered Date(s) Administered   Fluad Quad(high Dose 65+) 12/20/2019, 11/23/2020   Influenza, High Dose Seasonal PF  12/24/2016, 12/25/2017, 11/15/2018   Influenza,inj,Quad PF,6+ Mos 11/22/2014   Influenza-Unspecified 11/23/2014, 11/15/2018   PFIZER(Purple Top)SARS-COV-2 Vaccination 03/31/2019, 04/21/2019, 01/30/2020   Pneumococcal Conjugate-13 12/31/2017   Pneumococcal Polysaccharide-23 01/31/2019   Td 10/19/2021   Zoster  Recombinat (Shingrix) 12/04/2020, 05/29/2021    TDAP status: Up to date  Flu Vaccine status: Up to date  Pneumococcal vaccine status: Up to date  Covid-19 vaccine status: Completed vaccines  Qualifies for Shingles Vaccine? Yes   Zostavax completed No   Shingrix Completed?: No.    Education has been provided regarding the importance of this vaccine. Patient has been advised to call insurance company to determine out of pocket expense if they have not yet received this vaccine. Advised may also receive vaccine at local pharmacy or Health Dept. Verbalized acceptance and understanding.  Screening Tests Health Maintenance  Topic Date Due   COVID-19 Vaccine (4 - Pfizer series) 03/26/2020   INFLUENZA VACCINE  10/22/2021   TETANUS/TDAP  10/20/2031   Pneumonia Vaccine 102+ Years old  Completed   Zoster Vaccines- Shingrix  Completed   HPV VACCINES  Aged Out    Health Maintenance  Health Maintenance Due  Topic Date Due   COVID-19 Vaccine (4 - Pfizer series) 03/26/2020   INFLUENZA VACCINE  10/22/2021    Colorectal cancer screening: No longer required.   Lung Cancer Screening: (Low Dose CT Chest recommended if Age 44-80 years, 30 pack-year currently smoking OR have quit w/in 15years.) does not qualify.   Lung Cancer Screening Referral: does not qualify   Additional Screening:  Hepatitis C Screening: does not qualify;   Vision Screening: Recommended annual ophthalmology exams for early detection of glaucoma and other disorders of the eye. Is the patient up to date with their annual eye exam?  Yes  Who is the provider or what is the name of the office in which the patient attends annual eye exams? Yes If pt is not established with a provider, would they like to be referred to a provider to establish care? Marland Kitchen  No  Dental Screening: Recommended annual dental exams for proper oral hygiene  Community Resource Referral / Chronic Care Management: CRR required this visit?  No   CCM  required this visit?  No      Plan:     I have personally reviewed and noted the following in the patient's chart:   Medical and social history Use of alcohol, tobacco or illicit drugs  Current medications and supplements including opioid prescriptions. Patient is not currently taking opioid prescriptions. Functional ability and status Nutritional status Physical activity Advanced directives List of other physicians Hospitalizations, surgeries, and ER visits in previous 12 months Vitals Screenings to include cognitive, depression, and falls Referrals and appointments  In addition, I have reviewed and discussed with patient certain preventive protocols, quality metrics, and best practice recommendations. A written personalized care plan for preventive services as well as general preventive health recommendations were provided to patient.     Mr. Chiang , Thank you for taking time to come for your Medicare Wellness Visit. I appreciate your ongoing commitment to your health goals. Please review the following plan we discussed and let me know if I can assist you in the future.   These are the goals we discussed:  Goals      DIET - INCREASE WATER INTAKE     Recommend drinking 6-8 glasses of water per day        This is a list of the screening recommended  for you and due dates:  Health Maintenance  Topic Date Due   COVID-19 Vaccine (4 - Pfizer series) 03/26/2020   Flu Shot  10/22/2021   Tetanus Vaccine  10/20/2031   Pneumonia Vaccine  Completed   Zoster (Shingles) Vaccine  Completed   HPV Vaccine  Aged [address] [place]   11/05/2021   Nurse Notes: Approximately 40 minute Face-to-face Visit

## 2021-11-11 DIAGNOSIS — G8929 Other chronic pain: Secondary | ICD-10-CM | POA: Diagnosis not present

## 2021-11-11 DIAGNOSIS — M25811 Other specified joint disorders, right shoulder: Secondary | ICD-10-CM | POA: Diagnosis not present

## 2021-11-26 ENCOUNTER — Ambulatory Visit (INDEPENDENT_AMBULATORY_CARE_PROVIDER_SITE_OTHER): Payer: Medicare HMO | Admitting: Family Medicine

## 2021-11-26 ENCOUNTER — Encounter: Payer: Self-pay | Admitting: Family Medicine

## 2021-11-26 VITALS — BP 130/80 | HR 80 | Ht 67.0 in | Wt 158.0 lb

## 2021-11-26 DIAGNOSIS — N401 Enlarged prostate with lower urinary tract symptoms: Secondary | ICD-10-CM | POA: Diagnosis not present

## 2021-11-26 DIAGNOSIS — I10 Essential (primary) hypertension: Secondary | ICD-10-CM

## 2021-11-26 DIAGNOSIS — E782 Mixed hyperlipidemia: Secondary | ICD-10-CM | POA: Diagnosis not present

## 2021-11-26 DIAGNOSIS — Z23 Encounter for immunization: Secondary | ICD-10-CM | POA: Diagnosis not present

## 2021-11-26 MED ORDER — METOPROLOL SUCCINATE ER 25 MG PO TB24: 12.5000 mg | ORAL_TABLET | Freq: Every day | ORAL | 1 refills | Status: DC

## 2021-11-26 MED ORDER — ATORVASTATIN CALCIUM 40 MG PO TABS: 40.0000 mg | ORAL_TABLET | Freq: Every day | ORAL | 1 refills | Status: DC

## 2021-11-26 MED ORDER — TAMSULOSIN HCL 0.4 MG PO CAPS: 0.4000 mg | ORAL_CAPSULE | Freq: Every day | ORAL | 1 refills | Status: DC

## 2021-11-26 NOTE — Progress Notes (Signed)
Date:  11/26/2021   Name:  Stuart Jenkins   DOB:  04-03-1940   MRN:  [id number]   Chief Complaint: Hyperlipidemia, Hypertension, Benign Prostatic Hypertrophy, and Flu Vaccine  Hyperlipidemia This is a chronic problem. The current episode started more than 1 year ago. The problem is controlled. Recent lipid tests were reviewed and are normal. He has no history of chronic renal disease, diabetes, hypothyroidism, liver disease, obesity or nephrotic syndrome. There are no known factors aggravating his hyperlipidemia. Pertinent negatives include no chest pain or shortness of breath. Current antihyperlipidemic treatment includes statins. The current treatment provides moderate improvement of lipids. There are no compliance problems.  Risk factors for coronary artery disease include dyslipidemia.  Hypertension This is a chronic problem. The current episode started more than 1 year ago. The problem has been gradually improving since onset. The problem is controlled. Pertinent negatives include no anxiety, blurred vision, chest pain, headaches, malaise/fatigue, neck pain, orthopnea, palpitations, peripheral edema, PND, shortness of breath or sweats. There are no associated agents to hypertension. Past treatments include beta blockers. The current treatment provides moderate improvement. There is no history of angina, kidney disease, CAD/MI, CVA, heart failure, left ventricular hypertrophy, PVD or retinopathy. There is no history of chronic renal disease, a hypertension causing med or renovascular disease.  Benign Prostatic Hypertrophy This is a chronic problem. The current episode started more than 1 year ago. The problem has been gradually improving since onset. Irritative symptoms include nocturia. Irritative symptoms do not include frequency or urgency. Obstructive symptoms do not include incomplete emptying, an intermittent stream or a weak stream. Pertinent negatives include no hesitancy. Past  treatments include tamsulosin. The treatment provided moderate relief.    Lab Results  Component Value Date   NA 139 11/23/2020   K 4.5 11/23/2020   CO2 24 11/23/2020   GLUCOSE 90 11/23/2020   BUN 12 11/23/2020   CREATININE 1.11 11/23/2020   CALCIUM 9.4 11/23/2020   EGFR 68 11/23/2020   GFRNONAA 69 12/20/2019   Lab Results  Component Value Date   CHOL 86 (L) 11/23/2020   HDL 29 (L) 11/23/2020   LDLCALC 35 11/23/2020   TRIG 122 11/23/2020   CHOLHDL 3.1 01/31/2019   No results found for: "TSH" Lab Results  Component Value Date   HGBA1C 5.5 12/24/2014   Lab Results  Component Value Date   WBC 4.3 12/23/2014   HGB 9.9 (L) 12/24/2014   HCT 31.7 (L) 12/23/2014   MCV 90.5 12/23/2014   PLT 384 12/23/2014   Lab Results  Component Value Date   ALT 45 (H) 11/23/2020   AST 46 (H) 11/23/2020   ALKPHOS 104 11/23/2020   BILITOT 0.5 11/23/2020   No results found for: "25OHVITD2", "25OHVITD3", "VD25OH"   Review of Systems  Constitutional:  Negative for fatigue and malaise/fatigue.  HENT:  Negative for trouble swallowing.   Eyes:  Negative for blurred vision and visual disturbance.  Respiratory:  Negative for chest tightness and shortness of breath.   Cardiovascular:  Negative for chest pain, palpitations, orthopnea, leg swelling and PND.  Gastrointestinal:  Negative for abdominal pain and blood in stool.  Genitourinary:  Positive for nocturia. Negative for frequency, hesitancy, incomplete emptying and urgency.  Musculoskeletal:  Negative for back pain and neck pain.  Neurological:  Negative for headaches.  Hematological:  Negative for adenopathy.    Patient Active Problem List   Diagnosis Date Noted   Infected abrasion of skin of right forearm 10/22/2021  Pain in limb 02/12/2018   Coronary artery disease involving coronary bypass graft of native heart without angina pectoris 07/30/2017   Mixed hyperlipidemia 09/11/2016   Benign prostatic hyperplasia with lower urinary  tract symptoms 09/11/2016   Hematospermia 07/10/2015   Essential hypertension 06/12/2015   Cervical disc disorder 04/10/2015   Hypotension 12/24/2014   Hyponatremia 12/24/2014   Anemia 12/24/2014   Hyperglycemia 12/24/2014   Syncopal episodes 12/23/2014   Acute postoperative pain 12/07/2014   Cigarette smoker 12/06/2014   Primary localized osteoarthrosis, hand 11/17/2013   Chronic prostatitis 03/09/2012    Allergies  Allergen Reactions   Oxycodone Hcl Other (See Comments)    Altered mental status   Sulfa Antibiotics Rash    Past Surgical History:  Procedure Laterality Date   APPENDECTOMY     CARDIAC CATHETERIZATION N/A 11/08/2014   Procedure: Left Heart Cath and Coronary Angiography;  Surgeon: Yolonda Kida, MD;  Location: [place] [facility];  Service: Cardiovascular;  Laterality: N/A;   CARDIOVERSION     CATARACT EXTRACTION W/PHACO Right 06/01/2018   Procedure: CATARACT EXTRACTION PHACO AND INTRAOCULAR LENS PLACEMENT ([place]) RIGHT;  Surgeon: Birder Robson, MD;  Location: [facility];  Service: Ophthalmology;  Laterality: Right;  [place] 00:50.5 CDE 7.01 Fluid Pack Lot # [id number]   CATARACT EXTRACTION W/PHACO Left 08/10/2018   Procedure: CATARACT EXTRACTION PHACO AND INTRAOCULAR LENS PLACEMENT ([place])  COMPLICATED LEFT MALYUGIN;  Surgeon: Birder Robson, MD;  Location: [place];  Service: Ophthalmology;  Laterality: Left;  MALYUGIN   COLONOSCOPY     COLONOSCOPY WITH PROPOFOL N/A 02/23/2017   Procedure: COLONOSCOPY WITH PROPOFOL;  Surgeon: Lollie Sails, MD;  Location: [facility];  Service: Endoscopy;  Laterality: N/A;   CORONARY ANGIOPLASTY     CORONARY ARTERY BYPASS GRAFT     ELECTROPHYSIOLOGIC STUDY N/A 01/30/2015   Procedure: Cardioversion;  Surgeon: Yolonda Kida, MD;  Location: [facility];  Service: Cardiovascular;  Laterality: N/A;   FRACTURE SURGERY Left    arm, knee, collar bone   FRACTURE SURGERY     GASTRIC RESECTION     partial   open heart  surgery  12/05/2014    Social History   Tobacco Use   Smoking status: Former    Types: Cigarettes    Quit date: 11/07/1984    Years since quitting: 37.0   Smokeless tobacco: Never  Vaping Use   Vaping Use: Never used  Substance Use Topics   Alcohol use: Yes    Comment: less than 1 drink/week   Drug use: No     Medication list has been reviewed and updated.  Current Meds  Medication Sig   amLODipine (NORVASC) 2.5 MG tablet Take 2.5 mg by mouth daily.   aspirin EC 81 MG tablet Take 1 tablet (81 mg total) by mouth daily.   atorvastatin (LIPITOR) 40 MG tablet Take 1 tablet (40 mg total) by mouth daily.   metoprolol succinate (TOPROL-XL) 25 MG 24 hr tablet One a day (Patient taking differently: 12.5 mg. One a day)   SHINGRIX injection    tamsulosin (FLOMAX) 0.4 MG CAPS capsule Take 1 capsule (0.4 mg total) by mouth daily.       11/26/2021    8:07 AM 11/05/2021    2:18 PM 10/21/2021    4:02 PM 05/23/2021    7:54 AM  GAD 7 : Generalized Anxiety Score  Nervous, Anxious, on Edge 0 0 0 0  Control/stop worrying 0 0 0 0  Worry too much - different things  0 0 0 0  Trouble relaxing 0 0 0 0  Restless 0 0 0 0  Easily annoyed or irritable 0 0 0 0  Afraid - awful might happen 0 0 0 0  Total GAD 7 Score 0 0 0 0  Anxiety Difficulty Not difficult at all Not difficult at all  Not difficult at all       11/26/2021    8:07 AM 11/05/2021    2:17 PM 10/21/2021    4:02 PM  Depression screen PHQ 2/9  Decreased Interest 0 0 0  Down, Depressed, Hopeless 0 0 0  PHQ - 2 Score 0 0 0  Altered sleeping 0 1 1  Tired, decreased energy 0 0 0  Change in appetite 0 0 0  Feeling bad or failure about yourself  0 0 0  Trouble concentrating 0 0 0  Moving slowly or fidgety/restless 0 0 0  Suicidal thoughts 0 0 0  PHQ-9 Score 0 1 1  Difficult doing work/chores Not difficult at all Not difficult at all Not difficult at all    BP Readings from Last 3 Encounters:  11/26/21 130/80  11/05/21 138/82   10/21/21 130/82    Physical Exam Vitals and nursing note reviewed.  HENT:     Head: Normocephalic.     Right Ear: Tympanic membrane and external ear normal.     Left Ear: Tympanic membrane and external ear normal.     Nose: Nose normal.     Mouth/Throat:     Mouth: Mucous membranes are moist.  Eyes:     General: No scleral icterus.       Right eye: No discharge.        Left eye: No discharge.     Conjunctiva/sclera: Conjunctivae normal.     Pupils: Pupils are equal, round, and reactive to light.  Neck:     Thyroid: No thyromegaly.     Vascular: No JVD.     Trachea: No tracheal deviation.  Cardiovascular:     Rate and Rhythm: Normal rate and regular rhythm.     Heart sounds: Normal heart sounds, S1 normal and S2 normal. No murmur heard.    No systolic murmur is present.     No diastolic murmur is present.     No friction rub. No gallop. No S3 or S4 sounds.  Pulmonary:     Effort: No respiratory distress.     Breath sounds: Normal breath sounds. No wheezing, rhonchi or rales.  Abdominal:     General: Bowel sounds are normal.     Palpations: Abdomen is soft. There is no mass.     Tenderness: There is no abdominal tenderness. There is no guarding or rebound.  Genitourinary:    Prostate: Normal. Not enlarged, not tender and no nodules present.     Rectum: Normal. Guaiac result negative. No mass.  Musculoskeletal:        General: No tenderness. Normal range of motion.     Cervical back: Normal range of motion and neck supple.  Lymphadenopathy:     Cervical: No cervical adenopathy.  Skin:    General: Skin is warm.     Findings: No rash.  Neurological:     Mental Status: He is alert and oriented to person, place, and time.     Cranial Nerves: No cranial nerve deficit.     Deep Tendon Reflexes: Reflexes are normal and symmetric.     Wt Readings from Last 3 Encounters:  11/26/21 158  lb (71.7 kg)  11/05/21 163 lb (73.9 kg)  10/21/21 160 lb 12.8 oz (72.9 kg)    BP  130/80   Pulse 80   Ht _0  (1.702 m)   Wt 158 lb (71.7 kg)   BMI 24.75 kg/m   Assessment and Plan:  1. Essential hypertension Chronic.  Controlled.  130/80.  Continue metoprolol XL 25 mg 1/2 tablet daily.  NP for electrolytes and GFR. - metoprolol succinate (TOPROL-XL) 25 MG 24 hr tablet; Take 0.5 tablets (12.5 mg total) by mouth daily. One a day  Dispense: 90 tablet; Refill: 1 - Comprehensive Metabolic Panel (CMET)  2. Mixed hyperlipidemia Chronic.  Controlled.  Stable.  Continue atorvastatin 40 mg daily.  We will check lipid panel for current level - atorvastatin (LIPITOR) 40 MG tablet; Take 1 tablet (40 mg total) by mouth daily.  Dispense: 90 tablet; Refill: 1 - Lipid Panel With LDL/HDL Ratio  3. Benign prostatic hyperplasia with lower urinary tract symptoms, symptom details unspecified Chronic.  Controlled.  Stable.  Asymptomatic.  Except for nocturia.  Continue tamsulosin 0.4 mg once a day.  We will check PSA.  DRE was normal for size shape and - tamsulosin (FLOMAX) 0.4 MG CAPS capsule; Take 1 capsule (0.4 mg total) by mouth daily.  Dispense: 90 capsule; Refill: 1 - PSA  4. Need for immunization against influenza Discussed and administered - Flu Vaccine QUAD 14moIM (Fluarix, Fluzone & Alfiuria Quad PF)    DOtilio Miu MD

## 2021-11-27 LAB — LIPID PANEL WITH LDL/HDL RATIO
Cholesterol, Total: 108 mg/dL (ref 100–199)
HDL: 39 mg/dL — ABNORMAL LOW (ref >39)
LDL Chol Calc (NIH): 43 mg/dL (ref 0–99)
LDL/HDL Ratio: 1.1 ratio (ref 0.0–3.6)
Triglycerides: 155 mg/dL — ABNORMAL HIGH (ref 0–149)
VLDL Cholesterol Cal: 26 mg/dL (ref 5–40)

## 2021-11-27 LAB — COMPREHENSIVE METABOLIC PANEL
ALT: 58 IU/L — ABNORMAL HIGH (ref 0–44)
AST: 43 IU/L — ABNORMAL HIGH (ref 0–40)
Albumin/Globulin Ratio: 1.5 (ref 1.2–2.2)
Albumin: 4.3 g/dL (ref 3.8–4.8)
Alkaline Phosphatase: 115 IU/L (ref 44–121)
BUN/Creatinine Ratio: 11 (ref 10–24)
BUN: 12 mg/dL (ref 8–27)
Bilirubin Total: 0.6 mg/dL (ref 0.0–1.2)
CO2: 22 mmol/L (ref 20–29)
Calcium: 9.7 mg/dL (ref 8.6–10.2)
Chloride: 102 mmol/L (ref 96–106)
Creatinine, Ser: 1.06 mg/dL (ref 0.76–1.27)
Globulin, Total: 2.8 g/dL (ref 1.5–4.5)
Glucose: 96 mg/dL (ref 70–99)
Potassium: 4.2 mmol/L (ref 3.5–5.2)
Sodium: 139 mmol/L (ref 134–144)
Total Protein: 7.1 g/dL (ref 6.0–8.5)
eGFR: 71 mL/min/{1.73_m2} (ref >59)

## 2021-11-27 LAB — PSA: Prostate Specific Ag, Serum: 1.2 ng/mL (ref 0.0–4.0)

## 2022-01-02 ENCOUNTER — Other Ambulatory Visit: Payer: Self-pay

## 2022-01-02 ENCOUNTER — Ambulatory Visit
Admission: EM | Admit: 2022-01-02 | Discharge: 2022-01-02 | Disposition: A | Discharge status: Home / Self Care | Payer: Medicare HMO | Attending: Emergency Medicine | Admitting: Emergency Medicine

## 2022-01-02 DIAGNOSIS — L03113 Cellulitis of right upper limb: Secondary | ICD-10-CM

## 2022-01-02 MED ORDER — DOXYCYCLINE HYCLATE 100 MG PO CAPS: 100.0000 mg | ORAL_CAPSULE | Freq: Two times a day (BID) | ORAL | 0 refills | Status: DC

## 2022-01-02 NOTE — Discharge Instructions (Signed)
Take the Doxycycline twice daily with food for 10 days.  Doxycycline will make you more sensitive to sunburn so wear sunscreen when outdoors and reapply it every 90 minutes.  Apply warm compresses to help promote resolution of the infection.  Use OTC Tylenol and Ibuprofen according to the package instructions as needed for pain.  Return for new or worsening symptoms.  If the redness increases, the red streaks track further up your arm, the swelling increases, or you develop a fever you need to go to the ER.

## 2022-01-02 NOTE — ED Provider Notes (Signed)
[facility]    CSN: [id number] Arrival date & time: 01/02/22  1919      History   Chief Complaint Chief Complaint  Patient presents with   Insect Bite   Cellulitis    HPI Stuart Jenkins is a 81 y.o. male.   HPI  81 year old male here for evaluation of redness, swelling, and red streaks on his right arm.  Patient reports that he was bitten on his right forearm yesterday by fire ants and then today he noticed redness, swelling, and red streaks going up his arm.  He denies any drainage or fever.  He has no numbness or tingling in his fingers.  Past Medical History:  Diagnosis Date   Atrial fibrillation (HCC)    BPH (benign prostatic hypertrophy)    Colon polyp    Coronary artery disease    Dysrhythmia    AFIB POST CABG / CARDIOVERSION   Gastric bleed    SURGERY   Gout    Hyperlipidemia    Hypertension    Peptic ulcer disease     Patient Active Problem List   Diagnosis Date Noted   Infected abrasion of skin of right forearm 10/22/2021   Pain in limb 02/12/2018   Coronary artery disease involving coronary bypass graft of native heart without angina pectoris 07/30/2017   Mixed hyperlipidemia 09/11/2016   Benign prostatic hyperplasia with lower urinary tract symptoms 09/11/2016   Hematospermia 07/10/2015   Essential hypertension 06/12/2015   Cervical disc disorder 04/10/2015   Hypotension 12/24/2014   Hyponatremia 12/24/2014   Anemia 12/24/2014   Hyperglycemia 12/24/2014   Syncopal episodes 12/23/2014   Acute postoperative pain 12/07/2014   Cigarette smoker 12/06/2014   Primary localized osteoarthrosis, hand 11/17/2013   Chronic prostatitis 03/09/2012    Past Surgical History:  Procedure Laterality Date   APPENDECTOMY     CARDIAC CATHETERIZATION N/A 11/08/2014   Procedure: Left Heart Cath and Coronary Angiography;  Surgeon: Yolonda Kida, MD;  Location: [place] [facility];  Service: Cardiovascular;  Laterality: N/A;   CARDIOVERSION      CATARACT EXTRACTION W/PHACO Right 06/01/2018   Procedure: CATARACT EXTRACTION PHACO AND INTRAOCULAR LENS PLACEMENT ([place]) RIGHT;  Surgeon: Birder Robson, MD;  Location: [facility];  Service: Ophthalmology;  Laterality: Right;  [place] 00:50.5 CDE 7.01 Fluid Pack Lot # [id number]   CATARACT EXTRACTION W/PHACO Left 08/10/2018   Procedure: CATARACT EXTRACTION PHACO AND INTRAOCULAR LENS PLACEMENT ([place])  COMPLICATED LEFT MALYUGIN;  Surgeon: Birder Robson, MD;  Location: [place];  Service: Ophthalmology;  Laterality: Left;  MALYUGIN   COLONOSCOPY     COLONOSCOPY WITH PROPOFOL N/A 02/23/2017   Procedure: COLONOSCOPY WITH PROPOFOL;  Surgeon: Lollie Sails, MD;  [facility];  Service: Endoscopy;  Laterality: N/A;   CORONARY ANGIOPLASTY     CORONARY ARTERY BYPASS GRAFT     ELECTROPHYSIOLOGIC STUDY N/A 01/30/2015   Procedure: Cardioversion;  Surgeon: Yolonda Kida, MD;  Location: [facility];  Service: Cardiovascular;  Laterality: N/A;   FRACTURE SURGERY Left    arm, knee, collar bone   FRACTURE SURGERY     GASTRIC RESECTION     partial   open heart surgery  12/05/2014       Home Medications    Prior to Admission medications   Medication Sig Start Date End Date Taking? Authorizing Provider  doxycycline (VIBRAMYCIN) 100 MG capsule Take 1 capsule (100 mg total) by mouth 2 (two) times daily. 01/02/22  Yes Margarette Canada, NP  amLODipine (  NORVASC) 2.5 MG tablet Take 2.5 mg by mouth daily. 10/30/21   [provider]  aspirin EC 81 MG tablet Take 1 tablet (81 mg total) by mouth daily. 07/30/17   Juline Patch, MD  atorvastatin (LIPITOR) 40 MG tablet Take 1 tablet (40 mg total) by mouth daily. 11/26/21   Juline Patch, MD  metoprolol succinate (TOPROL-XL) 25 MG 24 hr tablet Take 0.5 tablets (12.5 mg total) by mouth daily. One a day 11/26/21   Juline Patch, MD  [facility] injection  05/29/21   [provider]  tamsulosin (FLOMAX) 0.4 MG CAPS capsule Take 1 capsule  (0.4 mg total) by mouth daily. 11/26/21   Juline Patch, MD    Family History Family History  Problem Relation Age of Onset   Hypertension Brother     Social History Social History   Tobacco Use   Smoking status: Former    Types: Cigarettes    Quit date: 11/07/1984    Years since quitting: 37.1   Smokeless tobacco: Never  Vaping Use   Vaping Use: Never used  Substance Use Topics   Alcohol use: Not Currently    Comment: less than 1 drink/week   Drug use: No     Allergies   Oxycodone hcl and Sulfa antibiotics   Review of Systems Review of Systems  Constitutional:  Negative for fever.  Skin:  Positive for color change. Negative for rash and wound.  Neurological:  Negative for numbness.  Hematological: Negative.   Psychiatric/Behavioral: Negative.       Physical Exam Triage Vital Signs ED Triage Vitals  Enc Vitals Group     BP 01/02/22 2009 (!) 155/92     Pulse Rate 01/02/22 2009 66     Resp 01/02/22 2009 17     Temp 01/02/22 2009 98.7 F (37.1 C)     Temp Source 01/02/22 2009 Oral     SpO2 01/02/22 2009 95 %     Weight 01/02/22 2005 165 lb (74.8 kg)     Height 01/02/22 2005 '5\' 10"'$  (1.778 m)     Head Circumference --      Peak Flow --      Pain Score 01/02/22 2004 6     Pain Loc --      Pain Edu? --      Excl. in [place]? --    No data found.  Updated Vital Signs BP (!) 155/92 (BP Location: Left Arm) Comment: did not take b/p med today  Pulse 66   Temp 98.7 F (37.1 C) (Oral)   Resp 17   Ht '5\' 10"'$  (1.778 m)   Wt 165 lb (74.8 kg)   SpO2 95%   BMI 23.68 kg/m   Visual Acuity Right Eye Distance:   Left Eye Distance:   Bilateral Distance:    Right Eye Near:   Left Eye Near:    Bilateral Near:     Physical Exam Vitals and nursing note reviewed.  Constitutional:      Appearance: Normal appearance. He is not ill-appearing.  HENT:     Head: Normocephalic and atraumatic.  Musculoskeletal:        General: Swelling and signs of injury present. No  tenderness.  Skin:    General: Skin is warm and dry.     Capillary Refill: Capillary refill takes less than 2 seconds.     Findings: Erythema present.  Neurological:     General: No focal deficit present.  Mental Status: He is alert and oriented to person, place, and time.  Psychiatric:        Mood and Affect: Mood normal.        Behavior: Behavior normal.        Thought Content: Thought content normal.        Judgment: Judgment normal.      UC Treatments / Results  Labs (all labs ordered are listed, but only abnormal results are displayed) Labs Reviewed - No data to display  EKG   Radiology No results found.  Procedures Procedures (including critical care time)  Medications Ordered in UC Medications - No data to display  Initial Impression / Assessment and Plan / UC Course  I have reviewed the triage vital signs and the nursing notes.  Pertinent labs & imaging results that were available during my care of the patient were reviewed by me and considered in my medical decision making (see chart for details).   Patient is a very pleasant, nontoxic-appearing 81 year old male here for evaluation of redness and swelling to the volar aspect of his right forearm with red cheeks tracking up to the middle of his bicep.  The redness does extend medially and posteriorly around to the dorsal aspect of the forearm as well.      The red areas are not particularly tender to touch but they are warm.  There is no fluctuance or induration noted.  Cap refill in his fingers are 2 seconds and his radius and ulnar pulses are 2+.  The patient's hand is consistent with cellulitis.  He has an allergy to Bactrim so I will discharge him home on Bactrim twice daily for 10 days.  I have also advised him that if the redness worsens, the rupture is continuing up his arm, if the swelling worsens, or he starts to develop fevers he is to go to the ER for evaluation.  Patient verbalized understanding of  same.   Final Clinical Impressions(s) / UC Diagnoses   Final diagnoses:  Cellulitis of right upper extremity     Discharge Instructions      Take the Doxycycline twice daily with food for 10 days.  Doxycycline will make you more sensitive to sunburn so wear sunscreen when outdoors and reapply it every 90 minutes.  Apply warm compresses to help promote resolution of the infection.  Use OTC Tylenol and Ibuprofen according to the package instructions as needed for pain.  Return for new or worsening symptoms.  If the redness increases, the red streaks track further up your arm, the swelling increases, or you develop a fever you need to go to the ER.     ED Prescriptions     Medication Sig Dispense Auth. Provider   doxycycline (VIBRAMYCIN) 100 MG capsule Take 1 capsule (100 mg total) by mouth 2 (two) times daily. 20 capsule Margarette Canada, NP      PDMP not reviewed this encounter.   Margarette Canada, NP 01/02/22 2022

## 2022-01-02 NOTE — ED Triage Notes (Signed)
Pt with right arm redness, puffiness, and red streak going up arm after being bit by many fire ants yesterday

## 2022-02-04 ENCOUNTER — Telehealth: Payer: Self-pay | Admitting: Family Medicine

## 2022-02-04 ENCOUNTER — Other Ambulatory Visit: Payer: Self-pay

## 2022-02-04 DIAGNOSIS — R7989 Other specified abnormal findings of blood chemistry: Secondary | ICD-10-CM

## 2022-02-04 NOTE — Telephone Encounter (Signed)
Copied from [place] [phone]. Topic: Appointment Scheduling - Scheduling Inquiry for Clinic >> Feb 04, 2022  9:16 AM Erskine Squibb wrote: Reason for CRM: The patient called in stating Baxter Flattery told her to call in to schedule lab work. There is no order in the system yet that I see so after the order is put in can you please schedule him for follow up lab work. He wanted to know if he can come in tomorrow to get it done? Please assist patient further.

## 2022-02-04 NOTE — Progress Notes (Signed)
Liver function ordered

## 2022-02-05 DIAGNOSIS — R7989 Other specified abnormal findings of blood chemistry: Secondary | ICD-10-CM | POA: Diagnosis not present

## 2022-02-06 LAB — HEPATIC FUNCTION PANEL
ALT: 47 IU/L — ABNORMAL HIGH (ref 0–44)
AST: 42 IU/L — ABNORMAL HIGH (ref 0–40)
Albumin: 4.4 g/dL (ref 3.7–4.7)
Alkaline Phosphatase: 111 IU/L (ref 44–121)
Bilirubin Total: 0.6 mg/dL (ref 0.0–1.2)
Bilirubin, Direct: 0.17 mg/dL (ref 0.00–0.40)
Total Protein: 7.1 g/dL (ref 6.0–8.5)

## 2022-02-07 DIAGNOSIS — I10 Essential (primary) hypertension: Secondary | ICD-10-CM | POA: Diagnosis not present

## 2022-02-07 DIAGNOSIS — Z951 Presence of aortocoronary bypass graft: Secondary | ICD-10-CM | POA: Diagnosis not present

## 2022-02-07 DIAGNOSIS — Z720 Tobacco use: Secondary | ICD-10-CM | POA: Diagnosis not present

## 2022-02-07 DIAGNOSIS — I959 Hypotension, unspecified: Secondary | ICD-10-CM | POA: Diagnosis not present

## 2022-02-07 DIAGNOSIS — K279 Peptic ulcer, site unspecified, unspecified as acute or chronic, without hemorrhage or perforation: Secondary | ICD-10-CM | POA: Diagnosis not present

## 2022-02-07 DIAGNOSIS — Z87891 Personal history of nicotine dependence: Secondary | ICD-10-CM | POA: Diagnosis not present

## 2022-02-07 DIAGNOSIS — I2510 Atherosclerotic heart disease of native coronary artery without angina pectoris: Secondary | ICD-10-CM | POA: Diagnosis not present

## 2022-02-07 DIAGNOSIS — R001 Bradycardia, unspecified: Secondary | ICD-10-CM | POA: Diagnosis not present

## 2022-02-07 DIAGNOSIS — E782 Mixed hyperlipidemia: Secondary | ICD-10-CM | POA: Diagnosis not present

## 2022-05-27 ENCOUNTER — Encounter: Payer: Self-pay | Admitting: Family Medicine

## 2022-05-27 ENCOUNTER — Ambulatory Visit (INDEPENDENT_AMBULATORY_CARE_PROVIDER_SITE_OTHER): Payer: Medicare HMO | Admitting: Family Medicine

## 2022-05-27 VITALS — BP 136/88 | HR 64 | Ht 70.0 in | Wt 159.0 lb

## 2022-05-27 DIAGNOSIS — L089 Local infection of the skin and subcutaneous tissue, unspecified: Secondary | ICD-10-CM

## 2022-05-27 DIAGNOSIS — B351 Tinea unguium: Secondary | ICD-10-CM

## 2022-05-27 DIAGNOSIS — N401 Enlarged prostate with lower urinary tract symptoms: Secondary | ICD-10-CM

## 2022-05-27 DIAGNOSIS — I10 Essential (primary) hypertension: Secondary | ICD-10-CM | POA: Diagnosis not present

## 2022-05-27 DIAGNOSIS — E782 Mixed hyperlipidemia: Secondary | ICD-10-CM

## 2022-05-27 DIAGNOSIS — B9689 Other specified bacterial agents as the cause of diseases classified elsewhere: Secondary | ICD-10-CM | POA: Diagnosis not present

## 2022-05-27 MED ORDER — ATORVASTATIN CALCIUM 40 MG PO TABS: 40.0000 mg | ORAL_TABLET | Freq: Every day | ORAL | 1 refills | Status: DC

## 2022-05-27 MED ORDER — MUPIROCIN 2 % EX OINT: 1.0000 | TOPICAL_OINTMENT | Freq: Two times a day (BID) | CUTANEOUS | 0 refills | Status: DC

## 2022-05-27 MED ORDER — METOPROLOL SUCCINATE ER 25 MG PO TB24: 12.5000 mg | ORAL_TABLET | Freq: Every day | ORAL | 1 refills | Status: DC

## 2022-05-27 MED ORDER — TAMSULOSIN HCL 0.4 MG PO CAPS: 0.4000 mg | ORAL_CAPSULE | Freq: Every day | ORAL | 1 refills | Status: DC

## 2022-05-27 NOTE — Progress Notes (Signed)
Date:  05/27/2022   Name:  Stuart Jenkins   DOB:  [birth year]   MRN:  [id number]   Chief Complaint: Hypertension, Hyperlipidemia, and Benign Prostatic Hypertrophy  Hypertension This is a chronic problem. The current episode started in the past 7 days. The problem has been gradually improving since onset. The problem is controlled. Pertinent negatives include no anxiety, blurred vision, chest pain, headaches, neck pain, orthopnea, palpitations, PND or shortness of breath. There are no associated agents to hypertension. There are no known risk factors for coronary artery disease. Past treatments include beta blockers. The current treatment provides moderate improvement. There are no compliance problems.  There is no history of angina, CAD/MI or CVA. There is no history of chronic renal disease, a hypertension causing med or renovascular disease.  Hyperlipidemia This is a chronic problem. The current episode started more than 1 year ago. The problem is controlled. He has no history of chronic renal disease. Pertinent negatives include no chest pain, myalgias or shortness of breath. Current antihyperlipidemic treatment includes statins. The current treatment provides moderate improvement of lipids. There are no compliance problems.   Benign Prostatic Hypertrophy This is a chronic problem. The current episode started more than 1 year ago. The problem has been gradually improving since onset. Irritative symptoms do not include frequency, nocturia or urgency. Pertinent negatives include no chills, dysuria, hematuria or nausea. Nothing aggravates the symptoms. Past treatments include nothing.    Lab Results  Component Value Date   NA 139 11/26/2021   K 4.2 11/26/2021   CO2 22 11/26/2021   GLUCOSE 96 11/26/2021   BUN 12 11/26/2021   CREATININE 1.06 11/26/2021   CALCIUM 9.7 11/26/2021   EGFR 71 11/26/2021   GFRNONAA 69 12/20/2019   Lab Results  Component Value Date   CHOL 108 11/26/2021    HDL 39 (L) 11/26/2021   LDLCALC 43 11/26/2021   TRIG 155 (H) 11/26/2021   CHOLHDL 3.1 01/31/2019   No results found for: "TSH" Lab Results  Component Value Date   HGBA1C 5.5 12/24/2014   Lab Results  Component Value Date   WBC 4.3 12/23/2014   HGB 9.9 (L) 12/24/2014   HCT 31.7 (L) 12/23/2014   MCV 90.5 12/23/2014   PLT 384 12/23/2014   Lab Results  Component Value Date   ALT 47 (H) 02/05/2022   AST 42 (H) 02/05/2022   ALKPHOS 111 02/05/2022   BILITOT 0.6 02/05/2022   No results found for: "25OHVITD2", "25OHVITD3", "VD25OH"   Review of Systems  Constitutional:  Negative for chills and fever.  HENT:  Negative for drooling, ear discharge, ear pain and sore throat.   Eyes:  Negative for blurred vision.  Respiratory:  Negative for cough, shortness of breath and wheezing.   Cardiovascular:  Negative for chest pain, palpitations, orthopnea, leg swelling and PND.  Gastrointestinal:  Negative for abdominal pain, blood in stool, constipation, diarrhea and nausea.  Endocrine: Negative for polydipsia.  Genitourinary:  Negative for dysuria, frequency, hematuria, nocturia and urgency.  Musculoskeletal:  Negative for back pain, myalgias and neck pain.  Skin:  Negative for rash.  Allergic/Immunologic: Negative for environmental allergies.  Neurological:  Negative for dizziness and headaches.  Hematological:  Does not bruise/bleed easily.  Psychiatric/Behavioral:  Negative for suicidal ideas. The patient is not nervous/anxious.     Patient Active Problem List   Diagnosis Date Noted   Infected abrasion of skin of right forearm 10/22/2021   Pain in limb 02/12/2018   Coronary  artery disease involving coronary bypass graft of native heart without angina pectoris 07/30/2017   Mixed hyperlipidemia 09/11/2016   Benign prostatic hyperplasia with lower urinary tract symptoms 09/11/2016   Hematospermia 07/10/2015   Essential hypertension 06/12/2015   Cervical disc disorder 04/10/2015    Hypotension 12/24/2014   Hyponatremia 12/24/2014   Anemia 12/24/2014   Hyperglycemia 12/24/2014   Syncopal episodes 12/23/2014   Acute postoperative pain 12/07/2014   Cigarette smoker 12/06/2014   Primary localized osteoarthrosis, hand 11/17/2013   Chronic prostatitis 03/09/2012    Allergies  Allergen Reactions   Oxycodone Hcl Other (See Comments)    Altered mental status   Sulfa Antibiotics Rash    Past Surgical History:  Procedure Laterality Date   APPENDECTOMY     CARDIAC CATHETERIZATION N/A 11/08/2014   Procedure: Left Heart Cath and Coronary Angiography;  Surgeon: Yolonda Kida, MD;  Location: [facility];  Service: Cardiovascular;  Laterality: N/A;   CARDIOVERSION     CATARACT EXTRACTION W/PHACO Right 06/01/2018   Procedure: CATARACT EXTRACTION PHACO AND INTRAOCULAR LENS PLACEMENT ([place]) RIGHT;  Surgeon: Birder Robson, MD;  Location: [facility];  Service: Ophthalmology;  Laterality: Right;  [place] 00:50.5 CDE 7.01 Fluid Pack Lot # [id number]   CATARACT EXTRACTION W/PHACO Left 08/10/2018   Procedure: CATARACT EXTRACTION PHACO AND INTRAOCULAR LENS PLACEMENT ([place])  COMPLICATED LEFT MALYUGIN;  Surgeon: Birder Robson, MD;  Location: [facility];  Service: Ophthalmology;  Laterality: Left;  MALYUGIN   COLONOSCOPY     COLONOSCOPY WITH PROPOFOL N/A 02/23/2017   Procedure: COLONOSCOPY WITH PROPOFOL;  Surgeon: Lollie Sails, MD;  Location: [facility];  Service: Endoscopy;  Laterality: N/A;   CORONARY ANGIOPLASTY     CORONARY ARTERY BYPASS GRAFT     ELECTROPHYSIOLOGIC STUDY N/A 01/30/2015   Procedure: Cardioversion;  Surgeon: Yolonda Kida, MD;  Location: [facility];  Service: Cardiovascular;  Laterality: N/A;   FRACTURE SURGERY Left    arm, knee, collar bone   FRACTURE SURGERY     GASTRIC RESECTION     partial   open heart surgery  12/05/2014    Social History   Tobacco Use   Smoking status: Former    Types: Cigarettes    Quit date: 11/07/1984     Years since quitting: 37.5   Smokeless tobacco: Never  Vaping Use   Vaping Use: Never used  Substance Use Topics   Alcohol use: Not Currently    Comment: less than 1 drink/week   Drug use: No     Medication list has been reviewed and updated.  Current Meds  Medication Sig   amLODipine (NORVASC) 2.5 MG tablet Take 2.5 mg by mouth daily.   aspirin EC 81 MG tablet Take 1 tablet (81 mg total) by mouth daily.   atorvastatin (LIPITOR) 40 MG tablet Take 1 tablet (40 mg total) by mouth daily.   metoprolol succinate (TOPROL-XL) 25 MG 24 hr tablet Take 0.5 tablets (12.5 mg total) by mouth daily. One a day   tamsulosin (FLOMAX) 0.4 MG CAPS capsule Take 1 capsule (0.4 mg total) by mouth daily.   [DISCONTINUED] doxycycline (VIBRAMYCIN) 100 MG capsule Take 1 capsule (100 mg total) by mouth 2 (two) times daily.       05/27/2022    7:54 AM 11/26/2021    8:07 AM 11/05/2021    2:18 PM 10/21/2021    4:02 PM  GAD 7 : Generalized Anxiety Score  Nervous, Anxious, on Edge 0 0 0 0  Control/stop worrying 0  0 0 0  Worry too much - different things 0 0 0 0  Trouble relaxing 0 0 0 0  Restless 0 0 0 0  Easily annoyed or irritable 0 0 0 0  Afraid - awful might happen 0 0 0 0  Total GAD 7 Score 0 0 0 0  Anxiety Difficulty Not difficult at all Not difficult at all Not difficult at all        05/27/2022    7:54 AM 11/26/2021    8:07 AM 11/05/2021    2:17 PM  Depression screen PHQ 2/9  Decreased Interest 0 0 0  Down, Depressed, Hopeless 0 0 0  PHQ - 2 Score 0 0 0  Altered sleeping 0 0 1  Tired, decreased energy 0 0 0  Change in appetite 0 0 0  Feeling bad or failure about yourself  0 0 0  Trouble concentrating 0 0 0  Moving slowly or fidgety/restless 0 0 0  Suicidal thoughts 0 0 0  PHQ-9 Score 0 0 1  Difficult doing work/chores Not difficult at all Not difficult at all Not difficult at all    BP Readings from Last 3 Encounters:  05/27/22 (!) 138/92  01/02/22 (!) 155/92  11/26/21 130/80     Physical Exam Vitals and nursing note reviewed.  HENT:     Head: Normocephalic.     Right Ear: Tympanic membrane and external ear normal.     Left Ear: Tympanic membrane and external ear normal.     Nose: Nose normal.     Mouth/Throat:     Mouth: Mucous membranes are moist.  Eyes:     General: No scleral icterus.       Right eye: No discharge.        Left eye: No discharge.     Conjunctiva/sclera: Conjunctivae normal.     Pupils: Pupils are equal, round, and reactive to light.  Neck:     Thyroid: No thyromegaly.     Vascular: No JVD.     Trachea: No tracheal deviation.  Cardiovascular:     Rate and Rhythm: Normal rate and regular rhythm.     Pulses: Normal pulses.     Heart sounds: Normal heart sounds and S1 normal. No murmur heard.    No systolic murmur is present.     No diastolic murmur is present.     No friction rub. No gallop. No S3 or S4 sounds.  Pulmonary:     Effort: No respiratory distress.     Breath sounds: Normal breath sounds. No wheezing, rhonchi or rales.  Abdominal:     General: Bowel sounds are normal.     Palpations: Abdomen is soft. There is no mass.     Tenderness: There is no abdominal tenderness. There is no guarding or rebound.  Musculoskeletal:        General: No tenderness. Normal range of motion.     Cervical back: Normal range of motion and neck supple.  Lymphadenopathy:     Cervical: No cervical adenopathy.  Skin:    General: Skin is warm.     Findings: No rash.  Neurological:     Mental Status: He is alert.     Deep Tendon Reflexes: Reflexes are normal and symmetric.     Wt Readings from Last 3 Encounters:  05/27/22 159 lb (72.1 kg)  01/02/22 165 lb (74.8 kg)  11/26/21 158 lb (71.7 kg)    BP (!) 138/92 (BP Location: Right Arm, Cuff  Size: Large)   Pulse 64   Ht '5\' 10"'$  (1.778 m)   Wt 159 lb (72.1 kg)   SpO2 95%   BMI 22.81 kg/m   Assessment and Plan:  1. Mixed hyperlipidemia Chronic.  Controlled.  Stable.  Reviewed  previous lipid panel acceptable.  Will continue atorvastatin 40 mg once a day.  Will recheck in 6 months. - atorvastatin (LIPITOR) 40 MG tablet; Take 1 tablet (40 mg total) by mouth daily.  Dispense: 90 tablet; Refill: 1  2. Essential hypertension .  Controlled.  Stable.  Blood pressure 138/92.  Asymptomatic.  Tolerating medication well.  Continue metoprolol XL 25 mg 1/2 tablet daily. - metoprolol succinate (TOPROL-XL) 25 MG 24 hr tablet; Take 0.5 tablets (12.5 mg total) by mouth daily. One a day  Dispense: 90 tablet; Refill: 1  3. Benign prostatic hyperplasia with lower urinary tract symptoms, symptom details unspecified Chronic.  Controlled.  Stable.  Continue tamsulosin 0.4 mg once a day. - tamsulosin (FLOMAX) 0.4 MG CAPS capsule; Take 1 capsule (0.4 mg total) by mouth daily.  Dispense: 90 capsule; Refill: 1  4. Onychomycosis Chronic.  Newly brought to my attention.  Either dystrophic nail versus onychomycosis.  We will refer to podiatry. - Ambulatory referral to Podiatry  5. Bacterial skin infection Patient has an area on his left foot which comes and goes using Neosporin ointment on it.  Currently has been there for about a year and seems to have a little blister with some secondary erythema surrounding it.  We will have a trial of Bactroban 2% twice a day and if unresolved will refer to podiatry or dermatology if necessary. - mupirocin ointment (BACTROBAN) 2 %; Apply 1 Application topically 2 (two) times daily.  Dispense: 22 g; Refill: 0    Otilio Miu, MD

## 2022-06-23 DIAGNOSIS — B353 Tinea pedis: Secondary | ICD-10-CM | POA: Diagnosis not present

## 2022-07-14 DIAGNOSIS — Z961 Presence of intraocular lens: Secondary | ICD-10-CM | POA: Diagnosis not present

## 2022-07-14 DIAGNOSIS — D3132 Benign neoplasm of left choroid: Secondary | ICD-10-CM | POA: Diagnosis not present

## 2022-08-04 DIAGNOSIS — B353 Tinea pedis: Secondary | ICD-10-CM | POA: Diagnosis not present

## 2022-08-11 DIAGNOSIS — B351 Tinea unguium: Secondary | ICD-10-CM | POA: Diagnosis not present

## 2022-08-11 DIAGNOSIS — M79674 Pain in right toe(s): Secondary | ICD-10-CM | POA: Diagnosis not present

## 2022-09-22 DIAGNOSIS — R1032 Left lower quadrant pain: Secondary | ICD-10-CM | POA: Diagnosis not present

## 2022-09-22 DIAGNOSIS — R1012 Left upper quadrant pain: Secondary | ICD-10-CM | POA: Diagnosis not present

## 2022-09-22 DIAGNOSIS — R7401 Elevation of levels of liver transaminase levels: Secondary | ICD-10-CM | POA: Diagnosis not present

## 2022-10-01 ENCOUNTER — Other Ambulatory Visit: Payer: Self-pay

## 2022-10-01 DIAGNOSIS — R1032 Left lower quadrant pain: Secondary | ICD-10-CM

## 2022-10-06 ENCOUNTER — Ambulatory Visit
Admission: RE | Admit: 2022-10-06 | Discharge: 2022-10-06 | Disposition: A | Discharge status: Home / Self Care | Payer: Medicare HMO | Source: Ambulatory Visit | Attending: Gastroenterology | Admitting: Gastroenterology

## 2022-10-06 DIAGNOSIS — R1032 Left lower quadrant pain: Secondary | ICD-10-CM | POA: Diagnosis not present

## 2022-10-06 DIAGNOSIS — N281 Cyst of kidney, acquired: Secondary | ICD-10-CM | POA: Diagnosis not present

## 2022-10-06 MED ORDER — IOHEXOL 300 MG/ML  SOLN
100.0000 mL | Freq: Once | INTRAMUSCULAR | Status: AC | PRN
  Administered 2022-10-06: 100 mL via INTRAVENOUS

## 2022-11-03 DIAGNOSIS — Z87898 Personal history of other specified conditions: Secondary | ICD-10-CM | POA: Diagnosis not present

## 2022-11-03 DIAGNOSIS — K219 Gastro-esophageal reflux disease without esophagitis: Secondary | ICD-10-CM | POA: Diagnosis not present

## 2022-11-12 ENCOUNTER — Ambulatory Visit (INDEPENDENT_AMBULATORY_CARE_PROVIDER_SITE_OTHER): Payer: Medicare HMO

## 2022-11-12 VITALS — BP 140/70 | Ht 70.0 in | Wt 160.6 lb

## 2022-11-12 DIAGNOSIS — Z0000 Encounter for general adult medical examination without abnormal findings: Secondary | ICD-10-CM | POA: Diagnosis not present

## 2022-11-12 NOTE — Patient Instructions (Signed)
Stuart Jenkins , Thank you for taking time to come for your Medicare Wellness Visit. I appreciate your ongoing commitment to your health goals. Please review the following plan we discussed and let me know if I can assist you in the future.   Referrals/Orders/Follow-Ups/Clinician Recommendations: none  This is a list of the screening recommended for you and due dates:  Health Maintenance  Topic Date Due   COVID-19 Vaccine (4 - 2023-24 season) 11/22/2021   Flu Shot  10/23/2022   Medicare Annual Wellness Visit  11/12/2023   DTaP/Tdap/Td vaccine (2 - Tdap) 10/20/2031   Pneumonia Vaccine  Completed   Zoster (Shingles) Vaccine  Completed   HPV Vaccine  Aged Out    Advanced directives: (ACP Link)Information on Advanced Care Planning can be found at [facility] Advance Health Care Directives Advance Health Care Directives ([email or web address]   Next Medicare Annual Wellness Visit scheduled for next year: Yes   11/18/23 @ 9:45 am in person

## 2022-11-12 NOTE — Progress Notes (Signed)
Subjective:   Stuart Jenkins is a 82 y.o. male who presents for Medicare Annual/Subsequent preventive examination.  Visit Complete: In person    Review of Systems     Cardiac Risk Factors include: advanced age (>80men, >58 women);dyslipidemia;hypertension;male gender;sedentary lifestyle     Objective:    Today's Vitals   11/12/22 0955  BP: (!) 140/70  Weight: 160 lb 9.6 oz (72.8 kg)  Height: 5\' 10"  (1.778 m)   Body mass index is 23.04 kg/m.     11/12/2022   10:00 AM 01/02/2022    8:07 PM 11/05/2021    2:13 PM 07/30/2020   11:27 AM 07/27/2019   11:28 AM 08/10/2018    9:46 AM 06/01/2018    9:24 AM  Advanced Directives  Does Patient Have a Medical Advance Directive? No No No No No No No  Would patient like information on creating a medical advance directive? No - Patient declined No - Patient declined Yes (MAU/Ambulatory/Procedural Areas - Information given) Yes (MAU/Ambulatory/Procedural Areas - Information given) Yes (MAU/Ambulatory/Procedural Areas - Information given) No - Patient declined Yes (ED - Information included in AVS)    Current Medications (verified) Outpatient Encounter Medications as of 11/12/2022  Medication Sig   amLODipine (NORVASC) 2.5 MG tablet Take 2.5 mg by mouth daily.   aspirin EC 81 MG tablet Take 1 tablet (81 mg total) by mouth daily.   atorvastatin (LIPITOR) 40 MG tablet Take 1 tablet (40 mg total) by mouth daily.   metoprolol succinate (TOPROL-XL) 25 MG 24 hr tablet Take 0.5 tablets (12.5 mg total) by mouth daily. One a day   mupirocin ointment (BACTROBAN) 2 % Apply 1 Application topically 2 (two) times daily.   omeprazole (PRILOSEC) 20 MG capsule Take 20 mg by mouth daily.   tamsulosin (FLOMAX) 0.4 MG CAPS capsule Take 1 capsule (0.4 mg total) by mouth daily.   No facility-administered encounter medications on file as of 11/12/2022.    Allergies (verified) Oxycodone hcl and Sulfa antibiotics   History: Past Medical History:  Diagnosis  Date   Atrial fibrillation (HCC)    BPH (benign prostatic hypertrophy)    Colon polyp    Coronary artery disease    Dysrhythmia    AFIB POST CABG / CARDIOVERSION   Gastric bleed    SURGERY   Gout    Hyperlipidemia    Hypertension    Peptic ulcer disease    Past Surgical History:  Procedure Laterality Date   APPENDECTOMY     CARDIAC CATHETERIZATION N/A 11/08/2014   Procedure: Left Heart Cath and Coronary Angiography;  Surgeon: Alwyn Pea, MD;  Location: [facility];  Service: Cardiovascular;  Laterality: N/A;   CARDIOVERSION     CATARACT EXTRACTION W/PHACO Right 06/01/2018   Procedure: CATARACT EXTRACTION PHACO AND INTRAOCULAR LENS PLACEMENT (IOC) RIGHT;  Surgeon: Galen Manila, MD;  Location: [facility] ORS;  Service: Ophthalmology;  Laterality: Right;  [place] 00:50.5 CDE 7.01 Fluid Pack Lot # [id number]   CATARACT EXTRACTION W/PHACO Left 08/10/2018   Procedure: CATARACT EXTRACTION PHACO AND INTRAOCULAR LENS PLACEMENT (IOC)  COMPLICATED LEFT MALYUGIN;  Surgeon: Galen Manila, MD;  Location: [facility];  Service: Ophthalmology;  Laterality: Left;  MALYUGIN   COLONOSCOPY     COLONOSCOPY WITH PROPOFOL N/A 02/23/2017   Procedure: COLONOSCOPY WITH PROPOFOL;  Surgeon: Christena Deem, MD;  Location: [facility];  Service: Endoscopy;  Laterality: N/A;   CORONARY ANGIOPLASTY     CORONARY ARTERY BYPASS GRAFT     ELECTROPHYSIOLOGIC  STUDY N/A 01/30/2015   Procedure: Cardioversion;  Surgeon: Alwyn Pea, MD;  Location: [facility] ORS;  Service: Cardiovascular;  Laterality: N/A;   FRACTURE SURGERY Left    arm, knee, collar bone   FRACTURE SURGERY     GASTRIC RESECTION     partial   open heart surgery  12/05/2014   Family History  Problem Relation Age of Onset   Hypertension Brother    Social History   Socioeconomic History   Marital status: Widowed    Spouse name: Not on file   Number of children: 2   Years of education: Not on file   Highest education  level: Not on file  Occupational History   Not on file  Tobacco Use   Smoking status: Former    Current packs/day: 0.00    Types: Cigarettes    Quit date: 11/07/1984    Years since quitting: 38.0   Smokeless tobacco: Never  Vaping Use   Vaping status: Never Used  Substance and Sexual Activity   Alcohol use: Not Currently    Comment: less than 1 drink/week   Drug use: No   Sexual activity: Yes  Other Topics Concern   Not on file  Social History Narrative   Pt lives alone.    Social Determinants of Health   Financial Resource Strain: Low Risk  (11/12/2022)   Overall Financial Resource Strain (CARDIA)    Difficulty of Paying Living Expenses: Not hard at all  Food Insecurity: No Food Insecurity (11/12/2022)   Hunger Vital Sign    Worried About Running Out of Food in the Last Year: Never true    Ran Out of Food in the Last Year: Never true  Transportation Needs: No Transportation Needs (11/12/2022)   PRAPARE - Administrator, Civil Service (Medical): No    Lack of Transportation (Non-Medical): No  Physical Activity: Insufficiently Active (11/12/2022)   Exercise Vital Sign    Days of Exercise per Week: 2 days    Minutes of Exercise per Session: 20 min  Stress: No Stress Concern Present (11/12/2022)   [employer] of Occupational Health - Occupational Stress Questionnaire    Feeling of Stress : Not at all  Social Connections: Socially Isolated (11/12/2022)   Social Connection and Isolation Panel [NHANES]    Frequency of Communication with Friends and Family: More than three times a week    Frequency of Social Gatherings with Friends and Family: More than three times a week    Attends Religious Services: Never    Database administrator or Organizations: No    Attends Banker Meetings: Never    Marital Status: Widowed    Tobacco Counseling Counseling given: Not Answered   Clinical Intake:  Pre-visit preparation completed: Yes  Pain : No/denies  pain     Nutritional Risks: None Diabetes: No  How often do you need to have someone help you when you read instructions, pamphlets, or other written materials from your doctor or pharmacy?: 1 - Never  Interpreter Needed?: No  Information entered by :: Kennedy Bucker, LPN   Activities of Daily Living    11/12/2022   10:01 AM  In your present state of health, do you have any difficulty performing the following activities:  Hearing? 0  Vision? 0  Difficulty concentrating or making decisions? 0  Walking or climbing stairs? 0  Dressing or bathing? 0  Doing errands, shopping? 0  Preparing Food and eating ? N  Using the  Toilet? N  In the past six months, have you accidently leaked urine? N  Do you have problems with loss of bowel control? N  Managing your Medications? N  Managing your Finances? N  Housekeeping or managing your Housekeeping? N    Patient Care Team: Duanne Limerick, MD as PCP - General (Family Medicine) Alwyn Pea, MD as Consulting Physician (Cardiology) Jesusita Oka, MD (Dermatology) Galen Manila, MD as Referring Physician (Ophthalmology)  Indicate any recent Medical Services you may have received from other than [facility] providers in the past year (date may be approximate).     Assessment:   This is a routine wellness examination for Stuart Jenkins.  Hearing/Vision screen Hearing Screening - Comments:: No aids Vision Screening - Comments:: No glasses, cataract sgy- Dr.Porfilio  Dietary issues and exercise activities discussed:     Goals Addressed             This Visit's Progress    DIET - EAT MORE FRUITS AND VEGETABLES         Depression Screen    11/12/2022    9:59 AM 05/27/2022    7:54 AM 11/26/2021    8:07 AM 11/05/2021    2:17 PM 10/21/2021    4:02 PM 05/23/2021    7:53 AM 11/23/2020    8:02 AM  PHQ 2/9 Scores  PHQ - 2 Score 0 0 0 0 0 0 0  PHQ- 9 Score 0 0 0 1 1 1  0    Fall Risk    11/12/2022   10:01 AM 05/27/2022    7:54 AM  11/26/2021    8:06 AM 11/05/2021    1:53 PM 10/21/2021    4:03 PM  Fall Risk   Falls in the past year? 0 0 0 0 0  Number falls in past yr: 0 0 0 0   Injury with Fall? 0 0 0 0 0  Risk for fall due to : No Fall Risks History of fall(s) No Fall Risks No Fall Risks   Follow up Falls prevention discussed;Falls evaluation completed Falls evaluation completed Falls evaluation completed Falls evaluation completed     MEDICARE RISK AT HOME: Medicare Risk at Home Any stairs in or around the home?: Yes If so, are there any without handrails?: Yes Home free of loose throw rugs in walkways, pet beds, electrical cords, etc?: Yes Adequate lighting in your home to reduce risk of falls?: Yes Life alert?: No Use of a cane, walker or w/c?: No Grab bars in the bathroom?: No Shower chair or bench in shower?: Yes Elevated toilet seat or a handicapped toilet?: Yes  TIMED UP AND GO:  Was the test performed?  Yes  Length of time to ambulate 10 feet: 4 sec Gait steady and fast without use of assistive device    Cognitive Function:        11/12/2022   10:02 AM 11/05/2021    2:01 PM 07/30/2020   11:29 AM 07/27/2019   11:30 AM  6CIT Screen  What Year? 0 points 0 points 0 points 0 points  What month? 0 points 0 points 0 points 0 points  What time? 0 points 0 points 0 points 0 points  Count back from 20 0 points 0 points 0 points 0 points  Months in reverse 2 points 0 points 0 points 4 points  Repeat phrase 0 points 0 points 2 points 0 points  Total Score 2 points 0 points 2 points 4 points    Immunizations Immunization  History  Administered Date(s) Administered   Fluad Quad(high Dose 65+) 12/20/2019, 11/23/2020   Influenza, High Dose Seasonal PF 12/24/2016, 12/25/2017, 11/15/2018   Influenza,inj,Quad PF,6+ Mos 11/22/2014, 11/26/2021   Influenza-Unspecified 11/23/2014, 11/15/2018   PFIZER(Purple Top)SARS-COV-2 Vaccination 03/31/2019, 04/21/2019, 01/30/2020   Pneumococcal Conjugate-13 12/31/2017    Pneumococcal Polysaccharide-23 01/31/2019   Td 10/19/2021   Zoster Recombinant(Shingrix) 12/04/2020, 05/29/2021    TDAP status: Up to date  Flu Vaccine status: Up to date  Pneumococcal vaccine status: Up to date  Covid-19 vaccine status: Completed vaccines  Qualifies for Shingles Vaccine? Yes   Zostavax completed No   Shingrix Completed?: Yes  Screening Tests Health Maintenance  Topic Date Due   COVID-19 Vaccine (4 - 2023-24 season) 11/22/2021   INFLUENZA VACCINE  10/23/2022   Medicare Annual Wellness (AWV)  11/12/2023   DTaP/Tdap/Td (2 - Tdap) 10/20/2031   Pneumonia Vaccine 67+ Years old  Completed   Zoster Vaccines- Shingrix  Completed   HPV VACCINES  Aged Out    Health Maintenance  Health Maintenance Due  Topic Date Due   COVID-19 Vaccine (4 - 2023-24 season) 11/22/2021   INFLUENZA VACCINE  10/23/2022    Colorectal cancer screening: No longer required.   Lung Cancer Screening: (Low Dose CT Chest recommended if Age 45-80 years, 20 pack-year currently smoking OR have quit w/in 15years.) does not qualify.   Additional Screening:  Hepatitis C Screening: does not qualify; Completed no  Vision Screening: Recommended annual ophthalmology exams for early detection of glaucoma and other disorders of the eye. Is the patient up to date with their annual eye exam?  Yes  Who is the provider or what is the name of the office in which the patient attends annual eye exams? Dr.Porfilio If pt is not established with a provider, would they like to be referred to a provider to establish care? No .   Dental Screening: Recommended annual dental exams for proper oral hygiene  Community Resource Referral / Chronic Care Management: CRR required this visit?  No   CCM required this visit?  No     Plan:     I have personally reviewed and noted the following in the patient's chart:   Medical and social history Use of alcohol, tobacco or illicit drugs  Current medications and  supplements including opioid prescriptions. Patient is not currently taking opioid prescriptions. Functional ability and status Nutritional status Physical activity Advanced directives List of other physicians Hospitalizations, surgeries, and ER visits in previous 12 months Vitals Screenings to include cognitive, depression, and falls Referrals and appointments  In addition, I have reviewed and discussed with patient certain preventive protocols, quality metrics, and best practice recommendations. A written personalized care plan for preventive services as well as general preventive health recommendations were provided to patient.     Hal Hope, LPN   1/61/0960   After Visit Summary: (MyChart) Due to this being a telephonic visit, the after visit summary with patients personalized plan was offered to patient via MyChart   Nurse Notes: none

## 2022-11-14 ENCOUNTER — Other Ambulatory Visit: Payer: Self-pay | Admitting: Family Medicine

## 2022-11-14 DIAGNOSIS — E782 Mixed hyperlipidemia: Secondary | ICD-10-CM

## 2022-11-27 ENCOUNTER — Ambulatory Visit: Payer: Medicare HMO | Admitting: Family Medicine

## 2022-12-12 DIAGNOSIS — M545 Low back pain, unspecified: Secondary | ICD-10-CM | POA: Diagnosis not present

## 2022-12-15 ENCOUNTER — Ambulatory Visit (INDEPENDENT_AMBULATORY_CARE_PROVIDER_SITE_OTHER): Payer: Medicare HMO | Admitting: Family Medicine

## 2022-12-15 ENCOUNTER — Encounter: Payer: Self-pay | Admitting: Family Medicine

## 2022-12-15 VITALS — BP 128/78 | HR 70 | Ht 70.0 in | Wt 162.0 lb

## 2022-12-15 DIAGNOSIS — Z23 Encounter for immunization: Secondary | ICD-10-CM | POA: Diagnosis not present

## 2022-12-15 DIAGNOSIS — E782 Mixed hyperlipidemia: Secondary | ICD-10-CM | POA: Diagnosis not present

## 2022-12-15 DIAGNOSIS — N401 Enlarged prostate with lower urinary tract symptoms: Secondary | ICD-10-CM | POA: Diagnosis not present

## 2022-12-15 DIAGNOSIS — I10 Essential (primary) hypertension: Secondary | ICD-10-CM | POA: Diagnosis not present

## 2022-12-15 MED ORDER — ATORVASTATIN CALCIUM 40 MG PO TABS: 40.0000 mg | ORAL_TABLET | Freq: Every day | ORAL | Status: DC

## 2022-12-15 MED ORDER — METOPROLOL SUCCINATE ER 25 MG PO TB24: 12.5000 mg | ORAL_TABLET | Freq: Every day | ORAL | Status: DC

## 2022-12-15 MED ORDER — TAMSULOSIN HCL 0.4 MG PO CAPS: 0.4000 mg | ORAL_CAPSULE | Freq: Every day | ORAL | 1 refills | Status: DC

## 2022-12-15 NOTE — Progress Notes (Signed)
Date:  12/15/2022   Name:  Stuart Jenkins   DOB:  04-26-1940   MRN:  [id number]   Chief Complaint: Benign Prostatic Hypertrophy, Hyperlipidemia, Hypertension, and Flu Vaccine  Benign Prostatic Hypertrophy This is a chronic problem. The current episode started more than 1 month ago. The problem has been gradually improving since onset. Irritative symptoms include nocturia. Irritative symptoms do not include frequency or urgency. Obstructive symptoms do not include dribbling, a slower stream, straining or a weak stream. Pertinent negatives include no chills, dysuria, genital pain, hematuria, hesitancy or vomiting. Nothing aggravates the symptoms. Past treatments include nothing. The treatment provided moderate relief.  Hyperlipidemia This is a chronic problem. The current episode started more than 1 year ago. The problem is controlled. Recent lipid tests were reviewed and are normal. He has no history of chronic renal disease. There are no known factors aggravating his hyperlipidemia. Pertinent negatives include no chest pain, focal weakness, leg pain, myalgias or shortness of breath. (Except most recent) Current antihyperlipidemic treatment includes statins. The current treatment provides moderate improvement of lipids. There are no compliance problems.   Hypertension This is a chronic problem. The current episode started more than 1 year ago. The problem has been gradually improving since onset. The problem is controlled. Pertinent negatives include no chest pain, headaches, palpitations, PND or shortness of breath. Risk factors for coronary artery disease include dyslipidemia. Past treatments include calcium channel blockers. The current treatment provides moderate improvement. There is no history of chronic renal disease, a hypertension causing med or renovascular disease.    Lab Results  Component Value Date   NA 139 11/26/2021   K 4.2 11/26/2021   CO2 22 11/26/2021   GLUCOSE 96  11/26/2021   BUN 12 11/26/2021   CREATININE 1.06 11/26/2021   CALCIUM 9.7 11/26/2021   EGFR 71 11/26/2021   GFRNONAA 69 12/20/2019   Lab Results  Component Value Date   CHOL 108 11/26/2021   HDL 39 (L) 11/26/2021   LDLCALC 43 11/26/2021   TRIG 155 (H) 11/26/2021   CHOLHDL 3.1 01/31/2019   No results found for: "TSH" Lab Results  Component Value Date   HGBA1C 5.5 12/24/2014   Lab Results  Component Value Date   WBC 4.3 12/23/2014   HGB 9.9 (L) 12/24/2014   HCT 31.7 (L) 12/23/2014   MCV 90.5 12/23/2014   PLT 384 12/23/2014   Lab Results  Component Value Date   ALT 47 (H) 02/05/2022   AST 42 (H) 02/05/2022   ALKPHOS 111 02/05/2022   BILITOT 0.6 02/05/2022   No results found for: "25OHVITD2", "25OHVITD3", "VD25OH"   Review of Systems  Constitutional:  Negative for chills and fatigue.  HENT:  Negative for congestion and sinus pain.   Respiratory:  Negative for cough, chest tightness, shortness of breath and wheezing.   Cardiovascular:  Negative for chest pain, palpitations and PND.  Gastrointestinal:  Negative for abdominal pain and vomiting.  Genitourinary:  Positive for nocturia. Negative for dysuria, frequency, hematuria, hesitancy and urgency.  Musculoskeletal:  Negative for myalgias.  Neurological:  Negative for focal weakness and headaches.    Patient Active Problem List   Diagnosis Date Noted   Infected abrasion of skin of right forearm 10/22/2021   Pain in limb 02/12/2018   Coronary artery disease involving coronary bypass graft of native heart without angina pectoris 07/30/2017   Mixed hyperlipidemia 09/11/2016   Benign prostatic hyperplasia with lower urinary tract symptoms 09/11/2016   Hematospermia 07/10/2015  Essential hypertension 06/12/2015   Cervical disc disorder 04/10/2015   Hypotension 12/24/2014   Hyponatremia 12/24/2014   Anemia 12/24/2014   Hyperglycemia 12/24/2014   Syncopal episodes 12/23/2014   Acute postoperative pain 12/07/2014    Cigarette smoker 12/06/2014   Primary localized osteoarthrosis, hand 11/17/2013   Chronic prostatitis 03/09/2012    Allergies  Allergen Reactions   Oxycodone Hcl Other (See Comments)    Altered mental status   Sulfa Antibiotics Rash    Past Surgical History:  Procedure Laterality Date   APPENDECTOMY     CARDIAC CATHETERIZATION N/A 11/08/2014   Procedure: Left Heart Cath and Coronary Angiography;  Surgeon: Alwyn Pea, MD;  Location: [facility];  Service: Cardiovascular;  Laterality: N/A;   CARDIOVERSION     CATARACT EXTRACTION W/PHACO Right 06/01/2018   Procedure: CATARACT EXTRACTION PHACO AND INTRAOCULAR LENS PLACEMENT (IOC) RIGHT;  Surgeon: Galen Manila, MD;  Location: [facility] ORS;  Service: Ophthalmology;  Laterality: Right;  [place] 00:50.5 CDE 7.01 Fluid Pack Lot # [id number]   CATARACT EXTRACTION W/PHACO Left 08/10/2018   Procedure: CATARACT EXTRACTION PHACO AND INTRAOCULAR LENS PLACEMENT (IOC)  COMPLICATED LEFT MALYUGIN;  Surgeon: Galen Manila, MD;  Location: [facility];  Service: Ophthalmology;  Laterality: Left;  MALYUGIN   COLONOSCOPY     COLONOSCOPY WITH PROPOFOL N/A 02/23/2017   Procedure: COLONOSCOPY WITH PROPOFOL;  Surgeon: Christena Deem, MD;  Location: [facility] ENDOSCOPY;  Service: Endoscopy;  Laterality: N/A;   CORONARY ANGIOPLASTY     CORONARY ARTERY BYPASS GRAFT     ELECTROPHYSIOLOGIC STUDY N/A 01/30/2015   Procedure: Cardioversion;  Surgeon: Alwyn Pea, MD;  Location: [facility] ORS;  Service: Cardiovascular;  Laterality: N/A;   FRACTURE SURGERY Left    arm, knee, collar bone   FRACTURE SURGERY     GASTRIC RESECTION     partial   open heart surgery  12/05/2014    Social History   Tobacco Use   Smoking status: Former    Current packs/day: 0.00    Types: Cigarettes    Quit date: 11/07/1984    Years since quitting: 38.1   Smokeless tobacco: Never  Vaping Use   Vaping status: Never Used  Substance Use Topics   Alcohol use: Not  Currently    Comment: less than 1 drink/week   Drug use: No     Medication list has been reviewed and updated.  Current Meds  Medication Sig   amLODipine (NORVASC) 2.5 MG tablet Take 2.5 mg by mouth daily.   aspirin EC 81 MG tablet Take 1 tablet (81 mg total) by mouth daily.   atorvastatin (LIPITOR) 40 MG tablet TAKE 1 TABLET BY MOUTH EVERY DAY   metoprolol succinate (TOPROL-XL) 25 MG 24 hr tablet Take 0.5 tablets (12.5 mg total) by mouth daily. One a day   mupirocin ointment (BACTROBAN) 2 % Apply 1 Application topically 2 (two) times daily.   naproxen (NAPROSYN) 500 MG tablet Take 500 mg by mouth 2 (two) times daily.   omeprazole (PRILOSEC) 20 MG capsule Take 20 mg by mouth daily.   tamsulosin (FLOMAX) 0.4 MG CAPS capsule Take 1 capsule (0.4 mg total) by mouth daily.   tiZANidine (ZANAFLEX) 2 MG tablet Take 2 mg by mouth every 8 (eight) hours as needed.       12/15/2022    2:42 PM 05/27/2022    7:54 AM 11/26/2021    8:07 AM 11/05/2021    2:18 PM  GAD 7 : Generalized Anxiety Score  Nervous,  Anxious, on Edge 0 0 0 0  Control/stop worrying 0 0 0 0  Worry too much - different things 0 0 0 0  Trouble relaxing 0 0 0 0  Restless 0 0 0 0  Easily annoyed or irritable 0 0 0 0  Afraid - awful might happen 0 0 0 0  Total GAD 7 Score 0 0 0 0  Anxiety Difficulty Not difficult at all Not difficult at all Not difficult at all Not difficult at all       12/15/2022    2:42 PM 11/12/2022    9:59 AM 05/27/2022    7:54 AM  Depression screen PHQ 2/9  Decreased Interest 0 0 0  Down, Depressed, Hopeless 0 0 0  PHQ - 2 Score 0 0 0  Altered sleeping 0 0 0  Tired, decreased energy 0 0 0  Change in appetite 0 0 0  Feeling bad or failure about yourself  0 0 0  Trouble concentrating 0 0 0  Moving slowly or fidgety/restless 0 0 0  Suicidal thoughts 0 0 0  PHQ-9 Score 0 0 0  Difficult doing work/chores Not difficult at all Not difficult at all Not difficult at all    BP Readings from Last 3  Encounters:  12/15/22 128/78  11/12/22 (!) 140/70  05/27/22 136/88    Physical Exam Vitals and nursing note reviewed.  HENT:     Head: Normocephalic.     Right Ear: Tympanic membrane, ear canal and external ear normal. There is no impacted cerumen.     Left Ear: Tympanic membrane, ear canal and external ear normal. There is no impacted cerumen.     Nose: Nose normal. No congestion or rhinorrhea.     Mouth/Throat:     Mouth: Mucous membranes are moist.     Pharynx: Oropharynx is clear. No oropharyngeal exudate.  Eyes:     General: No scleral icterus.       Right eye: No discharge.        Left eye: No discharge.     Conjunctiva/sclera: Conjunctivae normal.     Pupils: Pupils are equal, round, and reactive to light.  Neck:     Thyroid: No thyromegaly.     Vascular: No JVD.     Trachea: No tracheal deviation.  Cardiovascular:     Rate and Rhythm: Normal rate and regular rhythm.     Heart sounds: Normal heart sounds. No murmur heard.    No friction rub. No gallop.  Pulmonary:     Effort: No respiratory distress.     Breath sounds: Normal breath sounds. No wheezing, rhonchi or rales.  Abdominal:     General: Bowel sounds are normal.     Palpations: Abdomen is soft. There is no mass.     Tenderness: There is no abdominal tenderness. There is no guarding or rebound.  Musculoskeletal:        General: No tenderness. Normal range of motion.     Cervical back: Normal range of motion and neck supple.  Lymphadenopathy:     Cervical: No cervical adenopathy.  Skin:    General: Skin is warm.     Findings: No rash.  Neurological:     Mental Status: He is alert and oriented to person, place, and time.     Cranial Nerves: No cranial nerve deficit.     Deep Tendon Reflexes: Reflexes are normal and symmetric.     Wt Readings from Last 3 Encounters:  12/15/22 162  lb (73.5 kg)  11/12/22 160 lb 9.6 oz (72.8 kg)  05/27/22 159 lb (72.1 kg)    BP 128/78   Pulse 70   Ht 5\' 10"  (1.778  m)   Wt 162 lb (73.5 kg)   SpO2 97%   BMI 23.24 kg/m   Assessment and Plan: 1. Essential hypertension Chronic.  Controlled.  Stable.  Blood pressure today is 128/78.  Asymptomatic.  Tolerating medication well.  Continue Toprol-XL 25 mg 1/2 tablet once a day.  Will recheck in 6 months - metoprolol succinate (TOPROL-XL) 25 MG 24 hr tablet; Take 0.5 tablets (12.5 mg total) by mouth daily. One a day  2. Mixed hyperlipidemia Chronic.  Controlled.  Stable.  Continue atorvastatin 40 mg once a day.  Will recheck in 6 months. - atorvastatin (LIPITOR) 40 MG tablet; Take 1 tablet (40 mg total) by mouth daily.  3. Benign prostatic hyperplasia with lower urinary tract symptoms, symptom details unspecified Chronic.  Controlled.  Stable.  Continue Flomax 0.4 mg once a day.  Will recheck in 6 months. - tamsulosin (FLOMAX) 0.4 MG CAPS capsule; Take 1 capsule (0.4 mg total) by mouth daily.  Dispense: 90 capsule; Refill: 1  4. Need for immunization against influenza Discussed and administered. - Flu Vaccine Trivalent High Dose (Fluad)   Follow-up for patient also who fell while mowing his yard sustaining chest wall contusions.  Patient is currently doing well on tizanidine and nonsteroidal anti-inflammatoryI  Elizabeth Sauer, MD

## 2022-12-23 DIAGNOSIS — M545 Low back pain, unspecified: Secondary | ICD-10-CM | POA: Diagnosis not present

## 2023-02-02 DIAGNOSIS — I2510 Atherosclerotic heart disease of native coronary artery without angina pectoris: Secondary | ICD-10-CM | POA: Diagnosis not present

## 2023-02-02 DIAGNOSIS — I10 Essential (primary) hypertension: Secondary | ICD-10-CM | POA: Diagnosis not present

## 2023-02-02 DIAGNOSIS — Z951 Presence of aortocoronary bypass graft: Secondary | ICD-10-CM | POA: Diagnosis not present

## 2023-02-02 DIAGNOSIS — F17210 Nicotine dependence, cigarettes, uncomplicated: Secondary | ICD-10-CM | POA: Diagnosis not present

## 2023-02-02 DIAGNOSIS — E782 Mixed hyperlipidemia: Secondary | ICD-10-CM | POA: Diagnosis not present

## 2023-02-02 DIAGNOSIS — Z720 Tobacco use: Secondary | ICD-10-CM | POA: Diagnosis not present

## 2023-02-02 DIAGNOSIS — Z87891 Personal history of nicotine dependence: Secondary | ICD-10-CM | POA: Diagnosis not present

## 2023-02-02 DIAGNOSIS — K279 Peptic ulcer, site unspecified, unspecified as acute or chronic, without hemorrhage or perforation: Secondary | ICD-10-CM | POA: Diagnosis not present

## 2023-02-02 DIAGNOSIS — R001 Bradycardia, unspecified: Secondary | ICD-10-CM | POA: Diagnosis not present

## 2023-02-02 DIAGNOSIS — I959 Hypotension, unspecified: Secondary | ICD-10-CM | POA: Diagnosis not present

## 2023-02-06 ENCOUNTER — Other Ambulatory Visit: Payer: Self-pay | Admitting: Family Medicine

## 2023-02-06 DIAGNOSIS — E782 Mixed hyperlipidemia: Secondary | ICD-10-CM

## 2023-02-11 DIAGNOSIS — M7551 Bursitis of right shoulder: Secondary | ICD-10-CM | POA: Diagnosis not present

## 2023-02-11 DIAGNOSIS — M25511 Pain in right shoulder: Secondary | ICD-10-CM | POA: Diagnosis not present

## 2023-02-11 DIAGNOSIS — G8929 Other chronic pain: Secondary | ICD-10-CM | POA: Diagnosis not present

## 2023-05-07 ENCOUNTER — Encounter: Payer: Self-pay | Admitting: Family Medicine

## 2023-05-07 ENCOUNTER — Ambulatory Visit: Payer: Medicare HMO | Admitting: Family Medicine

## 2023-05-07 VITALS — BP 140/90 | HR 77 | Ht 70.0 in | Wt 155.6 lb

## 2023-05-07 DIAGNOSIS — M25562 Pain in left knee: Secondary | ICD-10-CM

## 2023-05-07 DIAGNOSIS — M1712 Unilateral primary osteoarthritis, left knee: Secondary | ICD-10-CM | POA: Insufficient documentation

## 2023-05-07 DIAGNOSIS — S83242A Other tear of medial meniscus, current injury, left knee, initial encounter: Secondary | ICD-10-CM | POA: Diagnosis not present

## 2023-05-07 MED ORDER — CELECOXIB 50 MG PO CAPS: 50.0000 mg | ORAL_CAPSULE | Freq: Every day | ORAL | 0 refills | Status: DC

## 2023-05-07 NOTE — Assessment & Plan Note (Signed)
History of Present Illness Stuart Jenkins is a 83 year old male who presents with left knee pain following an injury.  He experienced left knee pain following an injury five days ago when he stepped into a hole in his backyard, twisting his knee and hearing a 'pop', which led to immediate pain. The pain is diffuse, affecting the entire knee and extending into the front of the thigh. He experiences pain when transitioning from sitting to standing and when getting into a car, requiring him to lift his leg manually.  He is able to bear weight on the left leg, albeit gingerly. He reports swelling in the knee but no significant bruising. The pain has slightly improved over the past five days but has not worsened. He attempted to manage the pain with ice, which he applied once, but found it did not provide significant relief.  During the review of symptoms, he reports mild pain in the hip or groin area on the left side, particularly in the front of the thigh. No pain while sitting with the knee bent, but discomfort when standing or moving the leg.  He has a history of left knee surgery approximately 30 years ago for a fracture, during which a pin was placed. Since the surgery, he has experienced occasional aches and pains in the knee, but no significant issues until the recent injury.  Physical Exam MUSCULOSKELETAL: Mild swelling diffusely about the knee, no discoloration. Tenderness upon palpation of the medial collateral ligament throughout. Mild tenderness upon palpation of the distal medial hamstring tendons. Trace palpable popliteal cyst on the posterior aspect of the knee, minimal tenderness upon palpation. Range of motion of the knee from 0 to 90 degrees limited by discomfort, no mechanical obstruction noted. Palpation reveals trace to 1+ effusion. Varus stress testing elicits mild pain throughout the lateral collateral ligament without laxity.  McMurray's negative bilaterally. NEUROLOGICAL: Mild  discomfort upon resisted left hip flexion.  FADIR testing negative for hip or groin pain. Knee joint examination: Trace to 1+ effusion, mild tenderness distal medial hamstring tendons, nontender lateral joint line, medial joint line tender, nontender patellar tendon and quadriceps tendon, trace palpable cyst, minimally tender posterior knee, range of motion 0 to 90 degrees limited by discomfort, no mechanical obstruction, no laxity in LCL (05/07/2023)  Assessment and Plan Left knee arthralgia with LCL injury Patient with a history of left knee fracture 30 years ago unspecified, presented with a recent injury to the same knee after stepping in a hole and twisting it. Examination revealed mild swelling, tenderness along the medial joint line and distal medial hamstring tendons, and a trace palpable cyst. Range of motion was limited by discomfort. No mechanical obstruction noted. The patient has been experiencing pain with weight-bearing activities and has a trace to 1+ effusion. The pain is likely due to inflammation from the sprain and underlying arthritis. -Advise to wear a hinged knee brace for stabilization while ambulatory for the next two weeks. The brace can be removed during prolonged periods of sitting, sleeping, and bathing. -Prescribe Voltaren gel 1% to be applied four times daily to the knee for one week. If effective, wean from brace and medication. If ineffective, consider starting Celebrex pill daily for one week or contact for cortisone consideration. -Check-in after one week to assess the effectiveness of the treatment and adjust the plan as necessary. If the pain persists or worsens, consider cortisone injection.

## 2023-05-07 NOTE — Progress Notes (Signed)
Primary Care / Sports Medicine Office Visit  Patient Information:  Patient ID: STOCKTON NUNLEY, male DOB: 03/11/1941 Age: 83 y.o. MRN: [id number]   BIANCA VESTER is a pleasant 83 y.o. male presenting with the following:  Chief Complaint  Patient presents with   Knee Pain    Left knee pain for 1 week, stepped in a hole in the yard felt and heard a pop. Patient has had constant pain since his injury. Patient has not tried anything to relieve the pain.He does have a history of surgery on that knee about 25-30 years ago.    Vitals:   05/07/23 1024  BP: (!) 140/90  Pulse: 77  SpO2: 98%   Vitals:   05/07/23 1024  Weight: 155 lb 9.6 oz (70.6 kg)  Height: 5\' 10"  (1.778 m)   Body mass index is 22.33 kg/m.  No results found.   Independent interpretation of notes and tests performed by another provider:   None  Procedures performed:   None  Pertinent History, Exam, Impression, and Recommendations:   Problem List Items Addressed This Visit     Arthralgia of left knee - Primary   History of Present Illness KHALE NIGH is a 83 year old male who presents with left knee pain following an injury.  He experienced left knee pain following an injury five days ago when he stepped into a hole in his backyard, twisting his knee and hearing a 'pop', which led to immediate pain. The pain is diffuse, affecting the entire knee and extending into the front of the thigh. He experiences pain when transitioning from sitting to standing and when getting into a car, requiring him to lift his leg manually.  He is able to bear weight on the left leg, albeit gingerly. He reports swelling in the knee but no significant bruising. The pain has slightly improved over the past five days but has not worsened. He attempted to manage the pain with ice, which he applied once, but found it did not provide significant relief.  During the review of symptoms, he reports mild pain in the hip or  groin area on the left side, particularly in the front of the thigh. No pain while sitting with the knee bent, but discomfort when standing or moving the leg.  He has a history of left knee surgery approximately 30 years ago for a fracture, during which a pin was placed. Since the surgery, he has experienced occasional aches and pains in the knee, but no significant issues until the recent injury.  Physical Exam MUSCULOSKELETAL: Mild swelling diffusely about the knee, no discoloration. Tenderness upon palpation of the medial collateral ligament throughout. Mild tenderness upon palpation of the distal medial hamstring tendons. Trace palpable popliteal cyst on the posterior aspect of the knee, minimal tenderness upon palpation. Range of motion of the knee from 0 to 90 degrees limited by discomfort, no mechanical obstruction noted. Palpation reveals trace to 1+ effusion. Varus stress testing elicits mild pain throughout the lateral collateral ligament without laxity.  McMurray's negative bilaterally. NEUROLOGICAL: Mild discomfort upon resisted left hip flexion.  FADIR testing negative for hip or groin pain. Knee joint examination: Trace to 1+ effusion, mild tenderness distal medial hamstring tendons, nontender lateral joint line, medial joint line tender, nontender patellar tendon and quadriceps tendon, trace palpable cyst, minimally tender posterior knee, range of motion 0 to 90 degrees limited by discomfort, no mechanical obstruction, no laxity in LCL (05/07/2023)  Assessment and Plan  Left knee arthralgia with LCL injury Patient with a history of left knee fracture 30 years ago unspecified, presented with a recent injury to the same knee after stepping in a hole and twisting it. Examination revealed mild swelling, tenderness along the medial joint line and distal medial hamstring tendons, and a trace palpable cyst. Range of motion was limited by discomfort. No mechanical obstruction noted. The patient has  been experiencing pain with weight-bearing activities and has a trace to 1+ effusion. The pain is likely due to inflammation from the sprain and underlying arthritis. -Advise to wear a hinged knee brace for stabilization while ambulatory for the next two weeks. The brace can be removed during prolonged periods of sitting, sleeping, and bathing. -Prescribe Voltaren gel 1% to be applied four times daily to the knee for one week. If effective, wean from brace and medication. If ineffective, consider starting Celebrex pill daily for one week or contact for cortisone consideration. -Check-in after one week to assess the effectiveness of the treatment and adjust the plan as necessary. If the pain persists or worsens, consider cortisone injection.      Relevant Medications   celecoxib (CELEBREX) 50 MG capsule   I provided a total time of 30 minutes including both face-to-face and non-face-to-face time on 05/07/2023 inclusive of time utilized for medical chart review, information gathering, care coordination with staff, and documentation completion.   Orders & Medications Medications:  Meds ordered this encounter  Medications   celecoxib (CELEBREX) 50 MG capsule    Sig: Take 1 capsule (50 mg total) by mouth daily. X 7 days then daily PRN    Dispense:  14 capsule    Refill:  0   No orders of the defined types were placed in this encounter.    No follow-ups on file.     Jerrol Banana, MD, [facility]   Primary Care Sports Medicine Primary Care and Sports Medicine at [facility]

## 2023-05-08 ENCOUNTER — Other Ambulatory Visit: Payer: Self-pay | Admitting: Family Medicine

## 2023-05-08 DIAGNOSIS — E782 Mixed hyperlipidemia: Secondary | ICD-10-CM

## 2023-05-21 DIAGNOSIS — H1132 Conjunctival hemorrhage, left eye: Secondary | ICD-10-CM | POA: Diagnosis not present

## 2023-06-04 ENCOUNTER — Ambulatory Visit (INDEPENDENT_AMBULATORY_CARE_PROVIDER_SITE_OTHER): Admitting: Family Medicine

## 2023-06-04 ENCOUNTER — Encounter: Payer: Self-pay | Admitting: Family Medicine

## 2023-06-04 ENCOUNTER — Other Ambulatory Visit (INDEPENDENT_AMBULATORY_CARE_PROVIDER_SITE_OTHER): Payer: Self-pay | Admitting: Radiology

## 2023-06-04 VITALS — BP 150/92 | HR 91 | Ht 70.0 in | Wt 153.4 lb

## 2023-06-04 DIAGNOSIS — M25562 Pain in left knee: Secondary | ICD-10-CM | POA: Diagnosis not present

## 2023-06-04 MED ORDER — TRIAMCINOLONE ACETONIDE 40 MG/ML IJ SUSP
40.0000 mg | Freq: Once | INTRAMUSCULAR | Status: AC
  Administered 2023-06-04: 40 mg via INTRAMUSCULAR

## 2023-06-04 NOTE — Patient Instructions (Signed)
 You have just been given a cortisone injection to reduce pain and inflammation. After the injection you may notice immediate relief of pain as a result of the Lidocaine. It is important to rest the area of the injection for 24 to 48 hours after the injection. There is a possibility of some temporary increased discomfort and swelling for up to 72 hours until the cortisone begins to work. If you do have pain, simply rest the joint and use ice. If you can tolerate over the counter medications, you can try Tylenol, Aleve, or Advil for added relief per package instructions.  Patient Care Plan  Knee Pain Management  1. Receive a cortisone injection in your left knee today. 2. Limit activity for the next two days to simple daily activities. 3. Use a knee brace for two days to prevent twisting or torsion. 4. Ice your knee for 20 minutes today and before bedtime. 5. Follow the home exercise program provided to help stabilize your knee. 6. Contact the office if there is no improvement in two weeks to discuss the possibility of an MRI. 7. Consider a repeat cortisone injection in three months if pain returns.  Please follow these steps and reach out if you have any questions or concerns regarding your treatment plan.

## 2023-06-04 NOTE — Assessment & Plan Note (Signed)
 History of Present Illness Stuart Jenkins is a 83 year old who presents for follow-up to persistent left knee pain.  He experiences persistent pain in the outer left part of his knee, which has not improved since the last visit. The pain is more pronounced when twisting the knee, and he occasionally notes a painful popping sound. He notes that the knee feels unstable, and he has to be cautious with his movements to avoid further discomfort.  He was previously advised ice to utilize a hinged knee brace, Voltaren topical gel, and Celebrex, but these have not provided significant relief. He discontinued the use of the knee brace after two weeks due to its ineffectiveness in alleviating pain or providing stability.  He reports pain at the lateral patellar facet and minimal tenderness at the medial joint line. No pain during resisted hip flexion on the left and no significant pain along the medial joint line during examination maneuvers.  Physical Exam INSPECTION: Well-healed longitudinal scar at the lateral knee. PALPATION: Tenderness at the lateral patellar facet. Medial patellar facet non-tender. Minimal to trace tenderness at the medial joint line. Lateral joint non-tender. Trace to 1+ effusion. RANGE OF MOTION: Knee range of motion 0-125 degrees, limited by pain at terminal flexion. SPECIAL TESTS: Negative anterior and posterior drawer tests. McMurray's test localizes laterally. Negative valgus stress test. Varus stress test does not elicit lateral pain.  Assessment and Plan Knee arthralgia with focality to patellofemoral articulation and possible meniscus involvement Acute on chronic lateral knee pain with suspected lateral patellar subluxation and possible meniscus involvement. Previous treatments ineffective. Discussed cortisone injection for anti-inflammatory relief, expected within 12-72 hours, up to 10 days. Explained post-injection care and potential need for MRI if no improvement in two  weeks and beyond. Consider repeat injection in three months if pain recurs.  Can also consider viscosupplementation for any recurrence of symptomatology. - Administer cortisone injection to the left knee under ultrasound guidance today. - Advise limited activity for two days, focusing on simple activities of daily living. - Instruct to use the knee brace for two days to protect the knee from twisting or torsion. - Advise icing the knee for 20 minutes today and before bedtime. - Provide home exercise program from the [employer] to stabilize the knee. - Instruct to contact if no improvement in two weeks for possible MRI. - Discuss potential for repeat cortisone injection in three months if needed.

## 2023-06-04 NOTE — Progress Notes (Addendum)
 Primary Care / Sports Medicine Office Visit  Patient Information:  Patient ID: Stuart Jenkins, male DOB: 03/23/1941 Age: 83 y.o. MRN: [id number]   Stuart Jenkins is a pleasant 83 y.o. male presenting with the following:  Chief Complaint  Patient presents with   Knee Pain    Patient continues to have left knee pain since he stepped in a whole over amonth ago and twisted his knee. He took the celebrex as directed and he states it did not help and the brace did not help. He continues to have ain on the lateral aspect of the left knee. The pain is sharp and the knee is not stable.     Vitals:   06/04/23 0936  BP: (!) 150/92  Pulse: 91  SpO2: 97%   Vitals:   06/04/23 0936  Weight: 153 lb 6.4 oz (69.6 kg)  Height: 5\' 10"  (1.778 m)   Body mass index is 22.01 kg/m.  No results found.   Independent interpretation of notes and tests performed by another provider:   None  Procedures performed:   Procedure:  Injection of left knee under ultrasound guidance. Ultrasound guidance utilized for out of plane anterolateral approach, joint space visualized sonographically Samsung HS60 device utilized with permanent recording / reporting. Verbal informed consent obtained and verified. Skin prepped in a sterile fashion. Ethyl chloride for topical local analgesia.  Completed without difficulty and tolerated well. Medication: triamcinolone acetonide 40 mg/mL suspension for injection 1 mL total and 2 mL lidocaine 1% without epinephrine utilized for needle placement anesthetic Advised to contact for fevers/chills, erythema, induration, drainage, or persistent bleeding.   Pertinent History, Exam, Impression, and Recommendations:   Problem List Items Addressed This Visit     Arthralgia of left knee - Primary   History of Present Illness Stuart Jenkins is a 83 year old who presents for follow-up to persistent left knee pain.  He experiences persistent pain in the outer left  part of his knee, which has not improved since the last visit. The pain is more pronounced when twisting the knee, and he occasionally notes a painful popping sound. He notes that the knee feels unstable, and he has to be cautious with his movements to avoid further discomfort.  He was previously advised ice to utilize a hinged knee brace, Voltaren topical gel, and Celebrex, but these have not provided significant relief. He discontinued the use of the knee brace after two weeks due to its ineffectiveness in alleviating pain or providing stability.  He reports pain at the lateral patellar facet and minimal tenderness at the medial joint line. No pain during resisted hip flexion on the left and no significant pain along the medial joint line during examination maneuvers.  Physical Exam INSPECTION: Well-healed longitudinal scar at the lateral knee. PALPATION: Tenderness at the lateral patellar facet. Medial patellar facet non-tender. Minimal to trace tenderness at the medial joint line. Lateral joint non-tender. Trace to 1+ effusion. RANGE OF MOTION: Knee range of motion 0-125 degrees, limited by pain at terminal flexion. SPECIAL TESTS: Negative anterior and posterior drawer tests. McMurray's test localizes laterally. Negative valgus stress test. Varus stress test does not elicit lateral pain.  Assessment and Plan Knee arthralgia with focality to patellofemoral articulation and possible meniscus involvement Acute on chronic lateral knee pain with suspected lateral patellar subluxation and possible meniscus involvement. Previous treatments ineffective. Discussed cortisone injection for anti-inflammatory relief, expected within 12-72 hours, up to 10 days. Explained post-injection care and potential need  for MRI if no improvement in two weeks and beyond. Consider repeat injection in three months if pain recurs.  Can also consider viscosupplementation for any recurrence of symptomatology. - Administer  cortisone injection to the left knee under ultrasound guidance today. - Advise limited activity for two days, focusing on simple activities of daily living. - Instruct to use the knee brace for two days to protect the knee from twisting or torsion. - Advise icing the knee for 20 minutes today and before bedtime. - Provide home exercise program from the [employer] to stabilize the knee. - Instruct to contact if no improvement in two weeks for possible MRI. - Discuss potential for repeat cortisone injection in three months if needed.      Relevant Orders   [place] LIMITED JOINT SPACE STRUCTURES LOW LEFT     Orders & Medications Medications:  Meds ordered this encounter  Medications   triamcinolone acetonide (KENALOG-40) injection 40 mg   Orders Placed This Encounter  Procedures   [place] LIMITED JOINT SPACE STRUCTURES LOW LEFT     No follow-ups on file.     Stuart Banana, MD, [facility]   Primary Care Sports Medicine Primary Care and Sports Medicine at [facility]

## 2023-06-04 NOTE — Addendum Note (Signed)
 Addended by: Jerrol Banana on: 06/04/2023 12:58 PM   Modules accepted: Orders

## 2023-06-15 ENCOUNTER — Ambulatory Visit (INDEPENDENT_AMBULATORY_CARE_PROVIDER_SITE_OTHER): Payer: Medicare HMO | Admitting: Family Medicine

## 2023-06-15 ENCOUNTER — Encounter: Payer: Self-pay | Admitting: Family Medicine

## 2023-06-15 VITALS — BP 138/80 | Ht 70.0 in | Wt 153.0 lb

## 2023-06-15 DIAGNOSIS — I10 Essential (primary) hypertension: Secondary | ICD-10-CM

## 2023-06-15 DIAGNOSIS — E782 Mixed hyperlipidemia: Secondary | ICD-10-CM | POA: Diagnosis not present

## 2023-06-15 DIAGNOSIS — N401 Enlarged prostate with lower urinary tract symptoms: Secondary | ICD-10-CM | POA: Diagnosis not present

## 2023-06-15 MED ORDER — ATORVASTATIN CALCIUM 40 MG PO TABS: 40.0000 mg | ORAL_TABLET | Freq: Every day | ORAL | 1 refills | Status: DC

## 2023-06-15 MED ORDER — TAMSULOSIN HCL 0.4 MG PO CAPS: 0.4000 mg | ORAL_CAPSULE | Freq: Every day | ORAL | 1 refills | Status: DC

## 2023-06-15 MED ORDER — AMLODIPINE BESYLATE 2.5 MG PO TABS: 2.5000 mg | ORAL_TABLET | Freq: Every day | ORAL | 1 refills | Status: DC

## 2023-06-15 MED ORDER — METOPROLOL SUCCINATE ER 25 MG PO TB24: 12.5000 mg | ORAL_TABLET | Freq: Every day | ORAL | 1 refills | Status: AC

## 2023-06-15 NOTE — Progress Notes (Signed)
 Date:  06/15/2023   Name:  Stuart Jenkins   DOB:  March 06, 1941   MRN:  [id number]   Chief Complaint: No chief complaint on file.  Hypertension This is a chronic problem. The current episode started more than 1 year ago. The problem has been gradually improving since onset. The problem is controlled. Pertinent negatives include no blurred vision, chest pain, headaches, malaise/fatigue, orthopnea, palpitations, peripheral edema, PND or shortness of breath. There are no associated agents to hypertension. Risk factors for coronary artery disease include dyslipidemia. Past treatments include beta blockers and calcium channel blockers. The current treatment provides moderate improvement. There are no compliance problems.  There is no history of CAD/MI or CVA. There is no history of chronic renal disease, a hypertension causing med or renovascular disease.  Hyperlipidemia This is a chronic problem. The current episode started more than 1 year ago. The problem is controlled. Recent lipid tests were reviewed and are normal. He has no history of chronic renal disease. Pertinent negatives include no chest pain or shortness of breath. Current antihyperlipidemic treatment includes statins. The current treatment provides moderate improvement of lipids. There are no compliance problems.   Benign Prostatic Hypertrophy This is a chronic problem. The current episode started more than 1 year ago. The problem has been gradually improving since onset. Irritative symptoms include nocturia. Irritative symptoms do not include frequency or urgency. Obstructive symptoms include incomplete emptying. Obstructive symptoms do not include dribbling, an intermittent stream, a slower stream, straining or a weak stream. Pertinent negatives include no hematuria or nausea. Past treatments include tamsulosin. The treatment provided mild relief.    Lab Results  Component Value Date   NA 139 11/26/2021   K 4.2 11/26/2021   CO2 22  11/26/2021   GLUCOSE 96 11/26/2021   BUN 12 11/26/2021   CREATININE 1.06 11/26/2021   CALCIUM 9.7 11/26/2021   EGFR 71 11/26/2021   GFRNONAA 69 12/20/2019   Lab Results  Component Value Date   CHOL 108 11/26/2021   HDL 39 (L) 11/26/2021   LDLCALC 43 11/26/2021   TRIG 155 (H) 11/26/2021   CHOLHDL 3.1 01/31/2019   No results found for: "TSH" Lab Results  Component Value Date   HGBA1C 5.5 12/24/2014   Lab Results  Component Value Date   WBC 4.3 12/23/2014   HGB 9.9 (L) 12/24/2014   HCT 31.7 (L) 12/23/2014   MCV 90.5 12/23/2014   PLT 384 12/23/2014   Lab Results  Component Value Date   ALT 47 (H) 02/05/2022   AST 42 (H) 02/05/2022   ALKPHOS 111 02/05/2022   BILITOT 0.6 02/05/2022   No results found for: "25OHVITD2", "25OHVITD3", "VD25OH"   Review of Systems  Constitutional:  Negative for malaise/fatigue.  Eyes:  Negative for blurred vision and visual disturbance.  Respiratory:  Negative for chest tightness, shortness of breath and wheezing.   Cardiovascular:  Negative for chest pain, palpitations, orthopnea and PND.  Gastrointestinal:  Negative for abdominal distention, blood in stool, constipation, diarrhea and nausea.  Genitourinary:  Positive for incomplete emptying and nocturia. Negative for frequency, hematuria and urgency.  Neurological:  Negative for headaches.    Patient Active Problem List   Diagnosis Date Noted   Arthralgia of left knee 05/07/2023   Infected abrasion of skin of right forearm 10/22/2021   Pain in limb 02/12/2018   Coronary artery disease involving coronary bypass graft of native heart without angina pectoris 07/30/2017   Mixed hyperlipidemia 09/11/2016   Benign prostatic  hyperplasia with lower urinary tract symptoms 09/11/2016   Hematospermia 07/10/2015   Essential hypertension 06/12/2015   Cervical disc disorder 04/10/2015   Hypotension 12/24/2014   Hyponatremia 12/24/2014   Anemia 12/24/2014   Hyperglycemia 12/24/2014   Syncopal  episodes 12/23/2014   Acute postoperative pain 12/07/2014   Cigarette smoker 12/06/2014   Primary localized osteoarthrosis, hand 11/17/2013   Chronic prostatitis 03/09/2012    Allergies  Allergen Reactions   Oxycodone Hcl Other (See Comments)    Altered mental status   Sulfa Antibiotics Rash    Past Surgical History:  Procedure Laterality Date   APPENDECTOMY     CARDIAC CATHETERIZATION N/A 11/08/2014   Procedure: Left Heart Cath and Coronary Angiography;  Surgeon: Alwyn Pea, MD;  Location: [facility];  Service: Cardiovascular;  Laterality: N/A;   CARDIOVERSION     CATARACT EXTRACTION W/PHACO Right 06/01/2018   Procedure: CATARACT EXTRACTION PHACO AND INTRAOCULAR LENS PLACEMENT (IOC) RIGHT;  Surgeon: Galen Manila, MD;  Location: [facility] ORS;  Service: Ophthalmology;  Laterality: Right;  [place] 00:50.5 CDE 7.01 Fluid Pack Lot # [id number]   CATARACT EXTRACTION W/PHACO Left 08/10/2018   Procedure: CATARACT EXTRACTION PHACO AND INTRAOCULAR LENS PLACEMENT (IOC)  COMPLICATED LEFT MALYUGIN;  Surgeon: Galen Manila, MD;  Location: [facility];  Service: Ophthalmology;  Laterality: Left;  MALYUGIN   COLONOSCOPY     COLONOSCOPY WITH PROPOFOL N/A 02/23/2017   Procedure: COLONOSCOPY WITH PROPOFOL;  Surgeon: Christena Deem, MD;  Location: [facility] ENDOSCOPY;  Service: Endoscopy;  Laterality: N/A;   CORONARY ANGIOPLASTY     CORONARY ARTERY BYPASS GRAFT     ELECTROPHYSIOLOGIC STUDY N/A 01/30/2015   Procedure: Cardioversion;  Surgeon: Alwyn Pea, MD;  Location: [facility] ORS;  Service: Cardiovascular;  Laterality: N/A;   FRACTURE SURGERY Left 1995   arm, knee, collar bone (presumed tibia)   FRACTURE SURGERY     GASTRIC RESECTION     partial   open heart surgery  12/05/2014    Social History   Tobacco Use   Smoking status: Former    Current packs/day: 0.00    Types: Cigarettes    Quit date: 11/07/1984    Years since quitting: 38.6   Smokeless tobacco: Never   Vaping Use   Vaping status: Never Used  Substance Use Topics   Alcohol use: Not Currently    Comment: less than 1 drink/week   Drug use: No     Medication list has been reviewed and updated.  Current Meds  Medication Sig   aspirin EC 81 MG tablet Take 1 tablet (81 mg total) by mouth daily.   tamsulosin (FLOMAX) 0.4 MG CAPS capsule Take 1 capsule (0.4 mg total) by mouth daily.   [DISCONTINUED] amLODipine (NORVASC) 2.5 MG tablet Take 2.5 mg by mouth daily.   [DISCONTINUED] atorvastatin (LIPITOR) 40 MG tablet TAKE 1 TABLET BY MOUTH EVERY DAY   [DISCONTINUED] metoprolol succinate (TOPROL-XL) 25 MG 24 hr tablet Take 0.5 tablets (12.5 mg total) by mouth daily. One a day       06/15/2023    2:03 PM 06/04/2023    9:42 AM 12/15/2022    2:42 PM 05/27/2022    7:54 AM  GAD 7 : Generalized Anxiety Score  Nervous, Anxious, on Edge 0 0 0 0  Control/stop worrying 0 0 0 0  Worry too much - different things 0 0 0 0  Trouble relaxing 0 0 0 0  Restless 0 0 0 0  Easily annoyed or irritable 0  0 0 0  Afraid - awful might happen 0 0 0 0  Total GAD 7 Score 0 0 0 0  Anxiety Difficulty  Not difficult at all Not difficult at all Not difficult at all       06/15/2023    2:02 PM 06/04/2023    9:41 AM 12/15/2022    2:42 PM  Depression screen PHQ 2/9  Decreased Interest 0 0 0  Down, Depressed, Hopeless 0 0 0  PHQ - 2 Score 0 0 0  Altered sleeping 0 0 0  Tired, decreased energy 0 0 0  Change in appetite 0 0 0  Feeling bad or failure about yourself  0 0 0  Trouble concentrating 0 0 0  Moving slowly or fidgety/restless 0 0 0  Suicidal thoughts 0 0 0  PHQ-9 Score 0 0 0  Difficult doing work/chores  Not difficult at all Not difficult at all    BP Readings from Last 3 Encounters:  06/15/23 138/80  06/04/23 (!) 150/92  05/07/23 (!) 140/90    Physical Exam Vitals and nursing note reviewed.  Constitutional:      Appearance: He is well-developed.  HENT:     Head: Normocephalic and atraumatic.      Right Ear: Tympanic membrane, ear canal and external ear normal.     Left Ear: Tympanic membrane, ear canal and external ear normal.     Nose: Nose normal. No congestion or rhinorrhea.     Mouth/Throat:     Mouth: Mucous membranes are moist.     Dentition: Normal dentition.  Eyes:     General: Lids are normal. No scleral icterus.    Conjunctiva/sclera: Conjunctivae normal.     Pupils: Pupils are equal, round, and reactive to light.  Neck:     Thyroid: No thyromegaly.     Vascular: No carotid bruit, hepatojugular reflux or JVD.     Trachea: No tracheal deviation.  Cardiovascular:     Rate and Rhythm: Normal rate and regular rhythm.     Heart sounds: Normal heart sounds.  Pulmonary:     Effort: Pulmonary effort is normal.     Breath sounds: Normal breath sounds. No wheezing, rhonchi or rales.  Abdominal:     General: Bowel sounds are normal.     Palpations: Abdomen is soft. There is no hepatomegaly, splenomegaly or mass.     Tenderness: There is no abdominal tenderness.     Hernia: There is no hernia in the left inguinal area.  Genitourinary:    Prostate: Normal. Not enlarged, not tender and no nodules present.     Rectum: Normal. Guaiac result negative. No mass.  Musculoskeletal:        General: Normal range of motion.     Cervical back: Normal range of motion and neck supple.  Lymphadenopathy:     Cervical: No cervical adenopathy.  Skin:    General: Skin is warm and dry.     Findings: No rash.  Neurological:     Mental Status: He is alert and oriented to person, place, and time.     Sensory: No sensory deficit.     Deep Tendon Reflexes: Reflexes are normal and symmetric.  Psychiatric:        Mood and Affect: Mood is not anxious or depressed.     Wt Readings from Last 3 Encounters:  06/15/23 153 lb (69.4 kg)  06/04/23 153 lb 6.4 oz (69.6 kg)  05/07/23 155 lb 9.6 oz (70.6 kg)  BP 138/80   Ht 5\' 10"  (1.778 m)   Wt 153 lb (69.4 kg)   BMI 21.95 kg/m    Assessment and Plan: 1. Essential hypertension (Primary) Chronic.  Controlled.  Stable.  Blood pressure 138/80.  Asymptomatic.  Tolerating medications well.  Continue metoprolol XL 25 mg 1/2 tablet once a day and amlodipine 2.5 mg once a day.  Will check CMP for electrolytes and GFR. - metoprolol succinate (TOPROL-XL) 25 MG 24 hr tablet; Take 0.5 tablets (12.5 mg total) by mouth daily. One a day  Dispense: 90 tablet; Refill: 1 - amLODipine (NORVASC) 2.5 MG tablet; Take 1 tablet (2.5 mg total) by mouth daily.  Dispense: 90 tablet; Refill: 1 - Comprehensive metabolic panel  2. Mixed hyperlipidemia Chronic.  Controlled.  Stable.  Asymptomatic.  Without myalgias or muscle weakness.  Continue atorvastatin 40 mg once a day.  Will check lipid panel for LDL control and CMP for hepatic concerns. - atorvastatin (LIPITOR) 40 MG tablet; Take 1 tablet (40 mg total) by mouth daily.  Dispense: 90 tablet; Refill: 1 - Lipid Panel With LDL/HDL Ratio - Comprehensive metabolic panel  3. Benign prostatic hyperplasia with lower urinary tract symptoms, symptom details unspecified Chronic.  Controlled.  Stable.  Patient has improved symptomatology on tamsulosin 0.4 mg nightly.  Will continue at current dosing.  DRE was normal with out prostatic hypertrophy enlargement nor change in consistency or nodularity.  Will check PSA for current level of control. - tamsulosin (FLOMAX) 0.4 MG CAPS capsule; Take 1 capsule (0.4 mg total) by mouth daily.  Dispense: 90 capsule; Refill: 1 - PSA     Elizabeth Sauer, MD

## 2023-06-17 LAB — COMPREHENSIVE METABOLIC PANEL
ALT: 49 IU/L — ABNORMAL HIGH (ref 0–44)
AST: 34 IU/L (ref 0–40)
Albumin: 4.1 g/dL (ref 3.7–4.7)
Alkaline Phosphatase: 118 IU/L (ref 44–121)
BUN/Creatinine Ratio: 12 (ref 10–24)
BUN: 12 mg/dL (ref 8–27)
Bilirubin Total: 0.5 mg/dL (ref 0.0–1.2)
CO2: 24 mmol/L (ref 20–29)
Calcium: 9.2 mg/dL (ref 8.6–10.2)
Chloride: 102 mmol/L (ref 96–106)
Creatinine, Ser: 0.97 mg/dL (ref 0.76–1.27)
Globulin, Total: 2.1 g/dL (ref 1.5–4.5)
Glucose: 94 mg/dL (ref 70–99)
Potassium: 5 mmol/L (ref 3.5–5.2)
Sodium: 138 mmol/L (ref 134–144)
Total Protein: 6.2 g/dL (ref 6.0–8.5)
eGFR: 78 mL/min/{1.73_m2} (ref >59)

## 2023-06-17 LAB — LIPID PANEL WITH LDL/HDL RATIO
Cholesterol, Total: 91 mg/dL — ABNORMAL LOW (ref 100–199)
HDL: 35 mg/dL — ABNORMAL LOW (ref >39)
LDL Chol Calc (NIH): 40 mg/dL (ref 0–99)
LDL/HDL Ratio: 1.1 ratio (ref 0.0–3.6)
Triglycerides: 78 mg/dL (ref 0–149)
VLDL Cholesterol Cal: 16 mg/dL (ref 5–40)

## 2023-06-17 LAB — PSA: Prostate Specific Ag, Serum: 1.2 ng/mL (ref 0.0–4.0)

## 2023-06-17 NOTE — Progress Notes (Signed)
 No answer to phone call. Voicemail message left to call back.

## 2023-07-16 DIAGNOSIS — H43813 Vitreous degeneration, bilateral: Secondary | ICD-10-CM | POA: Diagnosis not present

## 2023-07-16 DIAGNOSIS — Z961 Presence of intraocular lens: Secondary | ICD-10-CM | POA: Diagnosis not present

## 2023-07-16 DIAGNOSIS — D3132 Benign neoplasm of left choroid: Secondary | ICD-10-CM | POA: Diagnosis not present

## 2023-07-16 DIAGNOSIS — H1132 Conjunctival hemorrhage, left eye: Secondary | ICD-10-CM | POA: Diagnosis not present

## 2023-07-28 DIAGNOSIS — L821 Other seborrheic keratosis: Secondary | ICD-10-CM | POA: Diagnosis not present

## 2023-07-28 DIAGNOSIS — L570 Actinic keratosis: Secondary | ICD-10-CM | POA: Diagnosis not present

## 2023-10-12 NOTE — Progress Notes (Signed)
 [facility] Quality Team Note  Name: Stuart Jenkins Date of Birth: August 26, 1940 MRN: [id number] Date: 10/12/2023  [facility] Quality Team has reviewed this patient's chart, please see recommendations below:  [facility] Quality Other; (CHART REVIEWED FOR CBP, ABSTRACTED FOR GAP CLOSURE.)

## 2023-11-09 ENCOUNTER — Other Ambulatory Visit
Admission: RE | Admit: 2023-11-09 | Discharge: 2023-11-09 | Disposition: A | Discharge status: Home / Self Care | Source: Home / Self Care | Attending: Family Medicine | Admitting: Family Medicine

## 2023-11-09 ENCOUNTER — Encounter: Payer: Self-pay | Admitting: Family Medicine

## 2023-11-09 ENCOUNTER — Ambulatory Visit (INDEPENDENT_AMBULATORY_CARE_PROVIDER_SITE_OTHER): Admitting: Family Medicine

## 2023-11-09 ENCOUNTER — Ambulatory Visit
Admission: RE | Admit: 2023-11-09 | Discharge: 2023-11-09 | Disposition: A | Discharge status: Home / Self Care | Source: Ambulatory Visit | Attending: Family Medicine | Admitting: Family Medicine

## 2023-11-09 VITALS — BP 138/82 | HR 73 | Ht 70.0 in | Wt 153.0 lb

## 2023-11-09 DIAGNOSIS — M2392 Unspecified internal derangement of left knee: Secondary | ICD-10-CM | POA: Diagnosis not present

## 2023-11-09 DIAGNOSIS — M25562 Pain in left knee: Secondary | ICD-10-CM | POA: Diagnosis not present

## 2023-11-09 DIAGNOSIS — M85862 Other specified disorders of bone density and structure, left lower leg: Secondary | ICD-10-CM | POA: Diagnosis not present

## 2023-11-11 ENCOUNTER — Encounter: Payer: Self-pay | Admitting: Family Medicine

## 2023-11-11 ENCOUNTER — Ambulatory Visit (INDEPENDENT_AMBULATORY_CARE_PROVIDER_SITE_OTHER): Admitting: Family Medicine

## 2023-11-11 VITALS — BP 132/88 | HR 64 | Ht 70.0 in | Wt 153.5 lb

## 2023-11-11 DIAGNOSIS — E782 Mixed hyperlipidemia: Secondary | ICD-10-CM

## 2023-11-11 DIAGNOSIS — I10 Essential (primary) hypertension: Secondary | ICD-10-CM | POA: Diagnosis not present

## 2023-11-11 DIAGNOSIS — N401 Enlarged prostate with lower urinary tract symptoms: Secondary | ICD-10-CM | POA: Diagnosis not present

## 2023-11-11 DIAGNOSIS — I25810 Atherosclerosis of coronary artery bypass graft(s) without angina pectoris: Secondary | ICD-10-CM | POA: Diagnosis not present

## 2023-11-11 DIAGNOSIS — M25562 Pain in left knee: Secondary | ICD-10-CM | POA: Diagnosis not present

## 2023-11-11 NOTE — Assessment & Plan Note (Signed)
 History of Present Illness Stuart Jenkins is an 83 year old male who presents with persistent left knee pain following a fall.  Left knee pain and mechanical symptoms - Persistent left knee pain since a fall into a ditch in February (5 months duration) - Pain began after twisting injury to the knee - Cortisone injection in March provided minimal relief - Pain limits ambulation; unable to walk far without knee pain - Popping sensation in the knee, palpable when placing a hand on it - Occasional sensation of the knee giving way, but not frequent - No regular episodes of instability - No use of pain medication  Supportive devices and prior interventions - Uses a Copperfit brace daily, which provides some stability - Previous knee brace did not improve pain or stability - History of pin placement in the knee approximately thirty years ago; concerned about possible impact from recent twisting injury  Physical Exam LEFT KNEE INSPECTION: Mild baseline valgus alignment; no erythema or overt swelling PALPATION: Trace suprapatellar effusion; tenderness at lateral patellar facet; medial patellar facet nontender; medial and lateral joint lines nontender; quadriceps and patellar tendons nontender RANGE OF MOTION: 0-125 degrees, limited by painful flexion without mechanical obstruction STRENGTH: Quadriceps and hamstrings 5/5, limited effort secondary to discomfort NEUROLOGICAL: Sensation intact in lower extremity, no motor deficits SPECIAL TESTS: Anterior and posterior drawer tests negative; McMurray localizes laterally; valgus and varus stress testing negative  Assessment and Plan Left knee pain with osteoarthritis and suspected meniscus involvement Chronic left knee pain with osteoarthritis and suspected meniscus tear, exacerbated by fall. Minimal relief from cortisone injection suggests possible surgical intervention. Positive McMurray test indicates potential meniscus involvement. Differential  includes degenerative versus complex tear. - Order MRI and x-ray of left knee to assess meniscus involvement, arthritis severity, and hardware status. - Use Copperfit brace for stability. - Apply ice for 20 minutes for inflammation control. - Use Tylenol  for pain management. - Consider topical Voltaren for anti-inflammatory effect. - Discuss potential surgical referral to orthopedic surgeons if MRI shows complex meniscus tear or severe arthritis.  History of left knee hardware (pin) placement Concerns about potential pin displacement or damage after knee twisting incident. - Order x-ray of left knee to assess hardware status.

## 2023-11-11 NOTE — Assessment & Plan Note (Signed)
 Okay to continue Tylenol .  Follow-up with Dr. Alvia.

## 2023-11-11 NOTE — Assessment & Plan Note (Signed)
 Stable with Flomax .  Recent PSA levels within normal range.

## 2023-11-11 NOTE — Progress Notes (Signed)
 Primary Care / Sports Medicine Office Visit  Patient Information:  Patient ID: Stuart Jenkins, male DOB: [birth year] Age: 83 y.o. MRN: [id number]   Stuart Jenkins is a pleasant 83 y.o. male presenting with the following:  Chief Complaint  Patient presents with   Knee Pain    Left knee continues. Patient had a cortisone injection in March but states it really did not help. He tried home exercise program which did not help. He is here today to discuss getting a MRI or further treatment. He had surgery on that knee in the past. He has a pin in his knee that he says he can feel pop occasionally when bending his knee.     Vitals:   11/09/23 1359 11/09/23 1412  BP: (!) 140/90 138/82  Pulse: 73   SpO2: 98%    Vitals:   11/09/23 1359  Weight: 153 lb (69.4 kg)  Height: 5' 10 (1.778 m)   Body mass index is 21.95 kg/m.  No results found.   Independent interpretation of notes and tests performed by another provider:   None  Procedures performed:   None  Pertinent History, Exam, Impression, and Recommendations:   Problem List Items Addressed This Visit     Arthralgia of left knee   History of Present Illness Stuart Jenkins is an 83 year old male who presents with persistent left knee pain following a fall.  Left knee pain and mechanical symptoms - Persistent left knee pain since a fall into a ditch in February (5 months duration) - Pain began after twisting injury to the knee - Cortisone injection in March provided minimal relief - Pain limits ambulation; unable to walk far without knee pain - Popping sensation in the knee, palpable when placing a hand on it - Occasional sensation of the knee giving way, but not frequent - No regular episodes of instability - No use of pain medication  Supportive devices and prior interventions - Uses a Copperfit brace daily, which provides some stability - Previous knee brace did not improve pain or stability - History  of pin placement in the knee approximately thirty years ago; concerned about possible impact from recent twisting injury  Physical Exam LEFT KNEE INSPECTION: Mild baseline valgus alignment; no erythema or overt swelling PALPATION: Trace suprapatellar effusion; tenderness at lateral patellar facet; medial patellar facet nontender; medial and lateral joint lines nontender; quadriceps and patellar tendons nontender RANGE OF MOTION: 0-125 degrees, limited by painful flexion without mechanical obstruction STRENGTH: Quadriceps and hamstrings 5/5, limited effort secondary to discomfort NEUROLOGICAL: Sensation intact in lower extremity, no motor deficits SPECIAL TESTS: Anterior and posterior drawer tests negative; McMurray localizes laterally; valgus and varus stress testing negative  Assessment and Plan Left knee pain with osteoarthritis and suspected meniscus involvement Chronic left knee pain with osteoarthritis and suspected meniscus tear, exacerbated by fall. Minimal relief from cortisone injection suggests possible surgical intervention. Positive McMurray test indicates potential meniscus involvement. Differential includes degenerative versus complex tear. - Order MRI and x-ray of left knee to assess meniscus involvement, arthritis severity, and hardware status. - Use Copperfit brace for stability. - Apply ice for 20 minutes for inflammation control. - Use Tylenol  for pain management. - Consider topical Voltaren for anti-inflammatory effect. - Discuss potential surgical referral to orthopedic surgeons if MRI shows complex meniscus tear or severe arthritis.  History of left knee hardware (pin) placement Concerns about potential pin displacement or damage after knee twisting incident. - Order x-ray of  left knee to assess hardware status.      Other Visit Diagnoses       Internal derangement of left knee    -  Primary   Relevant Orders   DG Knee Complete 4 Views Left   MR Knee Left  Wo  Contrast        Orders & Medications Medications: No orders of the defined types were placed in this encounter.  Orders Placed This Encounter  Procedures   DG Knee Complete 4 Views Left   MR Knee Left  Wo Contrast     No follow-ups on file.     Selinda JINNY Ku, MD, [facility]   Primary Care Sports Medicine Primary Care and Sports Medicine at [facility]

## 2023-11-11 NOTE — Patient Instructions (Signed)
 Patient Action Plan  1. Get an MRI and x-ray of your left knee as ordered. 2. Wear a Copperfit brace for knee stability. 3. Apply ice to your knee for 20 minutes to help reduce inflammation. 4. Use Tylenol  as needed for pain relief. 5. Consider using topical Voltaren for additional anti-inflammatory effect. 6. If the MRI shows a complex meniscus tear or severe arthritis, discuss possible referral to an orthopedic surgeon.  Red Flags: If you experience any of the following, seek medical attention right away: - Sudden or severe knee pain - Inability to move or bear weight on the knee - Significant swelling, redness, or warmth in the knee - Fever or chills  If your symptoms get worse or you have new concerns, contact the office.

## 2023-11-11 NOTE — Assessment & Plan Note (Signed)
 Blood pressure fairly well-controlled, continue current medications.  Follow-up with cardiology.  Recommend DASH diet and exercise.

## 2023-11-11 NOTE — Assessment & Plan Note (Signed)
 Follow-up with cardiology.  Continue baby aspirin .  Denies any recent episodes of melena or hematochezia.

## 2023-11-11 NOTE — Patient Instructions (Signed)
 Continue your medications.   Avoid pain pills like Aleve, Advil, Motrin, Ibuprofen due to stomach ulcer hx.   Follow up with Cardiology as scheduled.

## 2023-11-11 NOTE — Progress Notes (Signed)
 Established Patient Office Visit  Subjective   Patient ID: Stuart Jenkins, male    DOB: 1940/06/26  Age: 83 y.o. MRN: [id number]  Chief Complaint  Patient presents with   Hypertension     Assessment & Plan:   Problem List Items Addressed This Visit       Cardiovascular and Mediastinum   Essential hypertension - Primary   Blood pressure fairly well-controlled, continue current medications.  Follow-up with cardiology.  Recommend DASH diet and exercise.      Coronary artery disease involving coronary bypass graft of native heart without angina pectoris   Follow-up with cardiology.  Continue baby aspirin .  Denies any recent episodes of melena or hematochezia.        Genitourinary   Benign prostatic hyperplasia with lower urinary tract symptoms   Stable with Flomax .  Recent PSA levels within normal range.        Other   Mixed hyperlipidemia   Arthralgia of left knee   Okay to continue Tylenol .  Follow-up with Dr. Alvia.      Other Visit Diagnoses       Elevated liver function tests           Return in about 6 months (around 05/13/2024) for chronic follow up with PCP.   83 year old male with past medical history of coronary artery disease status post CABG x 3, hypertension, history of hypotension, history of peptic ulcers, history of partial gastrectomy, history of colon polyps, gout, left knee pain presents to the clinic for transfer of care his previous provider Dr. Joshua retired.  He has no concerns today.  He has a follow-up with cardiology.  He recently saw Dr. Alvia for left knee pain reports he is planning to get MRI.  He takes Tylenol  for pain.  Discussed with the patient to avoid NSAIDs.  He lives alone following the death of his wife four years ago. He maintains regular communication with his two sons who live out of state. He retired from a [employer] company in 2013 after 43 years of service. He does not smoke and has not for 35-40 years, and he  does not consume alcohol.     Discussed the use of AI scribe software for clinical note transcription with the patient, who gave verbal consent to proceed.  History of Present Illness       Review of Systems  All other systems reviewed and are negative.     Objective:     BP 132/88   Pulse 64   Ht 5' 10 (1.778 m)   Wt 153 lb 8 oz (69.6 kg)   SpO2 98%   BMI 22.02 kg/m  BP Readings from Last 3 Encounters:  11/11/23 132/88  11/09/23 138/82  06/15/23 138/80      Physical Exam Vitals and nursing note reviewed.  Constitutional:      Appearance: Normal appearance.  HENT:     Head: Normocephalic.     Right Ear: External ear normal.     Left Ear: External ear normal.  Eyes:     Conjunctiva/sclera: Conjunctivae normal.  Cardiovascular:     Rate and Rhythm: Normal rate.  Pulmonary:     Effort: Pulmonary effort is normal. No respiratory distress.  Abdominal:     Palpations: Abdomen is soft.  Musculoskeletal:        General: Normal range of motion.  Skin:    General: Skin is warm.  Neurological:     Mental Status: He is  alert and oriented to person, place, and time.  Psychiatric:        Mood and Affect: Mood normal.      No results found for any visits on 11/11/23.     The ASCVD Risk score (Arnett DK, et al., 2019) failed to calculate for the following reasons:   The 2019 ASCVD risk score is only valid for ages 23 to 66      Stuart MARLA Ny, MD

## 2023-11-17 ENCOUNTER — Ambulatory Visit
Admission: RE | Admit: 2023-11-17 | Discharge: 2023-11-17 | Disposition: A | Discharge status: Home / Self Care | Source: Ambulatory Visit | Attending: Family Medicine | Admitting: Family Medicine

## 2023-11-17 DIAGNOSIS — M2392 Unspecified internal derangement of left knee: Secondary | ICD-10-CM | POA: Diagnosis not present

## 2023-11-17 DIAGNOSIS — M2242 Chondromalacia patellae, left knee: Secondary | ICD-10-CM | POA: Diagnosis not present

## 2023-11-17 DIAGNOSIS — S83242A Other tear of medial meniscus, current injury, left knee, initial encounter: Secondary | ICD-10-CM | POA: Diagnosis not present

## 2023-11-17 DIAGNOSIS — M23242 Derangement of anterior horn of lateral meniscus due to old tear or injury, left knee: Secondary | ICD-10-CM | POA: Diagnosis not present

## 2023-11-17 DIAGNOSIS — R609 Edema, unspecified: Secondary | ICD-10-CM | POA: Diagnosis not present

## 2023-11-18 ENCOUNTER — Ambulatory Visit: Payer: Medicare HMO | Admitting: Emergency Medicine

## 2023-11-18 VITALS — Ht 70.0 in | Wt 158.0 lb

## 2023-11-18 DIAGNOSIS — Z0000 Encounter for general adult medical examination without abnormal findings: Secondary | ICD-10-CM

## 2023-11-18 NOTE — Progress Notes (Signed)
 Subjective:   Stuart Jenkins is a 83 y.o. who presents for a Medicare Wellness preventive visit.  As a reminder, Annual Wellness Visits don't include a physical exam, and some assessments may be limited, especially if this visit is performed virtually. We may recommend an in-person follow-up visit with your provider if needed.  Visit Complete: Virtual I connected with  Stuart Jenkins on 11/18/23 by a audio enabled telemedicine application and verified that I am speaking with the correct person using two identifiers.  Patient Location: Home  Provider Location: Home Office  I discussed the limitations of evaluation and management by telemedicine. The patient expressed understanding and agreed to proceed.  Vital Signs: Because this visit was a virtual/telehealth visit, some criteria may be missing or patient reported. Any vitals not documented were not able to be obtained and vitals that have been documented are patient reported.  VideoDeclined- This patient declined Librarian, academic. Therefore the visit was completed with audio only.  Persons Participating in Visit: Patient.  AWV Questionnaire: No: Patient Medicare AWV questionnaire was not completed prior to this visit.  Cardiac Risk Factors include: advanced age (>5men, >45 women);male gender;dyslipidemia;hypertension;Other (see comment), Risk factor comments: CAD     Objective:    Today's Vitals   11/18/23 1010  Weight: 158 lb (71.7 kg)  Height: 5' 10 (1.778 m)   Body mass index is 22.67 kg/m.     11/18/2023   10:20 AM 11/12/2022   10:00 AM 01/02/2022    8:07 PM 11/05/2021    2:13 PM 07/30/2020   11:27 AM 07/27/2019   11:28 AM 08/10/2018    9:46 AM  Advanced Directives  Does Patient Have a Medical Advance Directive? Yes No No No No No No  Type of Estate agent of [place];Living will        Does patient want to make changes to medical advance directive? No -  Patient declined        Copy of Healthcare Power of Attorney in Chart? No - copy requested        Would patient like information on creating a medical advance directive?  No - Patient declined No - Patient declined Yes (MAU/Ambulatory/Procedural Areas - Information given) Yes (MAU/Ambulatory/Procedural Areas - Information given) Yes (MAU/Ambulatory/Procedural Areas - Information given) No - Patient declined      Data saved with a previous flowsheet row definition    Current Medications (verified) Outpatient Encounter Medications as of 11/18/2023  Medication Sig   amLODipine  (NORVASC ) 2.5 MG tablet Take 1 tablet (2.5 mg total) by mouth daily.   aspirin  EC 81 MG tablet Take 1 tablet (81 mg total) by mouth daily.   atorvastatin  (LIPITOR) 40 MG tablet Take 1 tablet (40 mg total) by mouth daily.   metoprolol  succinate (TOPROL -XL) 25 MG 24 hr tablet Take 0.5 tablets (12.5 mg total) by mouth daily. One a day   tamsulosin  (FLOMAX ) 0.4 MG CAPS capsule Take 1 capsule (0.4 mg total) by mouth daily.   No facility-administered encounter medications on file as of 11/18/2023.    Allergies (verified) Oxycodone hcl and Sulfa antibiotics   History: Past Medical History:  Diagnosis Date   Atrial fibrillation (HCC)    BPH (benign prostatic hypertrophy)    Colon polyp    Coronary artery disease    Dysrhythmia    AFIB POST CABG / CARDIOVERSION   Gastric bleed    SURGERY   Gout    Hyperlipidemia  Hypertension    Peptic ulcer disease    Past Surgical History:  Procedure Laterality Date   APPENDECTOMY     CARDIAC CATHETERIZATION N/A 11/08/2014   Procedure: Left Heart Cath and Coronary Angiography;  Surgeon: Cara JONETTA Lovelace, MD;  Location: [facility];  Service: Cardiovascular;  Laterality: N/A;   CARDIOVERSION     CATARACT EXTRACTION W/PHACO Right 06/01/2018   Procedure: CATARACT EXTRACTION PHACO AND INTRAOCULAR LENS PLACEMENT (IOC) RIGHT;  Surgeon: Jaye Fallow, MD;  Location:  [facility] OR[facility];  Service: Ophthalmology;  Laterality: Right;  US  00:50.5 CDE 7.01 Fluid Pack Lot # [id number]   CATARACT EXTRACTION W/PHACO Left 08/10/2018   Procedure: CATARACT EXTRACTION PHACO AND INTRAOCULAR LENS PLACEMENT (IOC)  COMPLICATED LEFT MALYUGIN;  Surgeon: Jaye Fallow, MD;  Location: [facility];  Service: Ophthalmology;  Laterality: Left;  MALYUGIN   COLONOSCOPY     COLONOSCOPY WITH PROPOFOL  N/A 02/23/2017   Procedure: COLONOSCOPY WITH PROPOFOL ;  Surgeon: Gaylyn Gladis PENNER, MD;  Location: [facility];  Service: Endoscopy;  Laterality: N/A;   CORONARY ANGIOPLASTY     CORONARY ARTERY BYPASS GRAFT     ELECTROPHYSIOLOGIC STUDY N/A 01/30/2015   Procedure: Cardioversion;  Surgeon: Cara JONETTA Lovelace, MD;  Location: [facility];  Service: Cardiovascular;  Laterality: N/A;   FRACTURE SURGERY Left 1995   arm, knee, collar bone (presumed tibia)   FRACTURE SURGERY     GASTRIC RESECTION     partial   open heart surgery  12/05/2014   Family History  Problem Relation Age of [place]   Hypertension Mother    Hyperlipidemia Mother    Heart disease Mother    Hypertension Father    Hyperlipidemia Father    Heart disease Father    Hypertension Brother    Social History   Socioeconomic History   Marital status: Widowed    Spouse name: Not on file   Number of children: 2   Years of education: Not on file   Highest education level: Not on file  Occupational History   Occupation: retired    Start: 2013  Tobacco Use   Smoking status: Former    Current packs/day: 0.00    Average packs/day: 0.3 packs/day for 27.6 years (6.9 ttl pk-yrs)    Types: Cigarettes    Start date: 73    Quit date: 11/07/1984    Years since quitting: 39.0   Smokeless tobacco: Never  Vaping Use   Vaping status: Never Used  Substance and Sexual Activity   Alcohol use: Not Currently    Comment: less than 1 drink/week, last use 2020   Drug use: No   Sexual activity: Yes  Other Topics Concern   Not  on file  Social History Narrative   Pt lives alone.    Social Drivers of Corporate investment banker Strain: Low Risk  (11/18/2023)   Overall Financial Resource Strain (CARDIA)    Difficulty of Paying Living Expenses: Not hard at all  Food Insecurity: No Food Insecurity (11/18/2023)   Hunger Vital Sign    Worried About Running Out of Food in the Last Year: Never true    Ran Out of Food in the Last Year: Never true  Transportation Needs: No Transportation Needs (11/18/2023)   PRAPARE - Administrator, Civil Service (Medical): No    Lack of Transportation (Non-Medical): No  Physical Activity: Inactive (11/18/2023)   Exercise Vital Sign    Days of Exercise per Week: 0 days    Minutes  of Exercise per Session: 0 min  Stress: No Stress Concern Present (11/18/2023)   [employer] of Occupational Health - Occupational Stress Questionnaire    Feeling of Stress: Not at all  Social Connections: Socially Isolated (11/18/2023)   Social Connection and Isolation Panel    Frequency of Communication with Friends and Family: More than three times a week    Frequency of Social Gatherings with Friends and Family: More than three times a week    Attends Religious Services: Never    Database administrator or Organizations: No    Attends Banker Meetings: Never    Marital Status: Widowed    Tobacco Counseling Counseling given: Not Answered    Clinical Intake:  Pre-visit preparation completed: Yes  Pain : No/denies pain     BMI - recorded: 22.67 Nutritional Status: BMI of 19-24  Normal Nutritional Risks: None Diabetes: No  Lab Results  Component Value Date   HGBA1C 5.5 12/24/2014     How often do you need to have someone help you when you read instructions, pamphlets, or other written materials from your doctor or pharmacy?: 1 - Never  Interpreter Needed?: No  Information entered by :: Vina Ned, CMA   Activities of Daily Living     11/18/2023    10:12 AM  In your present state of health, do you have any difficulty performing the following activities:  Hearing? 0  Vision? 0  Difficulty concentrating or making decisions? 0  Walking or climbing stairs? 0  Dressing or bathing? 0  Doing errands, shopping? 0  Preparing Food and eating ? N  Using the Toilet? N  In the past six months, have you accidently leaked urine? N  Do you have problems with loss of bowel control? N  Managing your Medications? N  Managing your Finances? N  Housekeeping or managing your Housekeeping? N    Patient Care Team: Kotturi, Vinay K, MD as PCP - General (Family Medicine) Florencio Cara BIRCH, MD as Consulting Physician (Cardiology) Jaye Fallow, MD as Referring Physician (Ophthalmology) Cathlyn Seal, MD as Referring Physician (Dermatology) Verlinda Boas, PA-C (Orthopedic Surgery)  I have updated your Care Teams any recent Medical Services you may have received from other providers in the past year.     Assessment:   This is a routine wellness examination for [place].  Hearing/Vision screen Hearing Screening - Comments:: Denies hearing loss  Vision Screening - Comments:: Gets routine eye exams, Dr. Jaye, [place] [facility]   Goals Addressed               This Visit's Progress     Increase physical activity (pt-stated)         Depression Screen     11/18/2023   10:18 AM 11/11/2023    9:54 AM 11/09/2023    2:06 PM 06/15/2023    2:02 PM 06/04/2023    9:41 AM 12/15/2022    2:42 PM 11/12/2022    9:59 AM  PHQ 2/9 Scores  PHQ - 2 Score 0 0 0 0 0 0 0  PHQ- 9 Score 0 1  0 0 0 0    Fall Risk     11/18/2023   10:21 AM 11/11/2023    9:53 AM 11/09/2023    2:06 PM 06/15/2023    2:02 PM 06/04/2023    9:41 AM  Fall Risk   Falls in the past year? 0 0 0 1 1  Number falls in past yr: 0 0 0 0  0  Injury with Fall? 0 0 0 1 1  Risk for fall due to : No Fall Risks No Fall Risks No Fall Risks  No Fall Risks  Follow up Falls evaluation  completed Falls evaluation completed Falls evaluation completed  Falls evaluation completed    MEDICARE RISK AT HOME:  Medicare Risk at Home Any stairs in or around the home?: Yes (1 step) If so, are there any without handrails?: Yes (1 step, no handrail) Home free of loose throw rugs in walkways, pet beds, electrical cords, etc?: Yes Adequate lighting in your home to reduce risk of falls?: Yes Life alert?: No Use of a cane, walker or w/c?: No Grab bars in the bathroom?: Yes Shower chair or bench in shower?: Yes Elevated toilet seat or a handicapped toilet?: Yes  TIMED UP AND GO:  Was the test performed?  No  Cognitive Function: 6CIT completed        11/18/2023   10:22 AM 11/12/2022   10:02 AM 11/05/2021    2:01 PM 07/30/2020   11:29 AM 07/27/2019   11:30 AM  6CIT Screen  What Year? 0 points 0 points 0 points 0 points 0 points  What month? 0 points 0 points 0 points 0 points 0 points  What time? 0 points 0 points 0 points 0 points 0 points  Count back from 20 0 points 0 points 0 points 0 points 0 points  Months in reverse 0 points 2 points 0 points 0 points 4 points  Repeat phrase 0 points 0 points 0 points 2 points 0 points  Total Score 0 points 2 points 0 points 2 points 4 points    Immunizations Immunization History  Administered Date(s) Administered   Fluad Quad(high Dose 65+) 12/20/2019, 11/23/2020   Fluad Trivalent(High Dose 65+) 12/15/2022   INFLUENZA, HIGH DOSE SEASONAL PF 12/24/2016, 12/25/2017, 11/15/2018   Influenza,inj,Quad PF,6+ Mos 11/22/2014, 11/26/2021   Influenza-Unspecified 11/23/2014, 11/15/2018   PFIZER(Purple Top)SARS-COV-2 Vaccination 03/31/2019, 04/21/2019, 01/30/2020   Pneumococcal Conjugate-13 12/31/2017   Pneumococcal Polysaccharide-23 01/31/2019   Td 10/19/2021   Zoster Recombinant(Shingrix) 12/04/2020, 05/29/2021    Screening Tests Health Maintenance  Topic Date Due   COVID-19 Vaccine (4 - 2024-25 season) 11/24/2023 (Originally 11/23/2022)    INFLUENZA VACCINE  06/21/2024 (Originally 10/23/2023)   Medicare Annual Wellness (AWV)  11/17/2024   DTaP/Tdap/Td (2 - Tdap) 10/20/2031   Pneumococcal Vaccine: 50+ Years  Completed   Zoster Vaccines- Shingrix  Completed   HPV VACCINES  Aged Out   Meningococcal B Vaccine  Aged Out    Health Maintenance  There are no preventive care reminders to display for this patient.  Health Maintenance Items Addressed: See Nurse Notes at the end of this note  Additional Screening:  Vision Screening: Recommended annual ophthalmology exams for early detection of glaucoma and other disorders of the eye. Would you like a referral to an eye doctor? No    Dental Screening: Recommended annual dental exams for proper oral hygiene  Community Resource Referral / Chronic Care Management: CRR required this visit?  No   CCM required this visit?  No   Plan:    I have personally reviewed and noted the following in the patient's chart:   Medical and social history Use of alcohol, tobacco or illicit drugs  Current medications and supplements including opioid prescriptions. Patient is not currently taking opioid prescriptions. Functional ability and status Nutritional status Physical activity Advanced directives List of other physicians Hospitalizations, surgeries, and ER visits in previous  12 months Vitals Screenings to include cognitive, depression, and falls Referrals and appointments  In addition, I have reviewed and discussed with patient certain preventive protocols, quality metrics, and best practice recommendations. A written personalized care plan for preventive services as well as general preventive health recommendations were provided to patient.   Vina Ned, CMA   11/18/2023   After Visit Summary: (Mail) Due to this being a telephonic visit, the after visit summary with patients personalized plan was offered to patient via mail   Notes:  Flu shot in the fall Declined Covid  vaccine Screening colonoscopy no longer recommended due to age

## 2023-11-18 NOTE — Patient Instructions (Signed)
 Mr. Stuart Jenkins , Thank you for taking time out of your busy schedule to complete your Annual Wellness Visit with me. I enjoyed our conversation and look forward to speaking with you again next year. I, as well as your care team,  appreciate your ongoing commitment to your health goals. Please review the following plan we discussed and let me know if I can assist you in the future. Your Game plan/ To Do List    Referrals: None   Follow up Visits: We will see or speak with you next year for your Next Medicare AWV with our clinical staff Have you seen your provider in the last 6 months (3 months if uncontrolled diabetes)? Yes  Clinician Recommendations: Get the flu vaccine at your convenience.  Aim for 30 minutes of exercise or brisk walking, 6-8 glasses of water, and 5 servings of fruits and vegetables each day.       This is a list of the screenings recommended for you:  Health Maintenance  Topic Date Due   COVID-19 Vaccine (4 - 2024-25 season) 11/24/2023*   Flu Shot  06/21/2024*   Medicare Annual Wellness Visit  11/17/2024   DTaP/Tdap/Td vaccine (2 - Tdap) 10/20/2031   Pneumococcal Vaccine for age over 81  Completed   Zoster (Shingles) Vaccine  Completed   HPV Vaccine  Aged Out   Meningitis B Vaccine  Aged Out  *Topic was postponed. The date shown is not the original due date.    Advanced directives: (Copy Requested) Please bring a copy of your health care power of attorney and living will to the office to be added to your chart at your convenience. You can mail to [facility] [address] [place], [place] [address] or email to [email or web address] Advance Care Planning is important because it:  [x]  Makes sure you receive the medical care that is consistent with your values, goals, and preferences  [x]  It provides guidance to your family and loved ones and reduces their decisional burden about whether or not they are making the right decisions based on your  wishes.  Follow the link provided in your after visit summary or read over the paperwork we have mailed to you to help you started getting your Advance Directives in place. If you need assistance in completing these, please reach out to us  so that we can help you!  See attachments for Preventive Care and Fall Prevention Tips.   Fall Prevention in the Home, Adult Falls can cause injuries and affect people of all ages. There are many simple things that you can do to make your home safe and to help prevent falls. If you need it, ask for help making these changes. What actions can I take to prevent falls? General information Use good lighting in all rooms. Make sure to: Replace any light bulbs that burn out. Turn on lights if it is dark and use night-lights. Keep items that you use often in easy-to-reach places. Lower the shelves around your home if needed. Move furniture so that there are clear paths around it. Do not keep throw rugs or other things on the floor that can make you trip. If any of your floors are uneven, fix them. Add color or contrast paint or tape to clearly mark and help you see: Grab bars or handrails. First and last steps of staircases. Where the edge of each step is. If you use a ladder or stepladder: Make sure that it is fully opened. Do  not climb a closed ladder. Make sure the sides of the ladder are locked in place. Have someone hold the ladder while you use it. Know where your pets are as you move through your home. What can I do in the bathroom?     Keep the floor dry. Clean up any water that is on the floor right away. Remove soap buildup in the bathtub or shower. Buildup makes bathtubs and showers slippery. Use non-skid mats or decals on the floor of the bathtub or shower. Attach bath mats securely with double-sided, non-slip rug tape. If you need to sit down while you are in the shower, use a non-slip stool. Install grab bars by the toilet and in the  bathtub and shower. Do not use towel bars as grab bars. What can I do in the bedroom? Make sure that you have a light by your bed that is easy to reach. Do not use any sheets or blankets on your bed that hang to the floor. Have a firm bench or chair with side arms that you can use for support when you get dressed. What can I do in the kitchen? Clean up any spills right away. If you need to reach something above you, use a sturdy step stool that has a grab bar. Keep electrical cables out of the way. Do not use floor polish or wax that makes floors slippery. What can I do with my stairs? Do not leave anything on the stairs. Make sure that you have a light switch at the top and the bottom of the stairs. Have them installed if you do not have them. Make sure that there are handrails on both sides of the stairs. Fix handrails that are broken or loose. Make sure that handrails are as long as the staircases. Install non-slip stair treads on all stairs in your home if they do not have carpet. Avoid having throw rugs at the top or bottom of stairs, or secure the rugs with carpet tape to prevent them from moving. Choose a carpet design that does not hide the edge of steps on the stairs. Make sure that carpet is firmly attached to the stairs. Fix any carpet that is loose or worn. What can I do on the outside of my home? Use bright outdoor lighting. Repair the edges of walkways and driveways and fix any cracks. Clear paths of anything that can make you trip, such as tools or rocks. Add color or contrast paint or tape to clearly mark and help you see high doorway thresholds. Trim any bushes or trees on the main path into your home. Check that handrails are securely fastened and in good repair. Both sides of all steps should have handrails. Install guardrails along the edges of any raised decks or porches. Have leaves, snow, and ice cleared regularly. Use sand, salt, or ice melt on walkways during winter  months if you live where there is ice and snow. In the garage, clean up any spills right away, including grease or oil spills. What other actions can I take? Review your medicines with your health care provider. Some medicines can make you confused or feel dizzy. This can increase your chance of falling. Wear closed-toe shoes that fit well and support your feet. Wear shoes that have rubber soles and low heels. Use a cane, walker, scooter, or crutches that help you move around if needed. Talk with your provider about other ways that you can decrease your risk of falls. This may  include seeing a physical therapist to learn to do exercises to improve movement and strength. Where to find more information Centers for Disease Control and Prevention, STEADI: TonerPromos.no [employer] on Aging: BaseRingTones.pl National Institute on Aging: BaseRingTones.pl Contact a health care provider if: You are afraid of falling at home. You feel weak, drowsy, or dizzy at home. You fall at home. Get help right away if you: Lose consciousness or have trouble moving after a fall. Have a fall that causes a head injury. These symptoms may be an emergency. Get help right away. Call 911. Do not wait to see if the symptoms will go away. Do not drive yourself to the hospital. This information is not intended to replace advice given to you by your health care provider. Make sure you discuss any questions you have with your health care provider. Document Revised: 11/11/2021 Document Reviewed: 11/11/2021 Elsevier Patient Education  2024 [employer].

## 2023-11-25 ENCOUNTER — Telehealth: Payer: Self-pay

## 2023-11-25 ENCOUNTER — Ambulatory Visit: Payer: Self-pay | Admitting: Family Medicine

## 2023-11-25 NOTE — Telephone Encounter (Signed)
 Please see MRI report, I have left comments to review with patient if he calls back (we lost connection over phone)  Thanks, JJM

## 2023-11-25 NOTE — Telephone Encounter (Signed)
 Hi Dr.Matthews, Could you please address this thanks.

## 2023-11-25 NOTE — Telephone Encounter (Signed)
 Copied from CRM #[id number]. Topic: Clinical - Lab/Test Results >> Nov 25, 2023 10:26 AM DeAngela L wrote: Reason for CRM: Patient would like a call back about MRI results from 11/17/23   Pt num [phone] (M)

## 2023-11-25 NOTE — Telephone Encounter (Signed)
 Please review CRM.

## 2023-11-25 NOTE — Progress Notes (Signed)
 Called and spoke to patient today about knee MRI - call lost signal before we could complete the review and I could not reach him when calling back.  If he calls back, please let him know the following:  - Your left knee shows wear-and-tear arthritis, mainly on the outer side and behind the kneecap, with some spots where the cartilage is completely worn away; this explains pain, swelling, and grinding. - All major ligaments (ACL, PCL, MCL, LCL) are intact. - The outer meniscus has a chronic degenerative tear, and the inner meniscus has a very small, likely stable tear - both common with arthritis. - There is a small amount of joint fluid and a small Baker's (popliteal) cyst, which often occur with arthritis. - No reported issues with the surgical hardware.  - For treatment, an unloader brace (a custom brace that can be used to take pressure off of the outer knee joint where the cartilage is worn out).  We could also seek authorization from your insurance company for gel injections (viscosupplementation) to help lubricate the joint and reduce pain.  Surgery is typically reserved as a last ditch effort if other methods have failed.

## 2023-11-26 ENCOUNTER — Telehealth: Payer: Self-pay

## 2023-11-26 NOTE — Telephone Encounter (Signed)
 Copied from CRM [id number]. Topic: Clinical - Lab/Test Results >> Nov 26, 2023  9:47 AM Berwyn MATSU wrote: Reason for CRM: patient called in requesting for MD to call him back with MRI results of the knee. Patient is requesting to speak to MD.   May you please assist.

## 2023-11-27 NOTE — Progress Notes (Signed)
 Telephone call encounter states that Dr. Alvia addressed with pt.  Stuart Jenkins

## 2023-11-30 ENCOUNTER — Ambulatory Visit (INDEPENDENT_AMBULATORY_CARE_PROVIDER_SITE_OTHER): Admitting: Family Medicine

## 2023-11-30 ENCOUNTER — Telehealth: Payer: Self-pay | Admitting: Family Medicine

## 2023-11-30 ENCOUNTER — Encounter: Payer: Self-pay | Admitting: Family Medicine

## 2023-11-30 VITALS — BP 152/80 | HR 72 | Ht 70.0 in | Wt 157.0 lb

## 2023-11-30 DIAGNOSIS — M175 Other unilateral secondary osteoarthritis of knee: Secondary | ICD-10-CM | POA: Diagnosis not present

## 2023-11-30 NOTE — Patient Instructions (Signed)
 Left Knee Osteoarthritis Care  - Authorization for gel injections has been requested; you will be contacted once approval is received. - Use a hinged knee brace with a compression sleeve for added support and to help reduce pressure on the knee. - Rest the knee as needed to manage pain.  When to Seek Immediate Medical Attention  - Sudden severe knee pain or swelling - Inability to move or bear weight on the knee - Signs of infection such as redness, warmth, or fever  Continue to monitor your symptoms and follow up as directed.

## 2023-11-30 NOTE — Assessment & Plan Note (Addendum)
 History of Present Illness Stuart Jenkins is an 83 year old male with severe knee arthritis who presents with persistent knee pain.  Left knee pain - Persistent pain localized primarily to the LEFT knee - Pain has remained unchanged over time - Exacerbated by movement and even minimal activity - Associated with a 'pop' and gritty sensation during knee flexion - Describes a sensation of degenerative edema, likened to a 'bone bruise' - Frequently stays off his feet to manage pain  Prior interventions and supportive measures - Received previous cortisone injections with limited relief - Wears a copper knee brace daily for comfort, though acknowledges minimal impact on pain  Recent imaging - MRI of the left knee performed two weeks ago for further evaluation of persistent pain  Results RADIOLOGY Left knee MRI: Moderate to severe lateral compartment chondromalacia with large areas of full-thickness defect, degenerative edema, mild to moderate patellofemoral chondromalacia, chronic tearing of the anterior horn and body of the lateral meniscus, small non-displaced tear of the medial meniscus, severe osteoarthritis with bone bruising. (11/16/2023)  Assessment and Plan Left knee osteoarthritis with osteoarthritis, bone marrow edema, and degenerative lateral meniscus tear Moderate to severe osteoarthritis in the left knee, particularly affecting the lateral compartment with chondromalacia and large areas of full-thickness cartilage defect. Bone marrow edema indicates bone bruising, contributing to persistent pain despite cortisone injection. Degenerative lateral meniscus tear is present but not the primary pain source. Pain primarily attributed to arthritis and bone bruising. - Submit authorization request for gel injections for temporary relief by lubricating the joint and alleviating symptoms for about six months. - Advise use of a hinged brace with a compression sleeve for support to offload  pressure from the affected area. - Contact him once gel injection authorization is obtained.

## 2023-11-30 NOTE — Progress Notes (Signed)
 Primary Care / Sports Medicine Office Visit  Patient Information:  Patient ID: Stuart Jenkins, male DOB: Nov 28, 1940 Age: 83 y.o. MRN: [id number]   Stuart Jenkins is a pleasant 83 y.o. male presenting with the following:  Chief Complaint  Patient presents with   Results    Pt wants to follow up and discuss recent MRI of Left Knee.    Vitals:   11/30/23 1557  BP: (!) 152/80  Pulse: 72  SpO2: 97%   Vitals:   11/30/23 1557  Weight: 157 lb (71.2 kg)  Height: 5' 10 (1.778 m)   Body mass index is 22.53 kg/m.  DG Knee Complete 4 Views Left Result Date: 11/21/2023 CLINICAL DATA:  Joint pain EXAM: LEFT KNEE - COMPLETE 4+ VIEW COMPARISON:  None Available. FINDINGS: Horizontal screws seen in the tibial plateau. No evidence for hardware loosening. The bones are osteopenic. No acute fracture or malalignment identified. There is moderate severe lateral compartment joint space narrowing. There is no joint effusion. Peripheral vascular calcifications are present. IMPRESSION: 1. No acute fracture or malalignment. 2. Moderate to severe lateral compartment joint space narrowing compatible with degenerative change. Electronically Signed   By: Greig Pique M.D.   On: 11/21/2023 20:15   MR Knee Left  Wo Contrast Result Date: 11/17/2023 EXAM DESCRIPTION: MR KNEE LEFT WO CONTRAST CLINICAL HISTORY: Knee trauma, internal derangement suspected, xray done; METAL REDUCTION PROTOCOL, acute on chronic pain, eval lateral meniscus, prior hardware COMPARISON: None Available. TECHNIQUE: MRI of the knee is performed according to our usual protocol with multiplanar multi sequence imaging. FINDINGS: The anterior cruciate ligament and posterior cruciate ligament are intact. The medial collateral ligament and lateral collateral ligament are intact. There is a very small tear to the posterior horn of the medial meniscus involving the undersurface. Chronic degenerative tear to the anterior horn and body of the  lateral meniscus. Postoperative change to the proximal tibia slightly limiting evaluation. Moderate to severe lateral compartment chondromalacia with large areas of full-thickness defect. Mild degenerative edema. Mild-to-moderate patellofemoral compartment chondromalacia with full-thickness defects to the lateral facet and lateral trochlear cartilage. Mild degenerative edema. Small joint effusion and small popliteal cyst. IMPRESSION: Moderate to severe lateral compartment chondromalacia with large areas of full-thickness defect and mild degenerative edema. Mild to moderate patellofemoral compartment chondromalacia with areas of full-thickness defect to the lateral patella and lateral trochlear cartilage. Mild degenerative edema. Chronic degenerative tear to the anterior horn and body of the lateral meniscus. Very small nondisplaced tear to the posterior horn of the medial meniscus. Small joint effusion and small popliteal cyst. Electronically signed by: Reyes Frees MD 11/17/2023 04:15 PM EDT RP Workstation: [id number]     Discussed the use of AI scribe software for clinical note transcription with the patient, who gave verbal consent to proceed.   Independent interpretation of notes and tests performed by another provider:   See below  Procedures performed:   None  Pertinent History, Exam, Impression, and Recommendations:   Problem List Items Addressed This Visit     Osteoarthritis of left knee - Primary   History of Present Illness Stuart Jenkins is an 83 year old male with severe knee arthritis who presents with persistent knee pain.  Left knee pain - Persistent pain localized primarily to the LEFT knee - Pain has remained unchanged over time - Exacerbated by movement and even minimal activity - Associated with a 'pop' and gritty sensation during knee flexion - Describes a sensation of degenerative edema,  likened to a 'bone bruise' - Frequently stays off his feet to manage  pain  Prior interventions and supportive measures - Received previous cortisone injections with limited relief - Wears a copper knee brace daily for comfort, though acknowledges minimal impact on pain  Recent imaging - MRI of the left knee performed two weeks ago for further evaluation of persistent pain  Results RADIOLOGY Left knee MRI: Moderate to severe lateral compartment chondromalacia with large areas of full-thickness defect, degenerative edema, mild to moderate patellofemoral chondromalacia, chronic tearing of the anterior horn and body of the lateral meniscus, small non-displaced tear of the medial meniscus, severe osteoarthritis with bone bruising. (11/16/2023)  Assessment and Plan Left knee osteoarthritis with osteoarthritis, bone marrow edema, and degenerative lateral meniscus tear Moderate to severe osteoarthritis in the left knee, particularly affecting the lateral compartment with chondromalacia and large areas of full-thickness cartilage defect. Bone marrow edema indicates bone bruising, contributing to persistent pain despite cortisone injection. Degenerative lateral meniscus tear is present but not the primary pain source. Pain primarily attributed to arthritis and bone bruising. - Submit authorization request for gel injections for temporary relief by lubricating the joint and alleviating symptoms for about six months. - Advise use of a hinged brace with a compression sleeve for support to offload pressure from the affected area. - Contact him once gel injection authorization is obtained.      A total of 36 minutes was spent on the date of service, 11/30/2023, encompassing both face-to-face and non-face-to-face time. This included review of prior records and imaging (e.g., MRI and/or radiographs), medical chart review, information gathering, documentation, care coordination with clinic staff, discussion and counseling with the patient regarding clinical findings and treatment  options, and planning for follow-up and next steps in management.   Orders & Medications Medications: No orders of the defined types were placed in this encounter.  No orders of the defined types were placed in this encounter.    No follow-ups on file.     Selinda JINNY Ku, MD, [facility]   Primary Care Sports Medicine Primary Care and Sports Medicine at [facility]

## 2023-12-01 NOTE — Telephone Encounter (Signed)
 Copied from CRM [id number]. Topic: Clinical - Prescription Issue >> Dec 01, 2023 12:38 PM Sophia H wrote: Reason for CRM: Spoke with [facility] who states they received an rx for Monovisc but missing signature & date range. Please fax over so they can fill for patient or can be uploaded to my [facility] portal.

## 2023-12-01 NOTE — Telephone Encounter (Signed)
 Completed. JM

## 2023-12-02 ENCOUNTER — Telehealth: Payer: Self-pay

## 2023-12-02 NOTE — Telephone Encounter (Signed)
 Completed. JM

## 2023-12-02 NOTE — Telephone Encounter (Signed)
 Completed on 12/02/23 and 12/01/23.  PA required. Will send to PA team.  JM   Copied from CRM [id number]. Topic: Clinical - Prescription Issue >> Dec 02, 2023 11:11 AM Deaijah H wrote: Reason for CRM: [place] w/ [facility] called in due to receiving script Monovisc with a plan, but stated they are not able to take verbal clarification needs to be a completed and signed enrollment form through [employer] portal.

## 2023-12-03 NOTE — Telephone Encounter (Unsigned)
 Copied from CRM #[id number]. Topic: Clinical - Prescription Issue >> Dec 03, 2023 11:19 AM Avram MATSU wrote: Reason for CRM: Monovsc request was sent to the pharmacy but was missing a completed PEF form that needs to be faxed back.   Fax:[phone]

## 2023-12-03 NOTE — Telephone Encounter (Signed)
 Completed yesterday

## 2023-12-07 ENCOUNTER — Other Ambulatory Visit (HOSPITAL_COMMUNITY): Payer: Self-pay

## 2023-12-17 ENCOUNTER — Other Ambulatory Visit: Payer: Self-pay

## 2023-12-17 DIAGNOSIS — N401 Enlarged prostate with lower urinary tract symptoms: Secondary | ICD-10-CM

## 2023-12-17 MED ORDER — TAMSULOSIN HCL 0.4 MG PO CAPS: 0.4000 mg | ORAL_CAPSULE | Freq: Every day | ORAL | 1 refills | Status: AC

## 2023-12-18 ENCOUNTER — Encounter: Payer: Self-pay | Admitting: Family Medicine

## 2023-12-18 ENCOUNTER — Ambulatory Visit (INDEPENDENT_AMBULATORY_CARE_PROVIDER_SITE_OTHER): Admitting: Family Medicine

## 2023-12-18 ENCOUNTER — Ambulatory Visit: Payer: Self-pay

## 2023-12-18 VITALS — BP 144/82 | HR 63 | Ht 70.0 in | Wt 155.0 lb

## 2023-12-18 DIAGNOSIS — M436 Torticollis: Secondary | ICD-10-CM

## 2023-12-18 DIAGNOSIS — I10 Essential (primary) hypertension: Secondary | ICD-10-CM

## 2023-12-18 DIAGNOSIS — Z23 Encounter for immunization: Secondary | ICD-10-CM | POA: Diagnosis not present

## 2023-12-18 DIAGNOSIS — N401 Enlarged prostate with lower urinary tract symptoms: Secondary | ICD-10-CM | POA: Diagnosis not present

## 2023-12-18 MED ORDER — TIZANIDINE HCL 2 MG PO TABS: 2.0000 mg | ORAL_TABLET | Freq: Four times a day (QID) | ORAL | 0 refills | Status: AC | PRN

## 2023-12-18 MED ORDER — PREDNISONE 20 MG PO TABS: 40.0000 mg | ORAL_TABLET | Freq: Every day | ORAL | 0 refills | Status: AC

## 2023-12-18 NOTE — Telephone Encounter (Signed)
 FYI Only or Action Required?: Action required by provider: request for appointment.  Patient was last seen in primary care on 11/30/2023 by Alvia Selinda PARAS, MD.  Called Nurse Triage reporting Neck Pain.  Symptoms began several weeks ago.  Interventions attempted: Nothing.  Symptoms are: unchanged. Neck pain, no injury.  Triage Disposition: See PCP When Office is Open (Within 3 Days)  Patient/caregiver understands and will follow disposition?: Yes    Copied from CRM #[id number]. Topic: Clinical - Red Word Triage >> Dec 18, 2023 11:14 AM Harlene ORN wrote: Red Word that prompted transfer to Nurse Triage: neck is hurting; getting worse in two weeks Reason for Disposition  [1] MODERATE neck pain (e.g., interferes with normal activities) AND [2] present > 3 days  Answer Assessment - Initial Assessment Questions 1. ONSET: When did the pain begin?      2 weeks 2. LOCATION: Where does it hurt?      Left side 3. PATTERN Does the pain come and go, or has it been constant since it started?      constant 4. SEVERITY: How bad is the pain?  (Scale 0-10; or none or slight stiffness, mild, moderate, severe)     7 5. RADIATION: Does the pain go anywhere else, shoot into your arms?     no 6. CORD SYMPTOMS: Any weakness or numbness of the arms or legs?     no 7. CAUSE: What do you think is causing the neck pain?     Unsure 8. NECK OVERUSE: Any recent activities that involved turning or twisting the neck?     no 9. OTHER SYMPTOMS: Do you have any other symptoms? (e.g., headache, fever, chest pain, difficulty breathing, neck swelling)     no 10. PREGNANCY: Is there any chance you are pregnant? When was your last menstrual period?       N/a  Protocols used: Neck Pain or Stiffness-A-AH

## 2023-12-18 NOTE — Progress Notes (Signed)
 Acute Office Visit  Subjective:     Patient ID: Stuart Jenkins, male    DOB: [birth year], 83 y.o.   MRN: [id number]  Chief Complaint  Patient presents with   Neck Pain    Left sharp pain on the side of neck, not taking any medications for it, at least 2 weeks     HPI Patient is in today for   Discussed the use of AI scribe software for clinical note transcription with the patient, who gave verbal consent to proceed.  History of Present Illness SEARS ORAN is an 83 year old male who presents with neck pain.  He has been experiencing neck pain for the past two weeks, localized to the left side of his neck. The pain is constant and is present with movement, including when turning his head to the right. The pain does not radiate. No numbness, tingling, or weakness in his hands is noted.  He has not taken any medication for the neck pain. He was advised to only use Tylenol  due to concerns related to his heart and stomach, but he has not yet used it.  He is currently taking Flomax  for urinary symptoms, which he finds effective. He also takes medication for high blood pressure every morning, although he notes fluctuations in his blood pressure.  No numbness, tingling, or weakness in his hands.    Review of Systems  All other systems reviewed and are negative.       Objective:    BP (!) 144/82   Pulse 63   Ht 5' 10 (1.778 m)   Wt 155 lb (70.3 kg)   SpO2 97%   BMI 22.24 kg/m    Physical Exam Vitals and nursing note reviewed.  Constitutional:      Appearance: Normal appearance.  HENT:     Head: Normocephalic.     Right Ear: External ear normal.     Left Ear: External ear normal.  Eyes:     Conjunctiva/sclera: Conjunctivae normal.  Cardiovascular:     Rate and Rhythm: Normal rate.  Pulmonary:     Effort: Pulmonary effort is normal. No respiratory distress.  Abdominal:     Palpations: Abdomen is soft.  Musculoskeletal:        General: Tenderness  present.  Skin:    General: Skin is warm.  Neurological:     Mental Status: He is alert and oriented to person, place, and time.  Psychiatric:        Mood and Affect: Mood normal.     No results found for any visits on 12/18/23.      Assessment & Plan:   Problem List Items Addressed This Visit       Cardiovascular and Mediastinum   Essential hypertension     Genitourinary   Benign prostatic hyperplasia with lower urinary tract symptoms   Other Visit Diagnoses       Torticollis    -  Primary   Relevant Medications   tiZANidine  (ZANAFLEX ) 2 MG tablet   predniSONE  (DELTASONE ) 20 MG tablet     Encounter for immunization       Relevant Orders   Flu vaccine HIGH DOSE PF(Fluzone Trivalent) (Completed)       Meds ordered this encounter  Medications   tiZANidine  (ZANAFLEX ) 2 MG tablet    Sig: Take 1 tablet (2 mg total) by mouth every 6 (six) hours as needed for muscle spasms.    Dispense:  30 tablet  Refill:  0   predniSONE  (DELTASONE ) 20 MG tablet    Sig: Take 2 tablets (40 mg total) by mouth daily for 5 days.    Dispense:  10 tablet    Refill:  0   Assessment and Plan Assessment & Plan Left neck muscle strain Pain localized to left neck, likely muscle strain. No neurological symptoms. No improvement. - Prescribed Zanaflex  for nighttime use, may use during the day if tolerated. - Consider prednisone  if Zanaflex  ineffective.  Essential hypertension Blood pressure elevated despite medication adherence.  Benign prostatic hyperplasia with lower urinary tract symptoms Flomax  effective for urinary symptoms. - Refill Flomax  prescription.   No follow-ups on file.  Vinary K Romonda Parker, MD

## 2024-01-04 ENCOUNTER — Other Ambulatory Visit: Payer: Self-pay

## 2024-01-04 DIAGNOSIS — Z87891 Personal history of nicotine dependence: Secondary | ICD-10-CM | POA: Diagnosis not present

## 2024-01-04 DIAGNOSIS — Z951 Presence of aortocoronary bypass graft: Secondary | ICD-10-CM | POA: Diagnosis not present

## 2024-01-04 DIAGNOSIS — R001 Bradycardia, unspecified: Secondary | ICD-10-CM | POA: Diagnosis not present

## 2024-01-04 DIAGNOSIS — I2510 Atherosclerotic heart disease of native coronary artery without angina pectoris: Secondary | ICD-10-CM | POA: Diagnosis not present

## 2024-01-04 DIAGNOSIS — I10 Essential (primary) hypertension: Secondary | ICD-10-CM | POA: Diagnosis not present

## 2024-01-04 DIAGNOSIS — E782 Mixed hyperlipidemia: Secondary | ICD-10-CM | POA: Diagnosis not present

## 2024-01-04 DIAGNOSIS — I959 Hypotension, unspecified: Secondary | ICD-10-CM | POA: Diagnosis not present

## 2024-01-04 MED ORDER — AMLODIPINE BESYLATE 2.5 MG PO TABS: 2.5000 mg | ORAL_TABLET | Freq: Every day | ORAL | 1 refills | Status: AC

## 2024-01-25 ENCOUNTER — Other Ambulatory Visit: Payer: Self-pay

## 2024-01-25 DIAGNOSIS — E782 Mixed hyperlipidemia: Secondary | ICD-10-CM

## 2024-01-25 MED ORDER — ATORVASTATIN CALCIUM 40 MG PO TABS: 40.0000 mg | ORAL_TABLET | Freq: Every day | ORAL | 1 refills | Status: AC

## 2024-03-14 NOTE — Progress Notes (Addendum)
 Stuart Jenkins                                          MRN: [id number]   03/14/2024   The [facility] Quality Team Specialist reviewed this patient medical record for the purposes of chart review for care gap closure. The following were reviewed: chart review for care gap closure-controlling blood pressure.  04/13/2024- cannot close cbp 2025   [facility] Quality Team

## 2024-05-18 ENCOUNTER — Ambulatory Visit: Admitting: Family Medicine

## 2024-11-23 ENCOUNTER — Ambulatory Visit
